# Patient Record
Sex: Male | Born: 1935 | Race: Black or African American | Hispanic: No | Marital: Married | Smoking: Former smoker
Health system: Southern US, Community
[De-identification: ages and names within clinical notes are randomized; demographics above are authoritative.]

## PROBLEM LIST (undated history)

## (undated) DIAGNOSIS — R471 Dysarthria and anarthria: Secondary | ICD-10-CM

## (undated) DIAGNOSIS — J449 Chronic obstructive pulmonary disease, unspecified: Secondary | ICD-10-CM

## (undated) DIAGNOSIS — Z720 Tobacco use: Secondary | ICD-10-CM

## (undated) DIAGNOSIS — I739 Peripheral vascular disease, unspecified: Secondary | ICD-10-CM

## (undated) DIAGNOSIS — E785 Hyperlipidemia, unspecified: Secondary | ICD-10-CM

## (undated) DIAGNOSIS — R0603 Acute respiratory distress: Secondary | ICD-10-CM

## (undated) DIAGNOSIS — F4310 Post-traumatic stress disorder, unspecified: Secondary | ICD-10-CM

## (undated) DIAGNOSIS — N400 Enlarged prostate without lower urinary tract symptoms: Secondary | ICD-10-CM

## (undated) DIAGNOSIS — N189 Chronic kidney disease, unspecified: Secondary | ICD-10-CM

## (undated) DIAGNOSIS — I10 Essential (primary) hypertension: Secondary | ICD-10-CM

## (undated) DIAGNOSIS — I2510 Atherosclerotic heart disease of native coronary artery without angina pectoris: Secondary | ICD-10-CM

## (undated) HISTORY — DX: Post-traumatic stress disorder, unspecified: F43.10

## (undated) HISTORY — DX: Tobacco use: Z72.0

## (undated) HISTORY — DX: Hyperlipidemia, unspecified: E78.5

## (undated) HISTORY — DX: Atherosclerotic heart disease of native coronary artery without angina pectoris: I25.10

## (undated) HISTORY — DX: Chronic obstructive pulmonary disease, unspecified: J44.9

## (undated) HISTORY — DX: Essential (primary) hypertension: I10

## (undated) HISTORY — PX: PROSTATE SURGERY: SHX751

## (undated) HISTORY — DX: Peripheral vascular disease, unspecified: I73.9

## (undated) HISTORY — DX: Chronic kidney disease, unspecified: N18.9

---

## 2012-12-31 ENCOUNTER — Ambulatory Visit (INDEPENDENT_AMBULATORY_CARE_PROVIDER_SITE_OTHER): Payer: PRIVATE HEALTH INSURANCE | Admitting: Cardiovascular Disease

## 2012-12-31 ENCOUNTER — Encounter: Payer: Self-pay | Admitting: Cardiovascular Disease

## 2012-12-31 VITALS — BP 162/75 | HR 71 | Ht 72.0 in | Wt 137.0 lb

## 2012-12-31 DIAGNOSIS — I739 Peripheral vascular disease, unspecified: Secondary | ICD-10-CM | POA: Insufficient documentation

## 2012-12-31 DIAGNOSIS — E785 Hyperlipidemia, unspecified: Secondary | ICD-10-CM

## 2012-12-31 NOTE — Assessment & Plan Note (Signed)
The patient was referred by Arnette Felts PA-C at [facility] medical for dilation of PAD. He complains of right ankle pain but denies claudication. He had a lower extremity arterial Dopplers recently performed at [facility] medical revealing ABI 0.5 range bilaterally with occluded SFAs. On exam his Bifemoral bruits absent pedal pulses. There is no evidence of skin breakdown or critical limb ischemia. Based on his the absence of symptoms of claudication and the atypical nature of his ankle pain I do not think this is vascular in nature nor do I take it requires further evaluation from a vascular point of view.

## 2012-12-31 NOTE — Progress Notes (Signed)
12/31/2012 Chase Johnson.   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 His risk factors include 70-pack-years of tobacco abuse having quit one month ago and treated hyperlipidemia. He's never had a heart attack or stroke and denies chest pain or shortness of breath. He does complain of right complaint pain but denies claudication. Arterial Doppler studies done in the office revealed an ABI of 0.5 bilaterally with occluded SFAs.   Current Outpatient Prescriptions  Medication Sig Dispense Refill  . ALBUTEROL IN Inhale into the lungs as needed.      . budesonide-formoterol (SYMBICORT) 160-4.5 MCG/ACT inhaler Inhale 2 puffs into the lungs 2 (two) times daily.      . clonazePAM (KLONOPIN) 1 MG tablet Take 1 mg by mouth at bedtime.      . hydrochlorothiazide (MICROZIDE) 12.5 MG capsule Take 12.5 mg by mouth daily.      . isosorbide mononitrate (IMDUR) 30 MG 24 hr tablet Take 30 mg by mouth daily.      . nitroGLYCERIN (NITROSTAT) 0.4 MG SL tablet Place 0.4 mg under the tongue every 5 (five) minutes as needed for chest pain.      . simvastatin (ZOCOR) 20 MG tablet Take 20 mg by mouth daily.      . tamsulosin (FLOMAX) 0.4 MG CAPS capsule Take 0.4 mg by mouth daily after supper.      . Vitamin D, Ergocalciferol, (DRISDOL) 50000 UNITS CAPS capsule Take 50,000 Units by mouth every 7 (seven) days.      Marland Kitchen zolpidem (AMBIEN CR) 12.5 MG CR tablet Take 12.5 mg by mouth at bedtime as needed for sleep.       No current facility-administered medications for this visit.    Not on File  History   Social History  . Marital Status: Married    Spouse  Name: N/A    Number of Children: N/A  . Years of Education: N/A   Occupational History  . Not on file.   Social History Main Topics  . Smoking status: Former Smoker    Types: Cigarettes    Quit date: 12/01/2012  . Smokeless tobacco: Not on file  . Alcohol Use: No  . Drug Use: Not on file  . Sexual Activity: Not on file   Other Topics Concern  . Not on file   Social History Narrative  . No narrative on file     Review of Systems: General: negative for chills, fever, night sweats or weight changes.  Cardiovascular: negative for chest pain, dyspnea on exertion, edema, orthopnea, palpitations, paroxysmal nocturnal dyspnea or shortness of breath Dermatological: negative for rash Respiratory: negative for cough or wheezing Urologic: negative for hematuria Abdominal: negative for nausea, vomiting, diarrhea, bright red blood per rectum, melena, or hematemesis Neurologic: negative for visual changes, syncope, or dizziness All other systems reviewed and are otherwise negative except as noted above.    Blood pressure 162/75, pulse 71, height 6' (1.829 m), weight 137 lb (62.143 kg).  General appearance: alert and no distress Neck: no adenopathy, no carotid bruit, no JVD, supple, symmetrical, trachea midline and thyroid not enlarged, symmetric, no tenderness/mass/nodules Lungs: clear to auscultation bilaterally Heart: regular rate  and rhythm, S1, S2 normal, no murmur, click, rub or gallop Abdomen: soft, non-tender; bowel sounds normal; no masses,  no organomegaly Extremities: extremities normal, atraumatic, no cyanosis or edema Pulses: 2+ and symmetric 2+ femorals with bruits bilaterally, absent pedal pulses  EKG not performed today  ASSESSMENT AND PLAN:   Peripheral arterial disease The patient was referred by Arnette Felts PA-C at [facility] medical for dilation of PAD. He complains of right ankle pain but denies claudication. He had a lower extremity arterial Dopplers recently  performed at [facility] medical revealing ABI 0.5 range bilaterally with occluded SFAs. On exam his Bifemoral bruits absent pedal pulses. There is no evidence of skin breakdown or critical limb ischemia. Based on his the absence of symptoms of claudication and the atypical nature of his ankle pain I do not think this is vascular in nature nor do I take it requires further evaluation from a vascular point of view.      Runell Gess MD FACP,FACC,FAHA, [facility] 12/31/2012 3:25 PM

## 2012-12-31 NOTE — Patient Instructions (Signed)
Dr Berry recommends that you follow-up with him on an as needed basis. 

## 2013-01-13 ENCOUNTER — Encounter: Payer: Self-pay | Admitting: Cardiovascular Disease

## 2014-01-19 DIAGNOSIS — J449 Chronic obstructive pulmonary disease, unspecified: Secondary | ICD-10-CM | POA: Diagnosis not present

## 2014-01-19 DIAGNOSIS — R0602 Shortness of breath: Secondary | ICD-10-CM | POA: Diagnosis not present

## 2014-01-19 DIAGNOSIS — G4733 Obstructive sleep apnea (adult) (pediatric): Secondary | ICD-10-CM | POA: Diagnosis not present

## 2014-01-19 DIAGNOSIS — R918 Other nonspecific abnormal finding of lung field: Secondary | ICD-10-CM | POA: Diagnosis not present

## 2014-01-20 DIAGNOSIS — R918 Other nonspecific abnormal finding of lung field: Secondary | ICD-10-CM | POA: Diagnosis not present

## 2014-01-27 DIAGNOSIS — J449 Chronic obstructive pulmonary disease, unspecified: Secondary | ICD-10-CM | POA: Diagnosis not present

## 2014-01-27 DIAGNOSIS — R918 Other nonspecific abnormal finding of lung field: Secondary | ICD-10-CM | POA: Diagnosis not present

## 2014-01-27 DIAGNOSIS — I2510 Atherosclerotic heart disease of native coronary artery without angina pectoris: Secondary | ICD-10-CM | POA: Diagnosis not present

## 2014-02-04 DIAGNOSIS — E782 Mixed hyperlipidemia: Secondary | ICD-10-CM | POA: Diagnosis not present

## 2014-02-04 DIAGNOSIS — I10 Essential (primary) hypertension: Secondary | ICD-10-CM | POA: Diagnosis not present

## 2014-02-04 DIAGNOSIS — Z79899 Other long term (current) drug therapy: Secondary | ICD-10-CM | POA: Diagnosis not present

## 2014-02-04 DIAGNOSIS — R0602 Shortness of breath: Secondary | ICD-10-CM | POA: Diagnosis not present

## 2014-02-04 DIAGNOSIS — G4700 Insomnia, unspecified: Secondary | ICD-10-CM | POA: Diagnosis not present

## 2014-02-08 DIAGNOSIS — I252 Old myocardial infarction: Secondary | ICD-10-CM | POA: Diagnosis not present

## 2014-02-08 DIAGNOSIS — J440 Chronic obstructive pulmonary disease with acute lower respiratory infection: Secondary | ICD-10-CM | POA: Diagnosis not present

## 2014-02-08 DIAGNOSIS — F17200 Nicotine dependence, unspecified, uncomplicated: Secondary | ICD-10-CM | POA: Diagnosis not present

## 2014-02-08 DIAGNOSIS — G4700 Insomnia, unspecified: Secondary | ICD-10-CM | POA: Diagnosis present

## 2014-02-08 DIAGNOSIS — R05 Cough: Secondary | ICD-10-CM | POA: Diagnosis not present

## 2014-02-08 DIAGNOSIS — E785 Hyperlipidemia, unspecified: Secondary | ICD-10-CM | POA: Diagnosis present

## 2014-02-08 DIAGNOSIS — R0600 Dyspnea, unspecified: Secondary | ICD-10-CM | POA: Diagnosis not present

## 2014-02-08 DIAGNOSIS — J9612 Chronic respiratory failure with hypercapnia: Secondary | ICD-10-CM | POA: Diagnosis not present

## 2014-02-08 DIAGNOSIS — I2781 Cor pulmonale (chronic): Secondary | ICD-10-CM | POA: Diagnosis not present

## 2014-02-08 DIAGNOSIS — Z8673 Personal history of transient ischemic attack (TIA), and cerebral infarction without residual deficits: Secondary | ICD-10-CM | POA: Diagnosis not present

## 2014-02-08 DIAGNOSIS — R2242 Localized swelling, mass and lump, left lower limb: Secondary | ICD-10-CM | POA: Diagnosis not present

## 2014-02-08 DIAGNOSIS — J449 Chronic obstructive pulmonary disease, unspecified: Secondary | ICD-10-CM | POA: Diagnosis not present

## 2014-02-08 DIAGNOSIS — R609 Edema, unspecified: Secondary | ICD-10-CM | POA: Diagnosis not present

## 2014-02-08 DIAGNOSIS — J441 Chronic obstructive pulmonary disease with (acute) exacerbation: Secondary | ICD-10-CM | POA: Diagnosis not present

## 2014-02-08 DIAGNOSIS — F419 Anxiety disorder, unspecified: Secondary | ICD-10-CM | POA: Diagnosis present

## 2014-02-08 DIAGNOSIS — K219 Gastro-esophageal reflux disease without esophagitis: Secondary | ICD-10-CM | POA: Diagnosis present

## 2014-02-08 DIAGNOSIS — N179 Acute kidney failure, unspecified: Secondary | ICD-10-CM | POA: Diagnosis not present

## 2014-02-08 DIAGNOSIS — J209 Acute bronchitis, unspecified: Secondary | ICD-10-CM | POA: Diagnosis present

## 2014-02-08 DIAGNOSIS — I10 Essential (primary) hypertension: Secondary | ICD-10-CM | POA: Diagnosis present

## 2014-02-08 DIAGNOSIS — F17210 Nicotine dependence, cigarettes, uncomplicated: Secondary | ICD-10-CM | POA: Diagnosis present

## 2014-02-08 DIAGNOSIS — I739 Peripheral vascular disease, unspecified: Secondary | ICD-10-CM | POA: Diagnosis present

## 2014-02-08 DIAGNOSIS — J9621 Acute and chronic respiratory failure with hypoxia: Secondary | ICD-10-CM | POA: Diagnosis not present

## 2014-02-08 DIAGNOSIS — M79605 Pain in left leg: Secondary | ICD-10-CM | POA: Diagnosis not present

## 2014-02-08 DIAGNOSIS — R0602 Shortness of breath: Secondary | ICD-10-CM | POA: Diagnosis not present

## 2014-02-24 DIAGNOSIS — R600 Localized edema: Secondary | ICD-10-CM | POA: Diagnosis not present

## 2014-02-24 DIAGNOSIS — E782 Mixed hyperlipidemia: Secondary | ICD-10-CM | POA: Diagnosis not present

## 2014-02-24 DIAGNOSIS — I10 Essential (primary) hypertension: Secondary | ICD-10-CM | POA: Diagnosis not present

## 2014-02-24 DIAGNOSIS — R0602 Shortness of breath: Secondary | ICD-10-CM | POA: Diagnosis not present

## 2014-02-28 DIAGNOSIS — I10 Essential (primary) hypertension: Secondary | ICD-10-CM | POA: Diagnosis not present

## 2014-02-28 DIAGNOSIS — I252 Old myocardial infarction: Secondary | ICD-10-CM | POA: Diagnosis not present

## 2014-02-28 DIAGNOSIS — J449 Chronic obstructive pulmonary disease, unspecified: Secondary | ICD-10-CM | POA: Diagnosis not present

## 2014-02-28 DIAGNOSIS — I499 Cardiac arrhythmia, unspecified: Secondary | ICD-10-CM | POA: Diagnosis not present

## 2014-02-28 DIAGNOSIS — R2242 Localized swelling, mass and lump, left lower limb: Secondary | ICD-10-CM | POA: Diagnosis not present

## 2014-02-28 DIAGNOSIS — E785 Hyperlipidemia, unspecified: Secondary | ICD-10-CM | POA: Diagnosis not present

## 2014-02-28 DIAGNOSIS — I2781 Cor pulmonale (chronic): Secondary | ICD-10-CM | POA: Diagnosis not present

## 2014-02-28 DIAGNOSIS — Z87820 Personal history of traumatic brain injury: Secondary | ICD-10-CM | POA: Diagnosis not present

## 2014-02-28 DIAGNOSIS — R609 Edema, unspecified: Secondary | ICD-10-CM | POA: Diagnosis not present

## 2014-03-09 DIAGNOSIS — J439 Emphysema, unspecified: Secondary | ICD-10-CM | POA: Diagnosis not present

## 2014-03-09 DIAGNOSIS — I509 Heart failure, unspecified: Secondary | ICD-10-CM | POA: Diagnosis not present

## 2014-03-09 DIAGNOSIS — R600 Localized edema: Secondary | ICD-10-CM | POA: Diagnosis not present

## 2014-03-13 DIAGNOSIS — R54 Age-related physical debility: Secondary | ICD-10-CM | POA: Diagnosis not present

## 2014-03-13 DIAGNOSIS — M19072 Primary osteoarthritis, left ankle and foot: Secondary | ICD-10-CM | POA: Diagnosis not present

## 2014-03-13 DIAGNOSIS — R0602 Shortness of breath: Secondary | ICD-10-CM | POA: Diagnosis not present

## 2014-03-13 DIAGNOSIS — M109 Gout, unspecified: Secondary | ICD-10-CM | POA: Diagnosis present

## 2014-03-13 DIAGNOSIS — E870 Hyperosmolality and hypernatremia: Secondary | ICD-10-CM | POA: Diagnosis present

## 2014-03-13 DIAGNOSIS — I5020 Unspecified systolic (congestive) heart failure: Secondary | ICD-10-CM | POA: Diagnosis not present

## 2014-03-13 DIAGNOSIS — I129 Hypertensive chronic kidney disease with stage 1 through stage 4 chronic kidney disease, or unspecified chronic kidney disease: Secondary | ICD-10-CM | POA: Diagnosis present

## 2014-03-13 DIAGNOSIS — N179 Acute kidney failure, unspecified: Secondary | ICD-10-CM | POA: Diagnosis not present

## 2014-03-13 DIAGNOSIS — N189 Chronic kidney disease, unspecified: Secondary | ICD-10-CM | POA: Diagnosis not present

## 2014-03-13 DIAGNOSIS — I5021 Acute systolic (congestive) heart failure: Secondary | ICD-10-CM | POA: Diagnosis not present

## 2014-03-13 DIAGNOSIS — I739 Peripheral vascular disease, unspecified: Secondary | ICD-10-CM | POA: Diagnosis present

## 2014-03-13 DIAGNOSIS — J9811 Atelectasis: Secondary | ICD-10-CM | POA: Diagnosis not present

## 2014-03-13 DIAGNOSIS — J9622 Acute and chronic respiratory failure with hypercapnia: Secondary | ICD-10-CM | POA: Diagnosis not present

## 2014-03-13 DIAGNOSIS — I10 Essential (primary) hypertension: Secondary | ICD-10-CM | POA: Diagnosis not present

## 2014-03-13 DIAGNOSIS — J441 Chronic obstructive pulmonary disease with (acute) exacerbation: Secondary | ICD-10-CM | POA: Diagnosis present

## 2014-03-13 DIAGNOSIS — M25579 Pain in unspecified ankle and joints of unspecified foot: Secondary | ICD-10-CM | POA: Diagnosis not present

## 2014-03-13 DIAGNOSIS — I509 Heart failure, unspecified: Secondary | ICD-10-CM | POA: Diagnosis not present

## 2014-03-13 DIAGNOSIS — Z8673 Personal history of transient ischemic attack (TIA), and cerebral infarction without residual deficits: Secondary | ICD-10-CM | POA: Diagnosis not present

## 2014-03-13 DIAGNOSIS — Z8711 Personal history of peptic ulcer disease: Secondary | ICD-10-CM | POA: Diagnosis not present

## 2014-03-13 DIAGNOSIS — G9340 Encephalopathy, unspecified: Secondary | ICD-10-CM | POA: Diagnosis not present

## 2014-03-13 DIAGNOSIS — J9620 Acute and chronic respiratory failure, unspecified whether with hypoxia or hypercapnia: Secondary | ICD-10-CM | POA: Diagnosis not present

## 2014-03-13 DIAGNOSIS — R5381 Other malaise: Secondary | ICD-10-CM | POA: Diagnosis present

## 2014-03-13 DIAGNOSIS — J189 Pneumonia, unspecified organism: Secondary | ICD-10-CM | POA: Diagnosis not present

## 2014-03-13 DIAGNOSIS — J90 Pleural effusion, not elsewhere classified: Secondary | ICD-10-CM | POA: Diagnosis not present

## 2014-03-13 DIAGNOSIS — N400 Enlarged prostate without lower urinary tract symptoms: Secondary | ICD-10-CM | POA: Diagnosis not present

## 2014-03-13 DIAGNOSIS — G9341 Metabolic encephalopathy: Secondary | ICD-10-CM | POA: Diagnosis not present

## 2014-03-13 DIAGNOSIS — R4182 Altered mental status, unspecified: Secondary | ICD-10-CM | POA: Diagnosis not present

## 2014-03-13 DIAGNOSIS — J9621 Acute and chronic respiratory failure with hypoxia: Secondary | ICD-10-CM | POA: Diagnosis not present

## 2014-03-13 DIAGNOSIS — I272 Other secondary pulmonary hypertension: Secondary | ICD-10-CM | POA: Diagnosis present

## 2014-03-13 DIAGNOSIS — E785 Hyperlipidemia, unspecified: Secondary | ICD-10-CM | POA: Diagnosis not present

## 2014-03-13 DIAGNOSIS — N183 Chronic kidney disease, stage 3 (moderate): Secondary | ICD-10-CM | POA: Diagnosis present

## 2014-03-13 DIAGNOSIS — M25572 Pain in left ankle and joints of left foot: Secondary | ICD-10-CM | POA: Diagnosis present

## 2014-03-13 DIAGNOSIS — J9600 Acute respiratory failure, unspecified whether with hypoxia or hypercapnia: Secondary | ICD-10-CM | POA: Diagnosis not present

## 2014-03-13 DIAGNOSIS — Z9981 Dependence on supplemental oxygen: Secondary | ICD-10-CM | POA: Diagnosis not present

## 2014-03-13 DIAGNOSIS — J449 Chronic obstructive pulmonary disease, unspecified: Secondary | ICD-10-CM | POA: Diagnosis not present

## 2014-03-13 DIAGNOSIS — I2510 Atherosclerotic heart disease of native coronary artery without angina pectoris: Secondary | ICD-10-CM | POA: Diagnosis not present

## 2014-03-19 DIAGNOSIS — E870 Hyperosmolality and hypernatremia: Secondary | ICD-10-CM | POA: Diagnosis not present

## 2014-03-19 DIAGNOSIS — F419 Anxiety disorder, unspecified: Secondary | ICD-10-CM | POA: Diagnosis present

## 2014-03-19 DIAGNOSIS — M25579 Pain in unspecified ankle and joints of unspecified foot: Secondary | ICD-10-CM | POA: Diagnosis not present

## 2014-03-19 DIAGNOSIS — I10 Essential (primary) hypertension: Secondary | ICD-10-CM | POA: Diagnosis not present

## 2014-03-19 DIAGNOSIS — Z9981 Dependence on supplemental oxygen: Secondary | ICD-10-CM | POA: Diagnosis not present

## 2014-03-19 DIAGNOSIS — J209 Acute bronchitis, unspecified: Secondary | ICD-10-CM | POA: Diagnosis present

## 2014-03-19 DIAGNOSIS — G9341 Metabolic encephalopathy: Secondary | ICD-10-CM | POA: Diagnosis present

## 2014-03-19 DIAGNOSIS — I5033 Acute on chronic diastolic (congestive) heart failure: Secondary | ICD-10-CM | POA: Diagnosis present

## 2014-03-19 DIAGNOSIS — E872 Acidosis: Secondary | ICD-10-CM | POA: Diagnosis present

## 2014-03-19 DIAGNOSIS — J9600 Acute respiratory failure, unspecified whether with hypoxia or hypercapnia: Secondary | ICD-10-CM | POA: Diagnosis not present

## 2014-03-19 DIAGNOSIS — E46 Unspecified protein-calorie malnutrition: Secondary | ICD-10-CM | POA: Diagnosis present

## 2014-03-19 DIAGNOSIS — Z681 Body mass index (BMI) 19 or less, adult: Secondary | ICD-10-CM | POA: Diagnosis not present

## 2014-03-19 DIAGNOSIS — I2510 Atherosclerotic heart disease of native coronary artery without angina pectoris: Secondary | ICD-10-CM | POA: Diagnosis not present

## 2014-03-19 DIAGNOSIS — J441 Chronic obstructive pulmonary disease with (acute) exacerbation: Secondary | ICD-10-CM | POA: Diagnosis present

## 2014-03-19 DIAGNOSIS — R0789 Other chest pain: Secondary | ICD-10-CM | POA: Diagnosis present

## 2014-03-19 DIAGNOSIS — N189 Chronic kidney disease, unspecified: Secondary | ICD-10-CM | POA: Diagnosis not present

## 2014-03-19 DIAGNOSIS — J9620 Acute and chronic respiratory failure, unspecified whether with hypoxia or hypercapnia: Secondary | ICD-10-CM | POA: Diagnosis not present

## 2014-03-19 DIAGNOSIS — J440 Chronic obstructive pulmonary disease with acute lower respiratory infection: Secondary | ICD-10-CM | POA: Diagnosis present

## 2014-03-19 DIAGNOSIS — J9602 Acute respiratory failure with hypercapnia: Secondary | ICD-10-CM | POA: Diagnosis not present

## 2014-03-19 DIAGNOSIS — T501X5A Adverse effect of loop [high-ceiling] diuretics, initial encounter: Secondary | ICD-10-CM | POA: Diagnosis not present

## 2014-03-19 DIAGNOSIS — J80 Acute respiratory distress syndrome: Secondary | ICD-10-CM | POA: Diagnosis not present

## 2014-03-19 DIAGNOSIS — I5020 Unspecified systolic (congestive) heart failure: Secondary | ICD-10-CM | POA: Diagnosis not present

## 2014-03-19 DIAGNOSIS — I739 Peripheral vascular disease, unspecified: Secondary | ICD-10-CM | POA: Diagnosis present

## 2014-03-19 DIAGNOSIS — M549 Dorsalgia, unspecified: Secondary | ICD-10-CM | POA: Diagnosis not present

## 2014-03-19 DIAGNOSIS — R109 Unspecified abdominal pain: Secondary | ICD-10-CM | POA: Diagnosis not present

## 2014-03-19 DIAGNOSIS — J9601 Acute respiratory failure with hypoxia: Secondary | ICD-10-CM | POA: Diagnosis not present

## 2014-03-19 DIAGNOSIS — J189 Pneumonia, unspecified organism: Secondary | ICD-10-CM | POA: Diagnosis not present

## 2014-03-19 DIAGNOSIS — F17210 Nicotine dependence, cigarettes, uncomplicated: Secondary | ICD-10-CM | POA: Diagnosis present

## 2014-03-19 DIAGNOSIS — J9622 Acute and chronic respiratory failure with hypercapnia: Secondary | ICD-10-CM | POA: Diagnosis present

## 2014-03-19 DIAGNOSIS — R4182 Altered mental status, unspecified: Secondary | ICD-10-CM | POA: Diagnosis not present

## 2014-03-19 DIAGNOSIS — Z8673 Personal history of transient ischemic attack (TIA), and cerebral infarction without residual deficits: Secondary | ICD-10-CM | POA: Diagnosis not present

## 2014-03-19 DIAGNOSIS — E875 Hyperkalemia: Secondary | ICD-10-CM | POA: Diagnosis present

## 2014-03-19 DIAGNOSIS — Z8701 Personal history of pneumonia (recurrent): Secondary | ICD-10-CM | POA: Diagnosis not present

## 2014-03-19 DIAGNOSIS — I252 Old myocardial infarction: Secondary | ICD-10-CM | POA: Diagnosis not present

## 2014-03-19 DIAGNOSIS — N179 Acute kidney failure, unspecified: Secondary | ICD-10-CM | POA: Diagnosis not present

## 2014-03-19 DIAGNOSIS — I129 Hypertensive chronic kidney disease with stage 1 through stage 4 chronic kidney disease, or unspecified chronic kidney disease: Secondary | ICD-10-CM | POA: Diagnosis present

## 2014-03-19 DIAGNOSIS — I509 Heart failure, unspecified: Secondary | ICD-10-CM | POA: Diagnosis not present

## 2014-03-19 DIAGNOSIS — N400 Enlarged prostate without lower urinary tract symptoms: Secondary | ICD-10-CM | POA: Diagnosis present

## 2014-03-19 DIAGNOSIS — N183 Chronic kidney disease, stage 3 (moderate): Secondary | ICD-10-CM | POA: Diagnosis present

## 2014-03-19 DIAGNOSIS — R069 Unspecified abnormalities of breathing: Secondary | ICD-10-CM | POA: Diagnosis not present

## 2014-03-19 DIAGNOSIS — R5381 Other malaise: Secondary | ICD-10-CM | POA: Diagnosis present

## 2014-03-19 DIAGNOSIS — R0600 Dyspnea, unspecified: Secondary | ICD-10-CM | POA: Diagnosis not present

## 2014-03-19 DIAGNOSIS — Z8711 Personal history of peptic ulcer disease: Secondary | ICD-10-CM | POA: Diagnosis not present

## 2014-03-19 DIAGNOSIS — E785 Hyperlipidemia, unspecified: Secondary | ICD-10-CM | POA: Diagnosis present

## 2014-03-19 DIAGNOSIS — G9340 Encephalopathy, unspecified: Secondary | ICD-10-CM | POA: Diagnosis not present

## 2014-03-19 DIAGNOSIS — R54 Age-related physical debility: Secondary | ICD-10-CM | POA: Diagnosis not present

## 2014-03-20 DIAGNOSIS — Z681 Body mass index (BMI) 19 or less, adult: Secondary | ICD-10-CM | POA: Diagnosis not present

## 2014-03-20 DIAGNOSIS — E870 Hyperosmolality and hypernatremia: Secondary | ICD-10-CM | POA: Diagnosis not present

## 2014-03-20 DIAGNOSIS — J9601 Acute respiratory failure with hypoxia: Secondary | ICD-10-CM | POA: Diagnosis not present

## 2014-03-20 DIAGNOSIS — F419 Anxiety disorder, unspecified: Secondary | ICD-10-CM | POA: Diagnosis present

## 2014-03-20 DIAGNOSIS — I252 Old myocardial infarction: Secondary | ICD-10-CM | POA: Diagnosis not present

## 2014-03-20 DIAGNOSIS — J189 Pneumonia, unspecified organism: Secondary | ICD-10-CM | POA: Diagnosis not present

## 2014-03-20 DIAGNOSIS — D649 Anemia, unspecified: Secondary | ICD-10-CM | POA: Diagnosis not present

## 2014-03-20 DIAGNOSIS — D696 Thrombocytopenia, unspecified: Secondary | ICD-10-CM | POA: Diagnosis not present

## 2014-03-20 DIAGNOSIS — J209 Acute bronchitis, unspecified: Secondary | ICD-10-CM | POA: Diagnosis present

## 2014-03-20 DIAGNOSIS — I5020 Unspecified systolic (congestive) heart failure: Secondary | ICD-10-CM | POA: Diagnosis not present

## 2014-03-20 DIAGNOSIS — N400 Enlarged prostate without lower urinary tract symptoms: Secondary | ICD-10-CM | POA: Diagnosis present

## 2014-03-20 DIAGNOSIS — R0600 Dyspnea, unspecified: Secondary | ICD-10-CM | POA: Diagnosis not present

## 2014-03-20 DIAGNOSIS — J9621 Acute and chronic respiratory failure with hypoxia: Secondary | ICD-10-CM | POA: Diagnosis not present

## 2014-03-20 DIAGNOSIS — T501X5A Adverse effect of loop [high-ceiling] diuretics, initial encounter: Secondary | ICD-10-CM | POA: Diagnosis not present

## 2014-03-20 DIAGNOSIS — J9620 Acute and chronic respiratory failure, unspecified whether with hypoxia or hypercapnia: Secondary | ICD-10-CM | POA: Diagnosis not present

## 2014-03-20 DIAGNOSIS — E46 Unspecified protein-calorie malnutrition: Secondary | ICD-10-CM | POA: Diagnosis present

## 2014-03-20 DIAGNOSIS — R079 Chest pain, unspecified: Secondary | ICD-10-CM | POA: Diagnosis not present

## 2014-03-20 DIAGNOSIS — J449 Chronic obstructive pulmonary disease, unspecified: Secondary | ICD-10-CM | POA: Diagnosis not present

## 2014-03-20 DIAGNOSIS — Z8701 Personal history of pneumonia (recurrent): Secondary | ICD-10-CM | POA: Diagnosis not present

## 2014-03-20 DIAGNOSIS — I129 Hypertensive chronic kidney disease with stage 1 through stage 4 chronic kidney disease, or unspecified chronic kidney disease: Secondary | ICD-10-CM | POA: Diagnosis present

## 2014-03-20 DIAGNOSIS — R069 Unspecified abnormalities of breathing: Secondary | ICD-10-CM | POA: Diagnosis not present

## 2014-03-20 DIAGNOSIS — J80 Acute respiratory distress syndrome: Secondary | ICD-10-CM | POA: Diagnosis not present

## 2014-03-20 DIAGNOSIS — I82A21 Chronic embolism and thrombosis of right axillary vein: Secondary | ICD-10-CM | POA: Diagnosis not present

## 2014-03-20 DIAGNOSIS — J9602 Acute respiratory failure with hypercapnia: Secondary | ICD-10-CM | POA: Diagnosis not present

## 2014-03-20 DIAGNOSIS — R4182 Altered mental status, unspecified: Secondary | ICD-10-CM | POA: Diagnosis not present

## 2014-03-20 DIAGNOSIS — E872 Acidosis: Secondary | ICD-10-CM | POA: Diagnosis present

## 2014-03-20 DIAGNOSIS — I509 Heart failure, unspecified: Secondary | ICD-10-CM | POA: Diagnosis not present

## 2014-03-20 DIAGNOSIS — I10 Essential (primary) hypertension: Secondary | ICD-10-CM | POA: Diagnosis not present

## 2014-03-20 DIAGNOSIS — M6281 Muscle weakness (generalized): Secondary | ICD-10-CM | POA: Diagnosis not present

## 2014-03-20 DIAGNOSIS — I5033 Acute on chronic diastolic (congestive) heart failure: Secondary | ICD-10-CM | POA: Diagnosis not present

## 2014-03-20 DIAGNOSIS — J441 Chronic obstructive pulmonary disease with (acute) exacerbation: Secondary | ICD-10-CM | POA: Diagnosis not present

## 2014-03-20 DIAGNOSIS — E875 Hyperkalemia: Secondary | ICD-10-CM | POA: Diagnosis present

## 2014-03-20 DIAGNOSIS — N179 Acute kidney failure, unspecified: Secondary | ICD-10-CM | POA: Diagnosis not present

## 2014-03-20 DIAGNOSIS — G9341 Metabolic encephalopathy: Secondary | ICD-10-CM | POA: Diagnosis present

## 2014-03-20 DIAGNOSIS — K219 Gastro-esophageal reflux disease without esophagitis: Secondary | ICD-10-CM | POA: Diagnosis not present

## 2014-03-20 DIAGNOSIS — E874 Mixed disorder of acid-base balance: Secondary | ICD-10-CM | POA: Diagnosis not present

## 2014-03-20 DIAGNOSIS — R609 Edema, unspecified: Secondary | ICD-10-CM | POA: Diagnosis not present

## 2014-03-20 DIAGNOSIS — M549 Dorsalgia, unspecified: Secondary | ICD-10-CM | POA: Diagnosis not present

## 2014-03-20 DIAGNOSIS — R109 Unspecified abdominal pain: Secondary | ICD-10-CM | POA: Diagnosis not present

## 2014-03-20 DIAGNOSIS — J9600 Acute respiratory failure, unspecified whether with hypoxia or hypercapnia: Secondary | ICD-10-CM | POA: Diagnosis not present

## 2014-03-20 DIAGNOSIS — R0602 Shortness of breath: Secondary | ICD-10-CM | POA: Diagnosis not present

## 2014-03-20 DIAGNOSIS — R5381 Other malaise: Secondary | ICD-10-CM | POA: Diagnosis present

## 2014-03-20 DIAGNOSIS — N183 Chronic kidney disease, stage 3 (moderate): Secondary | ICD-10-CM | POA: Diagnosis present

## 2014-03-20 DIAGNOSIS — F17210 Nicotine dependence, cigarettes, uncomplicated: Secondary | ICD-10-CM | POA: Diagnosis present

## 2014-03-20 DIAGNOSIS — Z79899 Other long term (current) drug therapy: Secondary | ICD-10-CM | POA: Diagnosis not present

## 2014-03-20 DIAGNOSIS — R0789 Other chest pain: Secondary | ICD-10-CM | POA: Diagnosis present

## 2014-03-20 DIAGNOSIS — Z9981 Dependence on supplemental oxygen: Secondary | ICD-10-CM | POA: Diagnosis not present

## 2014-03-20 DIAGNOSIS — I491 Atrial premature depolarization: Secondary | ICD-10-CM | POA: Diagnosis not present

## 2014-03-20 DIAGNOSIS — E785 Hyperlipidemia, unspecified: Secondary | ICD-10-CM | POA: Diagnosis present

## 2014-03-20 DIAGNOSIS — I739 Peripheral vascular disease, unspecified: Secondary | ICD-10-CM | POA: Diagnosis present

## 2014-03-20 DIAGNOSIS — J440 Chronic obstructive pulmonary disease with acute lower respiratory infection: Secondary | ICD-10-CM | POA: Diagnosis not present

## 2014-03-20 DIAGNOSIS — J9622 Acute and chronic respiratory failure with hypercapnia: Secondary | ICD-10-CM | POA: Diagnosis not present

## 2014-03-31 DIAGNOSIS — J441 Chronic obstructive pulmonary disease with (acute) exacerbation: Secondary | ICD-10-CM | POA: Diagnosis not present

## 2014-03-31 DIAGNOSIS — Z9981 Dependence on supplemental oxygen: Secondary | ICD-10-CM | POA: Diagnosis not present

## 2014-03-31 DIAGNOSIS — D649 Anemia, unspecified: Secondary | ICD-10-CM | POA: Diagnosis not present

## 2014-03-31 DIAGNOSIS — D696 Thrombocytopenia, unspecified: Secondary | ICD-10-CM | POA: Diagnosis not present

## 2014-03-31 DIAGNOSIS — J9601 Acute respiratory failure with hypoxia: Secondary | ICD-10-CM | POA: Diagnosis not present

## 2014-03-31 DIAGNOSIS — R0602 Shortness of breath: Secondary | ICD-10-CM | POA: Diagnosis not present

## 2014-03-31 DIAGNOSIS — J9621 Acute and chronic respiratory failure with hypoxia: Secondary | ICD-10-CM | POA: Diagnosis not present

## 2014-03-31 DIAGNOSIS — I82A21 Chronic embolism and thrombosis of right axillary vein: Secondary | ICD-10-CM | POA: Diagnosis not present

## 2014-04-01 DIAGNOSIS — J209 Acute bronchitis, unspecified: Secondary | ICD-10-CM | POA: Diagnosis not present

## 2014-04-01 DIAGNOSIS — J9602 Acute respiratory failure with hypercapnia: Secondary | ICD-10-CM | POA: Diagnosis not present

## 2014-04-01 DIAGNOSIS — I82A29 Chronic embolism and thrombosis of unspecified axillary vein: Secondary | ICD-10-CM | POA: Diagnosis not present

## 2014-04-01 DIAGNOSIS — J449 Chronic obstructive pulmonary disease, unspecified: Secondary | ICD-10-CM | POA: Diagnosis not present

## 2014-04-01 DIAGNOSIS — J441 Chronic obstructive pulmonary disease with (acute) exacerbation: Secondary | ICD-10-CM | POA: Diagnosis not present

## 2014-04-01 DIAGNOSIS — J9621 Acute and chronic respiratory failure with hypoxia: Secondary | ICD-10-CM | POA: Diagnosis not present

## 2014-04-02 DIAGNOSIS — J449 Chronic obstructive pulmonary disease, unspecified: Secondary | ICD-10-CM | POA: Diagnosis not present

## 2014-04-02 DIAGNOSIS — J9602 Acute respiratory failure with hypercapnia: Secondary | ICD-10-CM | POA: Diagnosis not present

## 2014-04-02 DIAGNOSIS — I82A29 Chronic embolism and thrombosis of unspecified axillary vein: Secondary | ICD-10-CM | POA: Diagnosis not present

## 2014-04-02 DIAGNOSIS — J441 Chronic obstructive pulmonary disease with (acute) exacerbation: Secondary | ICD-10-CM | POA: Diagnosis not present

## 2014-04-02 DIAGNOSIS — J209 Acute bronchitis, unspecified: Secondary | ICD-10-CM | POA: Diagnosis not present

## 2014-04-03 DIAGNOSIS — F419 Anxiety disorder, unspecified: Secondary | ICD-10-CM | POA: Diagnosis present

## 2014-04-03 DIAGNOSIS — G9341 Metabolic encephalopathy: Secondary | ICD-10-CM | POA: Diagnosis not present

## 2014-04-03 DIAGNOSIS — J449 Chronic obstructive pulmonary disease, unspecified: Secondary | ICD-10-CM | POA: Diagnosis not present

## 2014-04-03 DIAGNOSIS — Z720 Tobacco use: Secondary | ICD-10-CM | POA: Diagnosis not present

## 2014-04-03 DIAGNOSIS — J9612 Chronic respiratory failure with hypercapnia: Secondary | ICD-10-CM | POA: Diagnosis not present

## 2014-04-03 DIAGNOSIS — I739 Peripheral vascular disease, unspecified: Secondary | ICD-10-CM | POA: Diagnosis not present

## 2014-04-03 DIAGNOSIS — Z9981 Dependence on supplemental oxygen: Secondary | ICD-10-CM | POA: Diagnosis not present

## 2014-04-03 DIAGNOSIS — E785 Hyperlipidemia, unspecified: Secondary | ICD-10-CM | POA: Diagnosis present

## 2014-04-03 DIAGNOSIS — J9610 Chronic respiratory failure, unspecified whether with hypoxia or hypercapnia: Secondary | ICD-10-CM | POA: Diagnosis not present

## 2014-04-03 DIAGNOSIS — E870 Hyperosmolality and hypernatremia: Secondary | ICD-10-CM | POA: Diagnosis present

## 2014-04-03 DIAGNOSIS — Z86718 Personal history of other venous thrombosis and embolism: Secondary | ICD-10-CM | POA: Diagnosis not present

## 2014-04-03 DIAGNOSIS — F17210 Nicotine dependence, cigarettes, uncomplicated: Secondary | ICD-10-CM | POA: Diagnosis present

## 2014-04-03 DIAGNOSIS — I82721 Chronic embolism and thrombosis of deep veins of right upper extremity: Secondary | ICD-10-CM | POA: Diagnosis not present

## 2014-04-03 DIAGNOSIS — M6281 Muscle weakness (generalized): Secondary | ICD-10-CM | POA: Diagnosis present

## 2014-04-03 DIAGNOSIS — E874 Mixed disorder of acid-base balance: Secondary | ICD-10-CM | POA: Diagnosis present

## 2014-04-03 DIAGNOSIS — R5381 Other malaise: Secondary | ICD-10-CM | POA: Diagnosis present

## 2014-04-03 DIAGNOSIS — I82C21 Chronic embolism and thrombosis of right internal jugular vein: Secondary | ICD-10-CM | POA: Diagnosis present

## 2014-04-03 DIAGNOSIS — N19 Unspecified kidney failure: Secondary | ICD-10-CM | POA: Diagnosis not present

## 2014-04-03 DIAGNOSIS — R0602 Shortness of breath: Secondary | ICD-10-CM | POA: Diagnosis not present

## 2014-04-03 DIAGNOSIS — R2689 Other abnormalities of gait and mobility: Secondary | ICD-10-CM | POA: Diagnosis present

## 2014-04-03 DIAGNOSIS — D649 Anemia, unspecified: Secondary | ICD-10-CM | POA: Diagnosis present

## 2014-04-03 DIAGNOSIS — J9622 Acute and chronic respiratory failure with hypercapnia: Secondary | ICD-10-CM | POA: Diagnosis present

## 2014-04-03 DIAGNOSIS — I82A21 Chronic embolism and thrombosis of right axillary vein: Secondary | ICD-10-CM | POA: Diagnosis present

## 2014-04-03 DIAGNOSIS — G92 Toxic encephalopathy: Secondary | ICD-10-CM | POA: Diagnosis present

## 2014-04-03 DIAGNOSIS — I10 Essential (primary) hypertension: Secondary | ICD-10-CM | POA: Diagnosis present

## 2014-04-03 DIAGNOSIS — E46 Unspecified protein-calorie malnutrition: Secondary | ICD-10-CM | POA: Diagnosis not present

## 2014-04-03 DIAGNOSIS — D696 Thrombocytopenia, unspecified: Secondary | ICD-10-CM | POA: Diagnosis present

## 2014-04-03 DIAGNOSIS — J80 Acute respiratory distress syndrome: Secondary | ICD-10-CM | POA: Diagnosis not present

## 2014-04-03 DIAGNOSIS — M542 Cervicalgia: Secondary | ICD-10-CM | POA: Diagnosis not present

## 2014-04-03 DIAGNOSIS — J209 Acute bronchitis, unspecified: Secondary | ICD-10-CM | POA: Diagnosis not present

## 2014-04-03 DIAGNOSIS — R609 Edema, unspecified: Secondary | ICD-10-CM | POA: Diagnosis present

## 2014-04-03 DIAGNOSIS — K219 Gastro-esophageal reflux disease without esophagitis: Secondary | ICD-10-CM | POA: Diagnosis present

## 2014-04-03 DIAGNOSIS — J9621 Acute and chronic respiratory failure with hypoxia: Secondary | ICD-10-CM | POA: Diagnosis present

## 2014-04-03 DIAGNOSIS — I509 Heart failure, unspecified: Secondary | ICD-10-CM | POA: Diagnosis not present

## 2014-04-03 DIAGNOSIS — Z8673 Personal history of transient ischemic attack (TIA), and cerebral infarction without residual deficits: Secondary | ICD-10-CM | POA: Diagnosis not present

## 2014-04-03 DIAGNOSIS — W19XXXA Unspecified fall, initial encounter: Secondary | ICD-10-CM | POA: Diagnosis not present

## 2014-04-03 DIAGNOSIS — I2510 Atherosclerotic heart disease of native coronary artery without angina pectoris: Secondary | ICD-10-CM | POA: Diagnosis present

## 2014-04-03 DIAGNOSIS — Z7902 Long term (current) use of antithrombotics/antiplatelets: Secondary | ICD-10-CM | POA: Diagnosis not present

## 2014-04-03 DIAGNOSIS — J9602 Acute respiratory failure with hypercapnia: Secondary | ICD-10-CM | POA: Diagnosis not present

## 2014-04-03 DIAGNOSIS — N400 Enlarged prostate without lower urinary tract symptoms: Secondary | ICD-10-CM | POA: Diagnosis present

## 2014-04-03 DIAGNOSIS — T148 Other injury of unspecified body region: Secondary | ICD-10-CM | POA: Diagnosis not present

## 2014-04-03 DIAGNOSIS — E872 Acidosis: Secondary | ICD-10-CM | POA: Diagnosis present

## 2014-04-03 DIAGNOSIS — I209 Angina pectoris, unspecified: Secondary | ICD-10-CM | POA: Diagnosis not present

## 2014-04-03 DIAGNOSIS — I82A29 Chronic embolism and thrombosis of unspecified axillary vein: Secondary | ICD-10-CM | POA: Diagnosis not present

## 2014-04-03 DIAGNOSIS — J441 Chronic obstructive pulmonary disease with (acute) exacerbation: Secondary | ICD-10-CM | POA: Diagnosis not present

## 2014-04-04 DIAGNOSIS — E785 Hyperlipidemia, unspecified: Secondary | ICD-10-CM | POA: Diagnosis present

## 2014-04-04 DIAGNOSIS — F17210 Nicotine dependence, cigarettes, uncomplicated: Secondary | ICD-10-CM | POA: Diagnosis present

## 2014-04-04 DIAGNOSIS — Z86718 Personal history of other venous thrombosis and embolism: Secondary | ICD-10-CM | POA: Diagnosis not present

## 2014-04-04 DIAGNOSIS — E872 Acidosis: Secondary | ICD-10-CM | POA: Diagnosis present

## 2014-04-04 DIAGNOSIS — I509 Heart failure, unspecified: Secondary | ICD-10-CM | POA: Diagnosis not present

## 2014-04-04 DIAGNOSIS — E870 Hyperosmolality and hypernatremia: Secondary | ICD-10-CM | POA: Diagnosis present

## 2014-04-04 DIAGNOSIS — R2689 Other abnormalities of gait and mobility: Secondary | ICD-10-CM | POA: Diagnosis present

## 2014-04-04 DIAGNOSIS — G92 Toxic encephalopathy: Secondary | ICD-10-CM | POA: Diagnosis present

## 2014-04-04 DIAGNOSIS — F419 Anxiety disorder, unspecified: Secondary | ICD-10-CM | POA: Diagnosis present

## 2014-04-04 DIAGNOSIS — I82C21 Chronic embolism and thrombosis of right internal jugular vein: Secondary | ICD-10-CM | POA: Diagnosis not present

## 2014-04-04 DIAGNOSIS — R5381 Other malaise: Secondary | ICD-10-CM | POA: Diagnosis not present

## 2014-04-04 DIAGNOSIS — M6281 Muscle weakness (generalized): Secondary | ICD-10-CM | POA: Diagnosis present

## 2014-04-04 DIAGNOSIS — J9610 Chronic respiratory failure, unspecified whether with hypoxia or hypercapnia: Secondary | ICD-10-CM | POA: Diagnosis not present

## 2014-04-04 DIAGNOSIS — W19XXXA Unspecified fall, initial encounter: Secondary | ICD-10-CM | POA: Diagnosis not present

## 2014-04-04 DIAGNOSIS — D649 Anemia, unspecified: Secondary | ICD-10-CM | POA: Diagnosis present

## 2014-04-04 DIAGNOSIS — I82721 Chronic embolism and thrombosis of deep veins of right upper extremity: Secondary | ICD-10-CM | POA: Diagnosis not present

## 2014-04-04 DIAGNOSIS — J441 Chronic obstructive pulmonary disease with (acute) exacerbation: Secondary | ICD-10-CM | POA: Diagnosis not present

## 2014-04-04 DIAGNOSIS — R609 Edema, unspecified: Secondary | ICD-10-CM | POA: Diagnosis present

## 2014-04-04 DIAGNOSIS — G9341 Metabolic encephalopathy: Secondary | ICD-10-CM | POA: Diagnosis not present

## 2014-04-04 DIAGNOSIS — I82A21 Chronic embolism and thrombosis of right axillary vein: Secondary | ICD-10-CM | POA: Diagnosis present

## 2014-04-04 DIAGNOSIS — J449 Chronic obstructive pulmonary disease, unspecified: Secondary | ICD-10-CM | POA: Diagnosis not present

## 2014-04-04 DIAGNOSIS — J9622 Acute and chronic respiratory failure with hypercapnia: Secondary | ICD-10-CM | POA: Diagnosis present

## 2014-04-04 DIAGNOSIS — Z7902 Long term (current) use of antithrombotics/antiplatelets: Secondary | ICD-10-CM | POA: Diagnosis not present

## 2014-04-04 DIAGNOSIS — M542 Cervicalgia: Secondary | ICD-10-CM | POA: Diagnosis not present

## 2014-04-04 DIAGNOSIS — Z9981 Dependence on supplemental oxygen: Secondary | ICD-10-CM | POA: Diagnosis not present

## 2014-04-04 DIAGNOSIS — I2510 Atherosclerotic heart disease of native coronary artery without angina pectoris: Secondary | ICD-10-CM | POA: Diagnosis present

## 2014-04-04 DIAGNOSIS — J9612 Chronic respiratory failure with hypercapnia: Secondary | ICD-10-CM | POA: Diagnosis not present

## 2014-04-04 DIAGNOSIS — D696 Thrombocytopenia, unspecified: Secondary | ICD-10-CM | POA: Diagnosis present

## 2014-04-04 DIAGNOSIS — T148 Other injury of unspecified body region: Secondary | ICD-10-CM | POA: Diagnosis not present

## 2014-04-04 DIAGNOSIS — Z720 Tobacco use: Secondary | ICD-10-CM | POA: Diagnosis not present

## 2014-04-04 DIAGNOSIS — J9621 Acute and chronic respiratory failure with hypoxia: Secondary | ICD-10-CM | POA: Diagnosis present

## 2014-04-04 DIAGNOSIS — N400 Enlarged prostate without lower urinary tract symptoms: Secondary | ICD-10-CM | POA: Diagnosis present

## 2014-04-04 DIAGNOSIS — I10 Essential (primary) hypertension: Secondary | ICD-10-CM | POA: Diagnosis present

## 2014-04-04 DIAGNOSIS — K219 Gastro-esophageal reflux disease without esophagitis: Secondary | ICD-10-CM | POA: Diagnosis present

## 2014-04-04 DIAGNOSIS — E874 Mixed disorder of acid-base balance: Secondary | ICD-10-CM | POA: Diagnosis present

## 2014-04-04 DIAGNOSIS — R0602 Shortness of breath: Secondary | ICD-10-CM | POA: Diagnosis not present

## 2014-04-09 DIAGNOSIS — I491 Atrial premature depolarization: Secondary | ICD-10-CM | POA: Diagnosis not present

## 2014-04-09 DIAGNOSIS — I82629 Acute embolism and thrombosis of deep veins of unspecified upper extremity: Secondary | ICD-10-CM | POA: Diagnosis not present

## 2014-04-09 DIAGNOSIS — R1310 Dysphagia, unspecified: Secondary | ICD-10-CM | POA: Diagnosis present

## 2014-04-09 DIAGNOSIS — Z9981 Dependence on supplemental oxygen: Secondary | ICD-10-CM | POA: Diagnosis not present

## 2014-04-09 DIAGNOSIS — I10 Essential (primary) hypertension: Secondary | ICD-10-CM | POA: Diagnosis not present

## 2014-04-09 DIAGNOSIS — J9621 Acute and chronic respiratory failure with hypoxia: Secondary | ICD-10-CM | POA: Diagnosis not present

## 2014-04-09 DIAGNOSIS — F05 Delirium due to known physiological condition: Secondary | ICD-10-CM | POA: Diagnosis not present

## 2014-04-09 DIAGNOSIS — K219 Gastro-esophageal reflux disease without esophagitis: Secondary | ICD-10-CM | POA: Diagnosis not present

## 2014-04-09 DIAGNOSIS — I272 Other secondary pulmonary hypertension: Secondary | ICD-10-CM | POA: Diagnosis not present

## 2014-04-09 DIAGNOSIS — J9692 Respiratory failure, unspecified with hypercapnia: Secondary | ICD-10-CM | POA: Diagnosis not present

## 2014-04-09 DIAGNOSIS — E878 Other disorders of electrolyte and fluid balance, not elsewhere classified: Secondary | ICD-10-CM | POA: Diagnosis not present

## 2014-04-09 DIAGNOSIS — R0602 Shortness of breath: Secondary | ICD-10-CM | POA: Diagnosis not present

## 2014-04-09 DIAGNOSIS — Z86718 Personal history of other venous thrombosis and embolism: Secondary | ICD-10-CM | POA: Diagnosis not present

## 2014-04-09 DIAGNOSIS — E8770 Fluid overload, unspecified: Secondary | ICD-10-CM | POA: Diagnosis not present

## 2014-04-09 DIAGNOSIS — G4733 Obstructive sleep apnea (adult) (pediatric): Secondary | ICD-10-CM | POA: Diagnosis present

## 2014-04-09 DIAGNOSIS — J9622 Acute and chronic respiratory failure with hypercapnia: Secondary | ICD-10-CM | POA: Diagnosis not present

## 2014-04-09 DIAGNOSIS — Z7901 Long term (current) use of anticoagulants: Secondary | ICD-10-CM | POA: Diagnosis not present

## 2014-04-09 DIAGNOSIS — Z8673 Personal history of transient ischemic attack (TIA), and cerebral infarction without residual deficits: Secondary | ICD-10-CM | POA: Diagnosis not present

## 2014-04-09 DIAGNOSIS — R4182 Altered mental status, unspecified: Secondary | ICD-10-CM | POA: Diagnosis not present

## 2014-04-09 DIAGNOSIS — E872 Acidosis: Secondary | ICD-10-CM | POA: Diagnosis not present

## 2014-04-09 DIAGNOSIS — R0689 Other abnormalities of breathing: Secondary | ICD-10-CM | POA: Diagnosis not present

## 2014-04-09 DIAGNOSIS — I5030 Unspecified diastolic (congestive) heart failure: Secondary | ICD-10-CM | POA: Diagnosis not present

## 2014-04-09 DIAGNOSIS — I5033 Acute on chronic diastolic (congestive) heart failure: Secondary | ICD-10-CM | POA: Diagnosis not present

## 2014-04-09 DIAGNOSIS — R410 Disorientation, unspecified: Secondary | ICD-10-CM | POA: Diagnosis not present

## 2014-04-09 DIAGNOSIS — R7989 Other specified abnormal findings of blood chemistry: Secondary | ICD-10-CM | POA: Diagnosis not present

## 2014-04-09 DIAGNOSIS — R072 Precordial pain: Secondary | ICD-10-CM | POA: Diagnosis not present

## 2014-04-09 DIAGNOSIS — J441 Chronic obstructive pulmonary disease with (acute) exacerbation: Secondary | ICD-10-CM | POA: Diagnosis not present

## 2014-04-09 DIAGNOSIS — J9691 Respiratory failure, unspecified with hypoxia: Secondary | ICD-10-CM | POA: Diagnosis not present

## 2014-04-09 DIAGNOSIS — R079 Chest pain, unspecified: Secondary | ICD-10-CM | POA: Diagnosis not present

## 2014-04-09 DIAGNOSIS — D649 Anemia, unspecified: Secondary | ICD-10-CM | POA: Diagnosis not present

## 2014-04-09 DIAGNOSIS — I639 Cerebral infarction, unspecified: Secondary | ICD-10-CM | POA: Diagnosis not present

## 2014-04-09 DIAGNOSIS — I82A21 Chronic embolism and thrombosis of right axillary vein: Secondary | ICD-10-CM | POA: Diagnosis not present

## 2014-04-09 DIAGNOSIS — Z8249 Family history of ischemic heart disease and other diseases of the circulatory system: Secondary | ICD-10-CM | POA: Diagnosis not present

## 2014-04-09 DIAGNOSIS — F419 Anxiety disorder, unspecified: Secondary | ICD-10-CM | POA: Diagnosis not present

## 2014-04-09 DIAGNOSIS — F4310 Post-traumatic stress disorder, unspecified: Secondary | ICD-10-CM | POA: Diagnosis present

## 2014-04-09 DIAGNOSIS — J449 Chronic obstructive pulmonary disease, unspecified: Secondary | ICD-10-CM | POA: Diagnosis not present

## 2014-04-09 DIAGNOSIS — M6281 Muscle weakness (generalized): Secondary | ICD-10-CM | POA: Diagnosis not present

## 2014-04-09 DIAGNOSIS — Z9114 Patient's other noncompliance with medication regimen: Secondary | ICD-10-CM | POA: Diagnosis present

## 2014-04-09 DIAGNOSIS — R0902 Hypoxemia: Secondary | ICD-10-CM | POA: Diagnosis not present

## 2014-04-09 DIAGNOSIS — E785 Hyperlipidemia, unspecified: Secondary | ICD-10-CM | POA: Diagnosis not present

## 2014-04-09 DIAGNOSIS — R069 Unspecified abnormalities of breathing: Secondary | ICD-10-CM | POA: Diagnosis not present

## 2014-04-09 DIAGNOSIS — R0789 Other chest pain: Secondary | ICD-10-CM | POA: Diagnosis not present

## 2014-04-09 DIAGNOSIS — F17210 Nicotine dependence, cigarettes, uncomplicated: Secondary | ICD-10-CM | POA: Diagnosis present

## 2014-04-09 DIAGNOSIS — I2510 Atherosclerotic heart disease of native coronary artery without angina pectoris: Secondary | ICD-10-CM | POA: Diagnosis not present

## 2014-04-09 DIAGNOSIS — M7989 Other specified soft tissue disorders: Secondary | ICD-10-CM | POA: Diagnosis not present

## 2014-04-09 DIAGNOSIS — I5023 Acute on chronic systolic (congestive) heart failure: Secondary | ICD-10-CM | POA: Diagnosis not present

## 2014-04-09 DIAGNOSIS — N400 Enlarged prostate without lower urinary tract symptoms: Secondary | ICD-10-CM | POA: Diagnosis not present

## 2014-04-09 DIAGNOSIS — I248 Other forms of acute ischemic heart disease: Secondary | ICD-10-CM | POA: Diagnosis not present

## 2014-04-22 DIAGNOSIS — F0390 Unspecified dementia without behavioral disturbance: Secondary | ICD-10-CM | POA: Diagnosis not present

## 2014-04-22 DIAGNOSIS — R634 Abnormal weight loss: Secondary | ICD-10-CM | POA: Diagnosis not present

## 2014-04-22 DIAGNOSIS — I10 Essential (primary) hypertension: Secondary | ICD-10-CM | POA: Diagnosis not present

## 2014-04-22 DIAGNOSIS — J441 Chronic obstructive pulmonary disease with (acute) exacerbation: Secondary | ICD-10-CM | POA: Diagnosis not present

## 2014-04-22 DIAGNOSIS — I5033 Acute on chronic diastolic (congestive) heart failure: Secondary | ICD-10-CM | POA: Diagnosis not present

## 2014-04-22 DIAGNOSIS — R0602 Shortness of breath: Secondary | ICD-10-CM | POA: Diagnosis not present

## 2014-04-22 DIAGNOSIS — Z8673 Personal history of transient ischemic attack (TIA), and cerebral infarction without residual deficits: Secondary | ICD-10-CM | POA: Diagnosis not present

## 2014-04-22 DIAGNOSIS — F39 Unspecified mood [affective] disorder: Secondary | ICD-10-CM | POA: Diagnosis not present

## 2014-04-22 DIAGNOSIS — J9622 Acute and chronic respiratory failure with hypercapnia: Secondary | ICD-10-CM | POA: Diagnosis not present

## 2014-04-22 DIAGNOSIS — I82A21 Chronic embolism and thrombosis of right axillary vein: Secondary | ICD-10-CM | POA: Diagnosis not present

## 2014-04-22 DIAGNOSIS — J984 Other disorders of lung: Secondary | ICD-10-CM | POA: Diagnosis not present

## 2014-04-22 DIAGNOSIS — F22 Delusional disorders: Secondary | ICD-10-CM | POA: Diagnosis not present

## 2014-04-22 DIAGNOSIS — J9691 Respiratory failure, unspecified with hypoxia: Secondary | ICD-10-CM | POA: Diagnosis not present

## 2014-04-22 DIAGNOSIS — J9621 Acute and chronic respiratory failure with hypoxia: Secondary | ICD-10-CM | POA: Diagnosis not present

## 2014-04-22 DIAGNOSIS — Z9981 Dependence on supplemental oxygen: Secondary | ICD-10-CM | POA: Diagnosis not present

## 2014-04-22 DIAGNOSIS — M6281 Muscle weakness (generalized): Secondary | ICD-10-CM | POA: Diagnosis not present

## 2014-04-22 DIAGNOSIS — N400 Enlarged prostate without lower urinary tract symptoms: Secondary | ICD-10-CM | POA: Diagnosis not present

## 2014-04-22 DIAGNOSIS — F05 Delirium due to known physiological condition: Secondary | ICD-10-CM | POA: Diagnosis not present

## 2014-04-22 DIAGNOSIS — I5030 Unspecified diastolic (congestive) heart failure: Secondary | ICD-10-CM | POA: Diagnosis not present

## 2014-04-22 DIAGNOSIS — F419 Anxiety disorder, unspecified: Secondary | ICD-10-CM | POA: Diagnosis not present

## 2014-04-22 DIAGNOSIS — J9692 Respiratory failure, unspecified with hypercapnia: Secondary | ICD-10-CM | POA: Diagnosis not present

## 2014-04-22 DIAGNOSIS — J432 Centrilobular emphysema: Secondary | ICD-10-CM | POA: Diagnosis not present

## 2014-04-22 DIAGNOSIS — F29 Unspecified psychosis not due to a substance or known physiological condition: Secondary | ICD-10-CM | POA: Diagnosis not present

## 2014-04-23 DIAGNOSIS — F05 Delirium due to known physiological condition: Secondary | ICD-10-CM | POA: Diagnosis not present

## 2014-04-23 DIAGNOSIS — R634 Abnormal weight loss: Secondary | ICD-10-CM | POA: Diagnosis not present

## 2014-04-23 DIAGNOSIS — J984 Other disorders of lung: Secondary | ICD-10-CM | POA: Diagnosis not present

## 2014-05-06 DIAGNOSIS — F22 Delusional disorders: Secondary | ICD-10-CM | POA: Diagnosis not present

## 2014-05-06 DIAGNOSIS — J432 Centrilobular emphysema: Secondary | ICD-10-CM | POA: Diagnosis not present

## 2014-05-20 DIAGNOSIS — J432 Centrilobular emphysema: Secondary | ICD-10-CM | POA: Diagnosis not present

## 2014-05-20 DIAGNOSIS — F22 Delusional disorders: Secondary | ICD-10-CM | POA: Diagnosis not present

## 2014-05-20 DIAGNOSIS — I10 Essential (primary) hypertension: Secondary | ICD-10-CM | POA: Diagnosis not present

## 2014-05-26 DIAGNOSIS — J449 Chronic obstructive pulmonary disease, unspecified: Secondary | ICD-10-CM | POA: Diagnosis not present

## 2014-05-26 DIAGNOSIS — F0390 Unspecified dementia without behavioral disturbance: Secondary | ICD-10-CM | POA: Diagnosis not present

## 2014-05-26 DIAGNOSIS — I10 Essential (primary) hypertension: Secondary | ICD-10-CM | POA: Diagnosis not present

## 2014-05-26 DIAGNOSIS — Z09 Encounter for follow-up examination after completed treatment for conditions other than malignant neoplasm: Secondary | ICD-10-CM | POA: Diagnosis not present

## 2014-05-30 ENCOUNTER — Emergency Department (HOSPITAL_COMMUNITY)
Admission: EM | Admit: 2014-05-30 | Discharge: 2014-05-30 | Disposition: A | Discharge status: Home / Self Care | Payer: Medicare Other | Source: Home / Self Care | Attending: Emergency Medicine | Admitting: Emergency Medicine

## 2014-05-30 ENCOUNTER — Emergency Department (HOSPITAL_COMMUNITY): Payer: Medicare Other

## 2014-05-30 ENCOUNTER — Encounter (HOSPITAL_COMMUNITY): Payer: Self-pay | Admitting: Emergency Medicine

## 2014-05-30 DIAGNOSIS — I129 Hypertensive chronic kidney disease with stage 1 through stage 4 chronic kidney disease, or unspecified chronic kidney disease: Secondary | ICD-10-CM | POA: Insufficient documentation

## 2014-05-30 DIAGNOSIS — Z8659 Personal history of other mental and behavioral disorders: Secondary | ICD-10-CM

## 2014-05-30 DIAGNOSIS — I739 Peripheral vascular disease, unspecified: Secondary | ICD-10-CM | POA: Diagnosis present

## 2014-05-30 DIAGNOSIS — R069 Unspecified abnormalities of breathing: Secondary | ICD-10-CM | POA: Diagnosis not present

## 2014-05-30 DIAGNOSIS — Z87891 Personal history of nicotine dependence: Secondary | ICD-10-CM | POA: Diagnosis not present

## 2014-05-30 DIAGNOSIS — K922 Gastrointestinal hemorrhage, unspecified: Secondary | ICD-10-CM | POA: Diagnosis not present

## 2014-05-30 DIAGNOSIS — R0602 Shortness of breath: Secondary | ICD-10-CM

## 2014-05-30 DIAGNOSIS — J159 Unspecified bacterial pneumonia: Secondary | ICD-10-CM

## 2014-05-30 DIAGNOSIS — F329 Major depressive disorder, single episode, unspecified: Secondary | ICD-10-CM | POA: Diagnosis present

## 2014-05-30 DIAGNOSIS — I2510 Atherosclerotic heart disease of native coronary artery without angina pectoris: Secondary | ICD-10-CM

## 2014-05-30 DIAGNOSIS — R195 Other fecal abnormalities: Secondary | ICD-10-CM | POA: Diagnosis present

## 2014-05-30 DIAGNOSIS — J441 Chronic obstructive pulmonary disease with (acute) exacerbation: Secondary | ICD-10-CM

## 2014-05-30 DIAGNOSIS — E785 Hyperlipidemia, unspecified: Secondary | ICD-10-CM | POA: Insufficient documentation

## 2014-05-30 DIAGNOSIS — Z7951 Long term (current) use of inhaled steroids: Secondary | ICD-10-CM | POA: Insufficient documentation

## 2014-05-30 DIAGNOSIS — N189 Chronic kidney disease, unspecified: Secondary | ICD-10-CM

## 2014-05-30 DIAGNOSIS — N179 Acute kidney failure, unspecified: Secondary | ICD-10-CM | POA: Diagnosis not present

## 2014-05-30 DIAGNOSIS — E860 Dehydration: Secondary | ICD-10-CM | POA: Diagnosis present

## 2014-05-30 DIAGNOSIS — D649 Anemia, unspecified: Secondary | ICD-10-CM | POA: Diagnosis present

## 2014-05-30 DIAGNOSIS — F4310 Post-traumatic stress disorder, unspecified: Secondary | ICD-10-CM | POA: Diagnosis present

## 2014-05-30 DIAGNOSIS — R1010 Upper abdominal pain, unspecified: Secondary | ICD-10-CM | POA: Diagnosis not present

## 2014-05-30 DIAGNOSIS — R079 Chest pain, unspecified: Secondary | ICD-10-CM | POA: Diagnosis not present

## 2014-05-30 DIAGNOSIS — N400 Enlarged prostate without lower urinary tract symptoms: Secondary | ICD-10-CM | POA: Diagnosis present

## 2014-05-30 DIAGNOSIS — J449 Chronic obstructive pulmonary disease, unspecified: Secondary | ICD-10-CM | POA: Diagnosis not present

## 2014-05-30 DIAGNOSIS — J189 Pneumonia, unspecified organism: Secondary | ICD-10-CM

## 2014-05-30 DIAGNOSIS — J181 Lobar pneumonia, unspecified organism: Secondary | ICD-10-CM | POA: Diagnosis not present

## 2014-05-30 DIAGNOSIS — Z79899 Other long term (current) drug therapy: Secondary | ICD-10-CM | POA: Insufficient documentation

## 2014-05-30 LAB — CBC
HCT: 27.9 % — ABNORMAL LOW (ref 39.0–52.0)
HEMOGLOBIN: 8.7 g/dL — AB (ref 13.0–17.0)
MCH: 28.2 pg (ref 26.0–34.0)
MCHC: 31.2 g/dL (ref 30.0–36.0)
MCV: 90.3 fL (ref 78.0–100.0)
PLATELETS: 184 10*3/uL (ref 150–400)
RBC: 3.09 MIL/uL — ABNORMAL LOW (ref 4.22–5.81)
RDW: 13.8 % (ref 11.5–15.5)
WBC: 7.5 10*3/uL (ref 4.0–10.5)

## 2014-05-30 LAB — BASIC METABOLIC PANEL
Anion gap: 8 (ref 5–15)
BUN: 16 mg/dL (ref 6–20)
CHLORIDE: 100 mmol/L — AB (ref 101–111)
CO2: 36 mmol/L — ABNORMAL HIGH (ref 22–32)
Calcium: 8.9 mg/dL (ref 8.9–10.3)
Creatinine, Ser: 1.08 mg/dL (ref 0.61–1.24)
Glucose, Bld: 101 mg/dL — ABNORMAL HIGH (ref 65–99)
POTASSIUM: 4.5 mmol/L (ref 3.5–5.1)
Sodium: 144 mmol/L (ref 135–145)

## 2014-05-30 LAB — I-STAT TROPONIN, ED: TROPONIN I, POC: 0.01 ng/mL (ref 0.00–0.08)

## 2014-05-30 LAB — BRAIN NATRIURETIC PEPTIDE: B NATRIURETIC PEPTIDE 5: 258.2 pg/mL — AB (ref 0.0–100.0)

## 2014-05-30 MED ORDER — LEVOFLOXACIN 750 MG PO TABS: 750.0000 mg | ORAL_TABLET | Freq: Every day | ORAL | Status: DC

## 2014-05-30 MED ORDER — IPRATROPIUM BROMIDE 0.02 % IN SOLN
0.5000 mg | Freq: Once | RESPIRATORY_TRACT | Status: AC
  Administered 2014-05-30: 0.5 mg via RESPIRATORY_TRACT
  Filled 2014-05-30: qty 2.5

## 2014-05-30 MED ORDER — PREDNISONE 20 MG PO TABS
60.0000 mg | ORAL_TABLET | Freq: Once | ORAL | Status: AC
  Administered 2014-05-30: 60 mg via ORAL
  Filled 2014-05-30: qty 3

## 2014-05-30 MED ORDER — LEVOFLOXACIN 750 MG PO TABS
750.0000 mg | ORAL_TABLET | Freq: Once | ORAL | Status: AC
  Administered 2014-05-30: 750 mg via ORAL
  Filled 2014-05-30: qty 1

## 2014-05-30 MED ORDER — ALBUTEROL (5 MG/ML) CONTINUOUS INHALATION SOLN
10.0000 mg/h | INHALATION_SOLUTION | Freq: Once | RESPIRATORY_TRACT | Status: AC
  Administered 2014-05-30: 10 mg/h via RESPIRATORY_TRACT
  Filled 2014-05-30: qty 20

## 2014-05-30 MED ORDER — ALBUTEROL SULFATE (2.5 MG/3ML) 0.083% IN NEBU
2.5000 mg | INHALATION_SOLUTION | RESPIRATORY_TRACT | Status: DC | PRN
Start: 2014-05-30 — End: 2014-06-04

## 2014-05-30 MED ORDER — PREDNISONE 20 MG PO TABS: 60.0000 mg | ORAL_TABLET | Freq: Every day | ORAL | Status: DC

## 2014-05-30 NOTE — Discharge Instructions (Signed)

## 2014-05-30 NOTE — ED Notes (Signed)
Brought in by EMS from home with c/o shortness of breath.  Pt reported that he has been short of breath for the past few days--- symptoms got to its worst tonight.  Hx of COPD.  Pt was given Solu-Medrol 125 mg IV and Albuterol 10 mg and Atrovent 0.5 mg neb treatment.  Pt arrived to ED with ongoing neb tx--- states shortness of breath getting better.

## 2014-05-30 NOTE — ED Provider Notes (Signed)
CSN: [id number]     Arrival date & time 05/30/14  1950 History   First MD Initiated Contact with Patient 05/30/14 1951     Chief Complaint  Patient presents with  . Shortness of Breath     (Consider location/radiation/quality/duration/timing/severity/associated sxs/prior Treatment) Patient is a 81 y.o. male presenting with shortness of breath. The history is provided by the patient.  Shortness of Breath Severity:  Moderate Onset quality:  Gradual Duration:  3 days Timing:  Constant Progression:  Worsening Chronicity:  Chronic Context: not URI   Relieved by:  Inhaler Worsened by:  Nothing tried Associated symptoms: cough   Associated symptoms: no chest pain and no fever     Past Medical History  Diagnosis Date  . CKD (chronic kidney disease)   . CAD (coronary artery disease)   . HTN (hypertension)   . PTSD (post-traumatic stress disorder)   . Tobacco abuse   . Hyperlipidemia   . COPD (chronic obstructive pulmonary disease)   . Peripheral arterial disease    History reviewed. No pertinent past surgical history. Family History  Problem Relation Age of Onset  . Heart attack Father   . Hyperlipidemia Father   . Hypertension Father   . Stroke Father    History  Substance Use Topics  . Smoking status: Former Smoker    Types: Cigarettes    Quit date: 12/01/2012  . Smokeless tobacco: Not on file  . Alcohol Use: No    Review of Systems  Constitutional: Negative for fever and chills.  Respiratory: Positive for cough and shortness of breath.   Cardiovascular: Negative for chest pain and leg swelling.  All other systems reviewed and are negative.     Allergies  Review of patient's allergies indicates not on file.  Home Medications   Prior to Admission medications   Medication Sig Start Date End Date Taking? Authorizing Provider  ALBUTEROL IN Inhale into the lungs as needed.    Historical Provider, MD  budesonide-formoterol (SYMBICORT) 160-4.5 MCG/ACT inhaler  Inhale 2 puffs into the lungs 2 (two) times daily.    Historical Provider, MD  clonazePAM (KLONOPIN) 1 MG tablet Take 1 mg by mouth at bedtime.    Historical Provider, MD  hydrochlorothiazide (MICROZIDE) 12.5 MG capsule Take 12.5 mg by mouth daily.    Historical Provider, MD  isosorbide mononitrate (IMDUR) 30 MG 24 hr tablet Take 30 mg by mouth daily.    Historical Provider, MD  nitroGLYCERIN (NITROSTAT) 0.4 MG SL tablet Place 0.4 mg under the tongue every 5 (five) minutes as needed for chest pain.    Historical Provider, MD  simvastatin (ZOCOR) 20 MG tablet Take 20 mg by mouth daily.    Historical Provider, MD  tamsulosin (FLOMAX) 0.4 MG CAPS capsule Take 0.4 mg by mouth daily after supper.    Historical Provider, MD  Vitamin D, Ergocalciferol, (DRISDOL) 50000 UNITS CAPS capsule Take 50,000 Units by mouth every 7 (seven) days.    Historical Provider, MD  zolpidem (AMBIEN CR) 12.5 MG CR tablet Take 12.5 mg by mouth at bedtime as needed for sleep.    Historical Provider, MD   BP 147/60 mmHg  Pulse 67  Temp(Src) 98.8 F (37.1 C) (Oral)  Resp 20  SpO2 100% Physical Exam  Constitutional: He is oriented to person, place, and time. He appears well-developed and well-nourished. No distress.  HENT:  Head: Normocephalic and atraumatic.  Mouth/Throat: No oropharyngeal exudate.  Eyes: EOM are normal. Pupils are equal, round, and reactive to light.  Neck: Normal range of motion. Neck supple.  Cardiovascular: Normal rate and regular rhythm.  Exam reveals no friction rub.   No murmur heard. Pulmonary/Chest: Effort normal. No respiratory distress. He has decreased breath sounds (diffusely). He has wheezes (mild, diffuse). He has no rhonchi. He has no rales.  Abdominal: He exhibits no distension. There is no tenderness. There is no rebound.  Musculoskeletal: Normal range of motion. He exhibits no edema.  Neurological: He is alert and oriented to person, place, and time.  Skin: He is not diaphoretic.   Nursing note and vitals reviewed.   ED Course  Procedures (including critical care time) Labs Review Labs Reviewed  CBC  BASIC METABOLIC PANEL  BRAIN NATRIURETIC PEPTIDE  I-STAT [place], ED    Imaging Review Dg Chest 2 View  05/30/2014   CLINICAL DATA:  Acute onset of shortness of breath. Initial encounter.  EXAM: CHEST  2 VIEW  COMPARISON:  Chest radiograph performed 04/04/2014  FINDINGS: The lungs are well-aerated. Small bilateral pleural effusions are seen. Minimal right midlung opacity may reflect atelectasis or possibly mild pneumonia. No pneumothorax is seen.  The heart is normal in size; the mediastinal contour is within normal limits. No acute osseous abnormalities are seen.  IMPRESSION: Small bilateral pleural effusions noted; minimal right mid lung opacity may reflect atelectasis or possibly mild pneumonia.   Electronically Signed   By: Garald Balding M.D.   On: 05/30/2014 20:43     EKG Interpretation   Date/Time:  Saturday May 30 2014 19:56:09 EDT Ventricular Rate:  61 PR Interval:  124 QRS Duration: 83 QT Interval:  420 QTC Calculation: 423 R Axis:   91 Text Interpretation:  Sinus rhythm Atrial premature complex Right axis  deviation Borderline T wave abnormalities Borderline ST elevation,  anterior leads Baseline wander in lead(s) V2 No prior for comparison  Confirmed by Mingo Amber  MD, Derinda Bartus ([id number]) on 05/30/2014 8:23:38 PM      MDM   Final diagnoses:  Shortness of breath  COPD exacerbation  Healthcare-associated pneumonia    81 year old male here with shortness of breath. History of COPD. Postmenopausal hospitalizations in the past couple months for this. Denies any fever. He does report worsening shortness of breath with exertion. He does deny chest pain. Here he is on a nebulizer and states he is doing better. After talking also noted was referred to several minutes, his symptoms became more force to read. He is extremely decreased lung sounds diffusely with  wheezing. Will check chest x-ray to look for possible pneumonia. We'll give breathing treatments. He received Solu-Medrol en route.  On re-exam, he's much improved. Relaxing comfortably, not having difficulty talking. Patient states he feels great and is reciting Shakespeare in the room. Patient's CXR shows possible pneumonia, no Regino count or fever. Will put on levaquin. I feel he is stable for discharge. He's on 2 L chronically, and has his POA, his neighbor, at the bedside and he states he's going to take care of him tonight and tomorrow. Given return precautions, instructed to f/u with PCP.   Evelina Bucy, MD 05/30/14 [phone]

## 2014-05-30 NOTE — ED Notes (Signed)
Bed: WA04 Expected date:  Expected time:  Means of arrival:  Comments: ems 

## 2014-06-01 ENCOUNTER — Inpatient Hospital Stay (HOSPITAL_COMMUNITY)
Admission: EM | Admit: 2014-06-01 | Discharge: 2014-06-04 | DRG: 191 | Disposition: A | Discharge status: Home / Self Care | Payer: Medicare Other | Attending: Internal Medicine | Admitting: Internal Medicine

## 2014-06-01 ENCOUNTER — Encounter (HOSPITAL_COMMUNITY): Payer: Self-pay | Admitting: Emergency Medicine

## 2014-06-01 ENCOUNTER — Emergency Department (HOSPITAL_COMMUNITY): Payer: Medicare Other

## 2014-06-01 DIAGNOSIS — J449 Chronic obstructive pulmonary disease, unspecified: Secondary | ICD-10-CM | POA: Diagnosis not present

## 2014-06-01 DIAGNOSIS — I2510 Atherosclerotic heart disease of native coronary artery without angina pectoris: Secondary | ICD-10-CM | POA: Diagnosis present

## 2014-06-01 DIAGNOSIS — F329 Major depressive disorder, single episode, unspecified: Secondary | ICD-10-CM | POA: Diagnosis present

## 2014-06-01 DIAGNOSIS — I129 Hypertensive chronic kidney disease with stage 1 through stage 4 chronic kidney disease, or unspecified chronic kidney disease: Secondary | ICD-10-CM | POA: Diagnosis present

## 2014-06-01 DIAGNOSIS — R195 Other fecal abnormalities: Secondary | ICD-10-CM | POA: Diagnosis present

## 2014-06-01 DIAGNOSIS — R109 Unspecified abdominal pain: Secondary | ICD-10-CM | POA: Diagnosis present

## 2014-06-01 DIAGNOSIS — F4310 Post-traumatic stress disorder, unspecified: Secondary | ICD-10-CM | POA: Insufficient documentation

## 2014-06-01 DIAGNOSIS — R0602 Shortness of breath: Secondary | ICD-10-CM | POA: Diagnosis not present

## 2014-06-01 DIAGNOSIS — J441 Chronic obstructive pulmonary disease with (acute) exacerbation: Secondary | ICD-10-CM | POA: Diagnosis not present

## 2014-06-01 DIAGNOSIS — R079 Chest pain, unspecified: Secondary | ICD-10-CM

## 2014-06-01 DIAGNOSIS — N400 Enlarged prostate without lower urinary tract symptoms: Secondary | ICD-10-CM | POA: Diagnosis not present

## 2014-06-01 DIAGNOSIS — I739 Peripheral vascular disease, unspecified: Secondary | ICD-10-CM | POA: Diagnosis present

## 2014-06-01 DIAGNOSIS — R071 Chest pain on breathing: Secondary | ICD-10-CM | POA: Diagnosis not present

## 2014-06-01 DIAGNOSIS — E785 Hyperlipidemia, unspecified: Secondary | ICD-10-CM | POA: Diagnosis present

## 2014-06-01 DIAGNOSIS — K922 Gastrointestinal hemorrhage, unspecified: Secondary | ICD-10-CM

## 2014-06-01 DIAGNOSIS — I10 Essential (primary) hypertension: Secondary | ICD-10-CM | POA: Diagnosis present

## 2014-06-01 DIAGNOSIS — N179 Acute kidney failure, unspecified: Secondary | ICD-10-CM

## 2014-06-01 DIAGNOSIS — R1010 Upper abdominal pain, unspecified: Secondary | ICD-10-CM | POA: Diagnosis not present

## 2014-06-01 DIAGNOSIS — N189 Chronic kidney disease, unspecified: Secondary | ICD-10-CM

## 2014-06-01 DIAGNOSIS — D649 Anemia, unspecified: Secondary | ICD-10-CM | POA: Diagnosis present

## 2014-06-01 DIAGNOSIS — N3289 Other specified disorders of bladder: Secondary | ICD-10-CM | POA: Diagnosis not present

## 2014-06-01 DIAGNOSIS — Z87891 Personal history of nicotine dependence: Secondary | ICD-10-CM | POA: Diagnosis not present

## 2014-06-01 DIAGNOSIS — N289 Disorder of kidney and ureter, unspecified: Secondary | ICD-10-CM | POA: Diagnosis not present

## 2014-06-01 DIAGNOSIS — J181 Lobar pneumonia, unspecified organism: Secondary | ICD-10-CM | POA: Diagnosis not present

## 2014-06-01 DIAGNOSIS — E860 Dehydration: Secondary | ICD-10-CM | POA: Diagnosis present

## 2014-06-01 HISTORY — DX: Benign prostatic hyperplasia without lower urinary tract symptoms: N40.0

## 2014-06-01 LAB — URINALYSIS, ROUTINE W REFLEX MICROSCOPIC
BILIRUBIN URINE: NEGATIVE
Glucose, UA: NEGATIVE mg/dL
Hgb urine dipstick: NEGATIVE
KETONES UR: NEGATIVE mg/dL
Leukocytes, UA: NEGATIVE
Nitrite: NEGATIVE
PH: 5 (ref 5.0–8.0)
PROTEIN: NEGATIVE mg/dL
Specific Gravity, Urine: 1.014 (ref 1.005–1.030)
UROBILINOGEN UA: 0.2 mg/dL (ref 0.0–1.0)

## 2014-06-01 LAB — COMPREHENSIVE METABOLIC PANEL
ALK PHOS: 77 U/L (ref 38–126)
ALT: 54 U/L (ref 17–63)
ANION GAP: 5 (ref 5–15)
AST: 61 U/L — AB (ref 15–41)
Albumin: 3.6 g/dL (ref 3.5–5.0)
BUN: 29 mg/dL — ABNORMAL HIGH (ref 6–20)
CHLORIDE: 103 mmol/L (ref 101–111)
CO2: 36 mmol/L — ABNORMAL HIGH (ref 22–32)
CREATININE: 1.5 mg/dL — AB (ref 0.61–1.24)
Calcium: 9.2 mg/dL (ref 8.9–10.3)
GFR calc Af Amer: 48 mL/min — ABNORMAL LOW (ref >60)
GFR calc non Af Amer: 42 mL/min — ABNORMAL LOW (ref >60)
Glucose, Bld: 132 mg/dL — ABNORMAL HIGH (ref 65–99)
POTASSIUM: 5 mmol/L (ref 3.5–5.1)
Sodium: 144 mmol/L (ref 135–145)
Total Bilirubin: 0.5 mg/dL (ref 0.3–1.2)
Total Protein: 6.6 g/dL (ref 6.5–8.1)

## 2014-06-01 LAB — CBC WITH DIFFERENTIAL/PLATELET
Basophils Absolute: 0 10*3/uL (ref 0.0–0.1)
Basophils Relative: 0 % (ref 0–1)
Eosinophils Absolute: 0 10*3/uL (ref 0.0–0.7)
Eosinophils Relative: 0 % (ref 0–5)
HEMATOCRIT: 27.7 % — AB (ref 39.0–52.0)
Hemoglobin: 8.3 g/dL — ABNORMAL LOW (ref 13.0–17.0)
LYMPHS ABS: 0.6 10*3/uL — AB (ref 0.7–4.0)
Lymphocytes Relative: 4 % — ABNORMAL LOW (ref 12–46)
MCH: 27.1 pg (ref 26.0–34.0)
MCHC: 30 g/dL (ref 30.0–36.0)
MCV: 90.5 fL (ref 78.0–100.0)
MONO ABS: 0.9 10*3/uL (ref 0.1–1.0)
Monocytes Relative: 6 % (ref 3–12)
Neutro Abs: 13.6 10*3/uL — ABNORMAL HIGH (ref 1.7–7.7)
Neutrophils Relative %: 90 % — ABNORMAL HIGH (ref 43–77)
PLATELETS: 184 10*3/uL (ref 150–400)
RBC: 3.06 MIL/uL — AB (ref 4.22–5.81)
RDW: 14.5 % (ref 11.5–15.5)
WBC: 15.1 10*3/uL — ABNORMAL HIGH (ref 4.0–10.5)

## 2014-06-01 LAB — LIPASE, BLOOD: Lipase: 20 U/L — ABNORMAL LOW (ref 22–51)

## 2014-06-01 LAB — BRAIN NATRIURETIC PEPTIDE: B NATRIURETIC PEPTIDE 5: 835 pg/mL — AB (ref 0.0–100.0)

## 2014-06-01 LAB — TROPONIN I

## 2014-06-01 LAB — POC OCCULT BLOOD, ED: Fecal Occult Bld: POSITIVE — AB

## 2014-06-01 LAB — I-STAT CG4 LACTIC ACID, ED: Lactic Acid, Venous: 1.91 mmol/L (ref 0.5–2.0)

## 2014-06-01 NOTE — ED Notes (Signed)
Pt desat to 88% on room air, states he is on 2L [place] at home. Pt placed on 2L [place], O2-100%.

## 2014-06-01 NOTE — ED Notes (Signed)
Pt placed on 2 L [place] by this RN

## 2014-06-01 NOTE — ED Notes (Signed)
CALL DOC [phone] IF ANY QUESTIONS

## 2014-06-01 NOTE — ED Provider Notes (Signed)
CSN: [id number]     Arrival date & time 06/01/14  1833 History   First MD Initiated Contact with Patient 06/01/14 1834     Chief Complaint  Patient presents with  . Shortness of Breath     (Consider location/radiation/quality/duration/timing/severity/associated sxs/prior Treatment) HPI Comments: Patient presents to the ER for evaluation of shortness of breath. Patient reports that he has been experiencing intermittent sharp pains in the center of his chest and pain in the upper abdomen. Patient is brought to the emergency department by EMS. Patient has been administered aspirin, nitroglycerin, albuterol, Solu-Medrol. Patient reports improvement. His pain has completely resolved, breathing is improved.   Past Medical History  Diagnosis Date  . CKD (chronic kidney disease)   . CAD (coronary artery disease)   . HTN (hypertension)   . PTSD (post-traumatic stress disorder)   . Tobacco abuse   . Hyperlipidemia   . COPD (chronic obstructive pulmonary disease)   . Peripheral arterial disease    History reviewed. No pertinent past surgical history. Family History  Problem Relation Age of Onset  . Heart attack Father   . Hyperlipidemia Father   . Hypertension Father   . Stroke Father    History  Substance Use Topics  . Smoking status: Former Smoker    Types: Cigarettes    Quit date: 12/01/2012  . Smokeless tobacco: Not on file  . Alcohol Use: No    Review of Systems  Respiratory: Positive for shortness of breath.   Cardiovascular: Positive for chest pain.  Gastrointestinal: Positive for abdominal pain.  All other systems reviewed and are negative.     Allergies  Review of patient's allergies indicates no known allergies.  Home Medications   Prior to Admission medications   Medication Sig Start Date End Date Taking? Authorizing Provider  albuterol (PROVENTIL) (2.5 MG/3ML) 0.083% nebulizer solution Take 3 mLs (2.5 mg total) by nebulization every 4 (four) hours as needed  for wheezing or shortness of breath. 05/30/14  Yes Evelina Bucy, MD  amLODipine (NORVASC) 10 MG tablet Take 10 mg by mouth daily. 05/20/14  Yes Historical Provider, MD  atorvastatin (LIPITOR) 40 MG tablet Take 40 mg by mouth daily. 05/20/14  Yes Historical Provider, MD  budesonide-formoterol (SYMBICORT) 160-4.5 MCG/ACT inhaler Inhale 2 puffs into the lungs 2 (two) times daily.   Yes Historical Provider, MD  clopidogrel (PLAVIX) 75 MG tablet Take 75 mg by mouth daily. 05/20/14  Yes Historical Provider, MD  donepezil (ARICEPT) 5 MG tablet Take 5 mg by mouth at bedtime. 05/20/14  Yes Historical Provider, MD  furosemide (LASIX) 20 MG tablet Take 20 mg by mouth every Tuesday, Thursday, Saturday, and Sunday at 6 PM. 05/20/14  Yes Historical Provider, MD  haloperidol (HALDOL) 5 MG tablet Take 5-10 mg by mouth every 6 (six) hours as needed for agitation.  05/27/14  Yes Historical Provider, MD  levofloxacin (LEVAQUIN) 750 MG tablet Take 1 tablet (750 mg total) by mouth daily. 05/30/14  Yes Evelina Bucy, MD  metoprolol tartrate (LOPRESSOR) 25 MG tablet Take 37.5 mg by mouth 2 (two) times daily. 05/20/14  Yes Historical Provider, MD  nitroGLYCERIN (NITROSTAT) 0.4 MG SL tablet Place 0.4 mg under the tongue every 5 (five) minutes as needed for chest pain.   Yes Historical Provider, MD  predniSONE (DELTASONE) 20 MG tablet Take 3 tablets (60 mg total) by mouth daily. 05/31/14  Yes Evelina Bucy, MD  PROAIR HFA 108 (90 BASE) MCG/ACT inhaler Inhale 2 puffs into the lungs every 6 (six)  hours as needed. 05/20/14  Yes Historical Provider, MD  QUEtiapine (SEROQUEL) 50 MG tablet Take 50 mg by mouth every morning. 05/20/14  Yes Historical Provider, MD  QUEtiapine Fumarate (SEROQUEL XR) 150 MG 24 hr tablet Take 150 mg by mouth at bedtime.   Yes Historical Provider, MD  SEROQUEL XR 200 MG 24 hr tablet Take 200 mg by mouth daily.  05/26/14  Yes Historical Provider, MD  tamsulosin (FLOMAX) 0.4 MG CAPS capsule Take 0.4 mg by mouth daily  after supper.   Yes Historical Provider, MD   BP 119/95 mmHg  Pulse 67  Temp(Src) 98.3 F (36.8 C) (Oral)  Resp 16  Ht 6' (1.829 m)  Wt 137 lb (62.143 kg)  BMI 18.58 kg/m2  SpO2 99% Physical Exam  Constitutional: He is oriented to person, place, and time. He appears well-developed and well-nourished. No distress.  HENT:  Head: Normocephalic and atraumatic.  Right Ear: Hearing normal.  Left Ear: Hearing normal.  Nose: Nose normal.  Mouth/Throat: Oropharynx is clear and moist and mucous membranes are normal.  Eyes: Conjunctivae and EOM are normal. Pupils are equal, round, and reactive to light.  Neck: Normal range of motion. Neck supple.  Cardiovascular: Regular rhythm, S1 normal and S2 normal.  Exam reveals no gallop and no friction rub.   No murmur heard. Pulmonary/Chest: Effort normal. Tachypnea noted. No respiratory distress. He has decreased breath sounds. He exhibits no tenderness.  Abdominal: Soft. Normal appearance and bowel sounds are normal. There is no hepatosplenomegaly. There is no tenderness. There is no rebound, no guarding, no tenderness at McBurney's point and negative Murphy's sign. No hernia.  Musculoskeletal: Normal range of motion.  Neurological: He is alert and oriented to person, place, and time. He has normal strength. No cranial nerve deficit or sensory deficit. Coordination normal. GCS eye subscore is 4. GCS verbal subscore is 5. GCS motor subscore is 6.  Skin: Skin is warm, dry and intact. No rash noted. No cyanosis.  Psychiatric: He has a normal mood and affect. His speech is normal and behavior is normal. Thought content normal.  Nursing note and vitals reviewed.   ED Course  Procedures (including critical care time) Labs Review Labs Reviewed  CBC WITH DIFFERENTIAL/PLATELET - Abnormal; Notable for the following:    WBC 15.1 (*)    RBC 3.06 (*)    Hemoglobin 8.3 (*)    HCT 27.7 (*)    Neutrophils Relative % 90 (*)    Neutro Abs 13.6 (*)     Lymphocytes Relative 4 (*)    Lymphs Abs 0.6 (*)    All other components within normal limits  COMPREHENSIVE METABOLIC PANEL - Abnormal; Notable for the following:    CO2 36 (*)    Glucose, Bld 132 (*)    BUN 29 (*)    Creatinine, Ser 1.50 (*)    AST 61 (*)    GFR calc non Af Amer 42 (*)    GFR calc Af Amer 48 (*)    All other components within normal limits  LIPASE, BLOOD - Abnormal; Notable for the following:    Lipase 20 (*)    All other components within normal limits  BRAIN NATRIURETIC PEPTIDE - Abnormal; Notable for the following:    B Natriuretic Peptide 835.0 (*)    All other components within normal limits  URINALYSIS, ROUTINE W REFLEX MICROSCOPIC (NOT AT [facility])  TROPONIN I  I-STAT CG4 LACTIC ACID, ED  POC OCCULT BLOOD, ED    Imaging  Review Dg Abd Acute W/chest  06/01/2014   CLINICAL DATA:  Chest/abdominal pain, shortness of breath  EXAM: DG ABDOMEN ACUTE W/ 1V CHEST  COMPARISON:  None.  FINDINGS: Lungs are clear.  No pleural effusion or pneumothorax.  The heart is normal in size.  Nonobstructive bowel gas pattern.  No evidence of free air under the diaphragm on the upright view.  Visualized osseous structures are within normal limits.  IMPRESSION: No evidence of acute cardiopulmonary disease.  No evidence of small bowel obstruction or free air.   Electronically Signed   By: Julian Hy M.D.   On: 06/01/2014 19:42     EKG Interpretation   Date/Time:  Monday Jun 01 2014 18:40:39 EDT Ventricular Rate:  76 PR Interval:  222 QRS Duration: 79 QT Interval:  385 QTC Calculation: 433 R Axis:   87 Text Interpretation:  Sinus rhythm Prolonged PR interval Borderline right  axis deviation Borderline T wave abnormalities Minimal ST elevation,  anterior leads No significant change since last tracing Confirmed by  POLLINA  MD, CHRISTOPHER [phone]) on 06/01/2014 6:44:22 PM      MDM   Final diagnoses:  Abdominal pain, acute  Chest pain, unspecified chest pain type   Chronic obstructive pulmonary disease, unspecified COPD, unspecified chronic bronchitis type  Gastrointestinal hemorrhage, unspecified gastritis, unspecified gastrointestinal hemorrhage type    Presents to the ER for evaluation of difficulty breathing. Patient was seen in the ER 2 nights ago for similar symptoms and discharged. Patient has 24-hour in-home care. EMS has been called to the house twice today because of shortness of breath. He was transported the second time. During transportation he was found to be significantly wheezing with decreased oxygen saturations. He was given 10 mg of albuterol and Solu-Medrol during transport and has improved. Patient reports that he did have chest pain and upper abdominal pain earlier, but that resolved before arrival to the ER. No recurrence. Patient's x-ray is clear, no evidence of pneumonia. He does not appear to have congestive heart failure. EKG is unchanged from previous. Troponin is negative. Patient has good oxygen saturation on his supplemental oxygen, but still appears to be somewhat short of breath. With ambulation, he did not experience desaturation, but he did develop tachycardia and weakness.  Lab work also reveals anemia, leukocytosis, as well as elevation of BUN/creatinine. He has had a significant bump and BUN and creatinine since his hospital visit 2 days ago. This is likely secondary to dehydration. Patient was found to be anemic. He was anemic at his previous visit as well, but his hemoglobin has dropped further. Rectal examination performed today was heme positive. He did complain of abdominal discomfort in the epigastric region earlier, but this has resolved and he has benign abdominal exam. Patient will require hospitalization for further management.   Orpah Greek, MD 06/01/14 2325

## 2014-06-01 NOTE — ED Notes (Signed)
Per EMS pt c/o CP/SOB that started this morning around 9am. EMS arrival pt noted to have wheezing and O2 84%. Pt received 1 albuterol and 1 duo neb en route. Pt sts he has no pain/sob at this time. Pt currently on Rm Air 02 100%. Pt has hx of dementia. Pt is from home and lives by himself.

## 2014-06-01 NOTE — H&P (Signed)
[facility] History and Physical  [employer]. ZMO:[id number] DOB: 07-15-1930 DOA: 06/01/2014  Referring physician: ED physician PCP: [name], PA-C  Specialists:   Chief Complaint: Shortness of breath, chest pain, abdominal pain, positive FOBT  HPI: Chase Johnson. is a 81 y.o. male with PMH of hypertension, hyperlipidemia, depression, PTSD, COPD, PAD, CAD, BPH, who presents with shortness of breath, chest pain, abdominal pain, and positive FOBT.   Patient reports that he has been having shortness of breath for about a week. He was evaluated in ED on 05/30/14. He had CXR on 05/30/14 which showed small bilateral pleural effusion and minimal right mid lung opacity. He was discharged on oral Levaquine, prednisone, albuterol nebulizer and Symbicort inhaler. He has been taking his medications, but without significant improvement. He still has shortness breath and mild dry cough, but no fever, chills.  Patient reports that he has had intermittent chest pain for several days, but currently no any chest pain. He also reports that he had abdominal pain early today, but no any abdominal pain currently. No symptoms of UTI. He was found to have positive FOBT, but denies bloody stool,  tarry stool or hematuria.   Currently patient denies fever, chills, running nose, ear pain, headaches, diarrhea, constipation, dysuria, urgency, frequency, hematuria, skin rashes or leg swelling. No unilateral weakness, numbness or tingling sensations. No vision change or hearing loss.  In ED, patient was found to have WBC 15.1 (patient is on prednisone), temperature normal, no tachycardia,  AKI, lactate 1.91, negative urinalysis, negative troponin, BNP 835, lipase 20. X-ray of the chest/abdomen has no acute abnormalities. She is admitted to inpatient for further evaluation and treatment.   Where does patient live?   At home alone (has caregiver living in next door) Can patient participate in ADLs?   Barely  Review of Systems:   General: no fevers, chills, no changes in body weight, has poor appetite, has fatigue HEENT: no blurry vision, hearing changes or sore throat Pulm: has dyspnea, coughing, No wheezing CV: had chest pain, no palpitations Abd: no nausea, vomiting, had abdominal pain, no diarrhea, constipation GU: no dysuria, burning on urination, increased urinary frequency, hematuria  Ext: no leg edema Neuro: no unilateral weakness, numbness, or tingling, no vision change or hearing loss Skin: no rash MSK: No muscle spasm, no deformity, no limitation of range of movement in spin Heme: No easy bruising.  Travel history: No recent long distant travel.  Allergy: No Known Allergies  Past Medical History  Diagnosis Date  . CKD (chronic kidney disease)   . CAD (coronary artery disease)   . HTN (hypertension)   . PTSD (post-traumatic stress disorder)   . Tobacco abuse   . Hyperlipidemia   . COPD (chronic obstructive pulmonary disease)   . Peripheral arterial disease   . BPH (benign prostatic hyperplasia)     Past Surgical History  Procedure Laterality Date  . Prostate surgery      for BPH, but not know the detail by pt    Social History:  reports that he quit smoking about 18 months ago. His smoking use included Cigarettes. He does not have any smokeless tobacco history on file. He reports that he does not drink alcohol. His drug history is not on file.  Family History:  Family History  Problem Relation Age of Onset  . Heart attack Father   . Hyperlipidemia Father   . Hypertension Father   . Stroke Father      Prior to Admission  medications   Medication Sig Start Date End Date Taking? Authorizing Provider  albuterol (PROVENTIL) (2.5 MG/3ML) 0.083% nebulizer solution Take 3 mLs (2.5 mg total) by nebulization every 4 (four) hours as needed for wheezing or shortness of breath. 05/30/14  Yes Evelina Bucy, MD  amLODipine (NORVASC) 10 MG tablet Take 10 mg by mouth  daily. 05/20/14  Yes Historical Provider, MD  atorvastatin (LIPITOR) 40 MG tablet Take 40 mg by mouth daily. 05/20/14  Yes Historical Provider, MD  budesonide-formoterol (SYMBICORT) 160-4.5 MCG/ACT inhaler Inhale 2 puffs into the lungs 2 (two) times daily.   Yes Historical Provider, MD  clopidogrel (PLAVIX) 75 MG tablet Take 75 mg by mouth daily. 05/20/14  Yes Historical Provider, MD  donepezil (ARICEPT) 5 MG tablet Take 5 mg by mouth at bedtime. 05/20/14  Yes Historical Provider, MD  furosemide (LASIX) 20 MG tablet Take 20 mg by mouth every Tuesday, Thursday, Saturday, and Sunday at 6 PM. 05/20/14  Yes Historical Provider, MD  haloperidol (HALDOL) 5 MG tablet Take 5-10 mg by mouth every 6 (six) hours as needed for agitation.  05/27/14  Yes Historical Provider, MD  levofloxacin (LEVAQUIN) 750 MG tablet Take 1 tablet (750 mg total) by mouth daily. 05/30/14  Yes Evelina Bucy, MD  metoprolol tartrate (LOPRESSOR) 25 MG tablet Take 37.5 mg by mouth 2 (two) times daily. 05/20/14  Yes Historical Provider, MD  nitroGLYCERIN (NITROSTAT) 0.4 MG SL tablet Place 0.4 mg under the tongue every 5 (five) minutes as needed for chest pain.   Yes Historical Provider, MD  predniSONE (DELTASONE) 20 MG tablet Take 3 tablets (60 mg total) by mouth daily. 05/31/14  Yes Evelina Bucy, MD  PROAIR HFA 108 (90 BASE) MCG/ACT inhaler Inhale 2 puffs into the lungs every 6 (six) hours as needed. 05/20/14  Yes Historical Provider, MD  QUEtiapine (SEROQUEL) 50 MG tablet Take 50 mg by mouth every morning. 05/20/14  Yes Historical Provider, MD  QUEtiapine Fumarate (SEROQUEL XR) 150 MG 24 hr tablet Take 150 mg by mouth at bedtime.   Yes Historical Provider, MD  SEROQUEL XR 200 MG 24 hr tablet Take 200 mg by mouth daily.  05/26/14  Yes Historical Provider, MD  tamsulosin (FLOMAX) 0.4 MG CAPS capsule Take 0.4 mg by mouth daily after supper.   Yes Historical Provider, MD    Physical Exam: Filed Vitals:   06/01/14 2330 06/02/14 0038 06/02/14 0130  06/02/14 0338  BP: 142/86 154/63  147/68  Pulse: 61 62  54  Temp:  98.1 F (36.7 C)  98.8 F (37.1 C)  TempSrc:  Oral  Oral  Resp: 15 16  16   Height:  6' (1.829 m)    Weight:  58.3 kg (128 lb 8.5 oz)  58.3 kg (128 lb 8.5 oz)  SpO2: 100% 100% 97% 100%   General: Not in acute distress HEENT:       Eyes: PERRL, EOMI, no scleral icterus.       ENT: No discharge from the ears and nose, no pharynx injection, no tonsillar enlargement.        Neck: No JVD, no bruit, no mass felt. Heme: No neck lymph node enlargement. Cardiac: S1/S2, RRR, No murmurs, No gallops or rubs. Pulm: has rhonchi and mild wheezing bilaterally. No rales or rubs. Abd: Soft, nondistended, nontender, no rebound pain, no organomegaly, BS present. Ext: No pitting leg edema bilaterally. 2+DP/PT pulse bilaterally. Musculoskeletal: No joint deformities, No joint redness or warmth, no limitation of ROM in spin. Skin: No rashes.  Neuro: Alert, oriented X3, cranial nerves II-XII grossly intact, muscle strength 5/5 in all extremities, sensation to light touch intact.  Psych: Patient is not psychotic, no suicidal or hemocidal ideation.  Labs on Admission:  Basic Metabolic Panel:  Recent Labs Lab 05/30/14 2026 06/01/14 1943  NA 144 144  K 4.5 5.0  CL 100* 103  CO2 36* 36*  GLUCOSE 101* 132*  BUN 16 29*  CREATININE 1.08 1.50*  CALCIUM 8.9 9.2   Liver Function Tests:  Recent Labs Lab 06/01/14 1943  AST 61*  ALT 54  ALKPHOS 77  BILITOT 0.5  PROT 6.6  ALBUMIN 3.6    Recent Labs Lab 06/01/14 1943  LIPASE 20*   No results for input(s): AMMONIA in the last 168 hours. CBC:  Recent Labs Lab 05/30/14 2026 06/01/14 1943  WBC 7.5 15.1*  NEUTROABS  --  13.6*  HGB 8.7* 8.3*  HCT 27.9* 27.7*  MCV 90.3 90.5  PLT 184 184   Cardiac Enzymes:  Recent Labs Lab 06/01/14 1943 06/02/14 0129  TROPONINI <0.03 <0.03    BNP (last 3 results)  Recent Labs  05/30/14 2026 06/01/14 1943  BNP 258.2* 835.0*     ProBNP (last 3 results) No results for input(s): PROBNP in the last 8760 hours.  CBG: No results for input(s): GLUCAP in the last 168 hours.  Radiological Exams on Admission: Dg Abd Acute W/chest  06/01/2014   CLINICAL DATA:  Chest/abdominal pain, shortness of breath  EXAM: DG ABDOMEN ACUTE W/ 1V CHEST  COMPARISON:  None.  FINDINGS: Lungs are clear.  No pleural effusion or pneumothorax.  The heart is normal in size.  Nonobstructive bowel gas pattern.  No evidence of free air under the diaphragm on the upright view.  Visualized osseous structures are within normal limits.  IMPRESSION: No evidence of acute cardiopulmonary disease.  No evidence of small bowel obstruction or free air.   Electronically Signed   By: Julian Hy M.D.   On: 06/01/2014 19:42    EKG: Independently reviewed.  Abnormal findings:  First degree AV block, nonspecific T-wave changes   Assessment/Plan Principal Problem:   SOB (shortness of breath) Active Problems:   Peripheral arterial disease   Hyperlipidemia   CAD (coronary artery disease)   HTN (hypertension)   COPD exacerbation   Fecal occult blood test positive   Chest pain   Abdominal pain   AKI (acute kidney injury)   BPH (benign prostatic hyperplasia)  SOB: Likely due to COPD exacerbation vs. mild pneumonia. Patient is not septic on admission. His leukocytosis is likely due to steroid-induced demargination.  -will admit patient to telemetry bed  -Nebulizers: scheduled Duoneb and prn xopenex -Solu-Medrol 40 mg twice a day -continue levaquin -Mucinex for cough  -Urine legionella and S. pneumococcal antigen -Follow up blood culture x2, sputum culture, respiratory virus panel  Chest pain: Patient had chest pain, which has resolved currently. Etiology is not clear. Will rule out ACS -check trop x 3 -continue nitroglycerin when necessary, morphine when necessary, metoprolol and Lipitor  Abdominal pain and positive FOBT: Patient had abdominal  pain earlier today, which has resolved. No diarrhea. Etiology is not clear. Patient has positive FOBT on admission, which may have contributed. hgb 8.7 on 05/30/14-->8.3, slightly decreased. This is likely due to Plavix use, but cannot rule out other possibilities, such as peptic ulcers disease. -Observe symptoms closely. -cbc q6h -inr/ptt/type and screen -hold plavix -protonix 40 mg bid IV  AKI: likely due to prerenal secondary to dehydration  and continuation of diruetics -IVF: NS 75 cc/h -Check FeUrea -US-renal -Follow up renal function by BMP -Avoid ACEI and NSAIDs -Hold Diuretics now  HTN:  -hold lasix -Continue amlodipine, metoprolol -Hydralazine IV when necessary  HLD: No LDL are recorded -Continue home medications: Lipitor -Check FLP  PAD:  -hold plavix due to positive FOBT  BPH: -flomax  Depression: Stable. No suicidal or homicidal ideations. -Continue Seroquel   DVT ppx: SCD  Code Status: Full code Family Communication: None at bed side.    Disposition Plan: Admit to inpatient   Date of Service 06/02/2014    Ivor Costa [facility] Pager [phone]  If 7PM-7AM, please contact night-coverage [email or web address] Password [email or web address] 06/02/2014, 3:52 AM

## 2014-06-02 ENCOUNTER — Inpatient Hospital Stay (HOSPITAL_COMMUNITY): Payer: Medicare Other

## 2014-06-02 ENCOUNTER — Encounter (HOSPITAL_COMMUNITY): Payer: Self-pay | Admitting: Internal Medicine

## 2014-06-02 DIAGNOSIS — R0602 Shortness of breath: Secondary | ICD-10-CM

## 2014-06-02 DIAGNOSIS — R195 Other fecal abnormalities: Secondary | ICD-10-CM

## 2014-06-02 DIAGNOSIS — R109 Unspecified abdominal pain: Secondary | ICD-10-CM

## 2014-06-02 DIAGNOSIS — N179 Acute kidney failure, unspecified: Secondary | ICD-10-CM | POA: Diagnosis present

## 2014-06-02 DIAGNOSIS — E785 Hyperlipidemia, unspecified: Secondary | ICD-10-CM

## 2014-06-02 DIAGNOSIS — R079 Chest pain, unspecified: Secondary | ICD-10-CM | POA: Insufficient documentation

## 2014-06-02 DIAGNOSIS — N400 Enlarged prostate without lower urinary tract symptoms: Secondary | ICD-10-CM | POA: Diagnosis present

## 2014-06-02 DIAGNOSIS — I739 Peripheral vascular disease, unspecified: Secondary | ICD-10-CM

## 2014-06-02 LAB — LIPID PANEL
Cholesterol: 136 mg/dL (ref 0–200)
HDL: 74 mg/dL (ref >40)
LDL Cholesterol: 56 mg/dL (ref 0–99)
Total CHOL/HDL Ratio: 1.8 RATIO
Triglycerides: 32 mg/dL (ref <150)
VLDL: 6 mg/dL (ref 0–40)

## 2014-06-02 LAB — CBC
HCT: 25.2 % — ABNORMAL LOW (ref 39.0–52.0)
HCT: 25.7 % — ABNORMAL LOW (ref 39.0–52.0)
Hemoglobin: 7.6 g/dL — ABNORMAL LOW (ref 13.0–17.0)
Hemoglobin: 7.9 g/dL — ABNORMAL LOW (ref 13.0–17.0)
MCH: 27.1 pg (ref 26.0–34.0)
MCH: 27.6 pg (ref 26.0–34.0)
MCHC: 30.2 g/dL (ref 30.0–36.0)
MCHC: 30.7 g/dL (ref 30.0–36.0)
MCV: 90 fL (ref 78.0–100.0)
MCV: 89.9 fL (ref 78.0–100.0)
Platelets: 143 10*3/uL — ABNORMAL LOW (ref 150–400)
Platelets: 151 10*3/uL (ref 150–400)
RBC: 2.8 MIL/uL — ABNORMAL LOW (ref 4.22–5.81)
RBC: 2.86 MIL/uL — ABNORMAL LOW (ref 4.22–5.81)
RDW: 14.6 % (ref 11.5–15.5)
RDW: 14.6 % (ref 11.5–15.5)
WBC: 11.9 10*3/uL — ABNORMAL HIGH (ref 4.0–10.5)
WBC: 12.6 10*3/uL — ABNORMAL HIGH (ref 4.0–10.5)

## 2014-06-02 LAB — TYPE AND SCREEN
ABO/RH(D): O POS
Antibody Screen: NEGATIVE

## 2014-06-02 LAB — GLUCOSE, CAPILLARY: Glucose-Capillary: 116 mg/dL — ABNORMAL HIGH (ref 65–99)

## 2014-06-02 LAB — PROTIME-INR
INR: 1.15 (ref 0.00–1.49)
Prothrombin Time: 14.9 seconds (ref 11.6–15.2)

## 2014-06-02 LAB — APTT: aPTT: 20 seconds — ABNORMAL LOW (ref 24–37)

## 2014-06-02 LAB — STREP PNEUMONIAE URINARY ANTIGEN: Strep Pneumo Urinary Antigen: NEGATIVE

## 2014-06-02 LAB — TROPONIN I
Troponin I: <0.03 ng/mL (ref <0.031)
Troponin I: <0.03 ng/mL (ref <0.031)

## 2014-06-02 LAB — CREATININE, URINE, RANDOM: Creatinine, Urine: 65.67 mg/dL

## 2014-06-02 LAB — ABO/RH: ABO/RH(D): O POS

## 2014-06-02 MED ORDER — TAMSULOSIN HCL 0.4 MG PO CAPS
0.4000 mg | ORAL_CAPSULE | Freq: Every day | ORAL | Status: DC
  Administered 2014-06-02 – 2014-06-03 (×2): 0.4 mg via ORAL
  Filled 2014-06-02 (×4): qty 1

## 2014-06-02 MED ORDER — HALOPERIDOL 5 MG PO TABS
5.0000 mg | ORAL_TABLET | Freq: Four times a day (QID) | ORAL | Status: DC | PRN
  Administered 2014-06-02: 5 mg via ORAL
  Filled 2014-06-02 (×3): qty 1

## 2014-06-02 MED ORDER — ONDANSETRON HCL 4 MG/2ML IJ SOLN: 4.0000 mg | Freq: Four times a day (QID) | INTRAMUSCULAR | Status: DC | PRN

## 2014-06-02 MED ORDER — LEVOFLOXACIN 750 MG PO TABS
750.0000 mg | ORAL_TABLET | ORAL | Status: DC
  Administered 2014-06-02 – 2014-06-04 (×2): 750 mg via ORAL
  Filled 2014-06-02 (×2): qty 1

## 2014-06-02 MED ORDER — QUETIAPINE FUMARATE ER 50 MG PO TB24
150.0000 mg | ORAL_TABLET | Freq: Every day | ORAL | Status: DC
  Administered 2014-06-02 – 2014-06-03 (×3): 150 mg via ORAL
  Filled 2014-06-02 (×4): qty 3

## 2014-06-02 MED ORDER — ALUM & MAG HYDROXIDE-SIMETH 200-200-20 MG/5ML PO SUSP
30.0000 mL | Freq: Four times a day (QID) | ORAL | Status: DC | PRN
  Administered 2014-06-02 – 2014-06-03 (×2): 30 mL via ORAL
  Filled 2014-06-02 (×2): qty 30

## 2014-06-02 MED ORDER — ACETAMINOPHEN 650 MG RE SUPP: 650.0000 mg | Freq: Four times a day (QID) | RECTAL | Status: DC | PRN

## 2014-06-02 MED ORDER — PANTOPRAZOLE SODIUM 40 MG IV SOLR
40.0000 mg | Freq: Two times a day (BID) | INTRAVENOUS | Status: DC
  Administered 2014-06-02 – 2014-06-04 (×5): 40 mg via INTRAVENOUS
  Filled 2014-06-02 (×6): qty 40

## 2014-06-02 MED ORDER — HEPARIN SODIUM (PORCINE) 5000 UNIT/ML IJ SOLN: 5000.0000 [IU] | Freq: Three times a day (TID) | INTRAMUSCULAR | Status: DC

## 2014-06-02 MED ORDER — METOPROLOL TARTRATE 25 MG PO TABS
37.5000 mg | ORAL_TABLET | Freq: Two times a day (BID) | ORAL | Status: DC
  Administered 2014-06-02 – 2014-06-04 (×6): 37.5 mg via ORAL
  Filled 2014-06-02 (×7): qty 1

## 2014-06-02 MED ORDER — SODIUM CHLORIDE 0.9 % IJ SOLN
3.0000 mL | Freq: Two times a day (BID) | INTRAMUSCULAR | Status: DC
  Administered 2014-06-03 – 2014-06-04 (×3): 3 mL via INTRAVENOUS

## 2014-06-02 MED ORDER — AMLODIPINE BESYLATE 10 MG PO TABS
10.0000 mg | ORAL_TABLET | Freq: Every day | ORAL | Status: DC
  Administered 2014-06-02 – 2014-06-04 (×3): 10 mg via ORAL
  Filled 2014-06-02 (×3): qty 1

## 2014-06-02 MED ORDER — IPRATROPIUM-ALBUTEROL 0.5-2.5 (3) MG/3ML IN SOLN: 3.0000 mL | RESPIRATORY_TRACT | Status: DC

## 2014-06-02 MED ORDER — HYDRALAZINE HCL 20 MG/ML IJ SOLN: 5.0000 mg | INTRAMUSCULAR | Status: DC | PRN

## 2014-06-02 MED ORDER — MORPHINE SULFATE 2 MG/ML IJ SOLN: 1.0000 mg | INTRAMUSCULAR | Status: DC | PRN

## 2014-06-02 MED ORDER — IPRATROPIUM-ALBUTEROL 0.5-2.5 (3) MG/3ML IN SOLN: 3.0000 mL | RESPIRATORY_TRACT | Status: DC | PRN

## 2014-06-02 MED ORDER — NITROGLYCERIN 0.4 MG SL SUBL: 0.4000 mg | SUBLINGUAL_TABLET | SUBLINGUAL | Status: DC | PRN

## 2014-06-02 MED ORDER — SODIUM CHLORIDE 0.9 % IV SOLN
INTRAVENOUS | Status: DC
Start: 2014-06-02 — End: 2014-06-04
  Administered 2014-06-02 (×2): via INTRAVENOUS

## 2014-06-02 MED ORDER — IPRATROPIUM-ALBUTEROL 0.5-2.5 (3) MG/3ML IN SOLN
3.0000 mL | RESPIRATORY_TRACT | Status: DC
  Filled 2014-06-02: qty 3

## 2014-06-02 MED ORDER — ACETAMINOPHEN 325 MG PO TABS
650.0000 mg | ORAL_TABLET | Freq: Four times a day (QID) | ORAL | Status: DC | PRN
  Administered 2014-06-02 – 2014-06-03 (×3): 650 mg via ORAL
  Filled 2014-06-02 (×3): qty 2

## 2014-06-02 MED ORDER — DONEPEZIL HCL 5 MG PO TABS
5.0000 mg | ORAL_TABLET | Freq: Every day | ORAL | Status: DC
  Administered 2014-06-02 – 2014-06-03 (×3): 5 mg via ORAL
  Filled 2014-06-02 (×4): qty 1

## 2014-06-02 MED ORDER — METHYLPREDNISOLONE SODIUM SUCC 40 MG IJ SOLR
40.0000 mg | Freq: Two times a day (BID) | INTRAMUSCULAR | Status: DC
  Administered 2014-06-02 – 2014-06-03 (×3): 40 mg via INTRAVENOUS
  Filled 2014-06-02 (×5): qty 1

## 2014-06-02 MED ORDER — ATORVASTATIN CALCIUM 40 MG PO TABS
40.0000 mg | ORAL_TABLET | Freq: Every day | ORAL | Status: DC
  Administered 2014-06-02 – 2014-06-04 (×3): 40 mg via ORAL
  Filled 2014-06-02 (×3): qty 1

## 2014-06-02 MED ORDER — DM-GUAIFENESIN ER 30-600 MG PO TB12
1.0000 | ORAL_TABLET | Freq: Two times a day (BID) | ORAL | Status: DC
  Administered 2014-06-02 – 2014-06-04 (×5): 1 via ORAL
  Filled 2014-06-02 (×6): qty 1

## 2014-06-02 MED ORDER — ONDANSETRON HCL 4 MG PO TABS: 4.0000 mg | ORAL_TABLET | Freq: Four times a day (QID) | ORAL | Status: DC | PRN

## 2014-06-02 MED ORDER — LEVALBUTEROL HCL 1.25 MG/0.5ML IN NEBU
1.2500 mg | INHALATION_SOLUTION | Freq: Four times a day (QID) | RESPIRATORY_TRACT | Status: DC
  Administered 2014-06-02: 1.25 mg via RESPIRATORY_TRACT
  Filled 2014-06-02 (×6): qty 0.5

## 2014-06-02 NOTE — Care Management (Signed)
Medicare Important Message given? Yes (If Response is "NO", the following Medicare IM given date fields will be blank) Date Medicare IM given: 06/02/14 Medicare IM given by: Elenor Quinones

## 2014-06-02 NOTE — Progress Notes (Signed)
UR Completed. Margaret Cockerill, RN, BSN.  [phone] 

## 2014-06-02 NOTE — Progress Notes (Addendum)
Occupational Therapy Evaluation Patient Details Name: Chase Johnson. MRN: [id number] DOB: 03-17-1930 Today's Date: 06/02/2014    History of Present Illness 81 y.o. male with PMH of hypertension, hyperlipidemia, depression, PTSD, COPD, PAD, CAD, BPH, who presents with shortness of breath, chest pain, abdominal pain, and positive FOBT.    Clinical Impression   PTA, pt apparently lived alone and had a caregiver/friend who stayed with him during the day to assist as needed. Pt's wife lives at [facility].  Pt states he was independent with ADL and mobility PTA. No caregiver present to verify information. Pt appeared to present with minimal confusion, however, pt had Haldol this am. At this time, feel pt will be able to D/C home with [facility] and 24/7 S initially. Will follow to address established goals.     Follow Up Recommendations  Home health OT;Supervision/Assistance - 24 hour    Equipment Recommendations  None recommended by OT    Recommendations for Other Services       Precautions / Restrictions Precautions Precautions: Fall Restrictions Weight Bearing Restrictions: No  Uses O2 at baseline. Pt states 3 L     Mobility Bed Mobility Overal bed mobility: Modified Independent                Transfers Overall transfer level: Needs assistance Equipment used: 1 person hand held assist Transfers: Sit to/from [facility] Sit to Stand: Min guard Stand pivot transfers: Min guard       General transfer comment: occasional posterior sway noted. worsened with activity. ? due to decreased O2 SATS    Balance Overall balance assessment: Needs assistance   Sitting balance-Leahy Scale: Good     Standing balance support: During functional activity Standing balance-Leahy Scale: Fair Standing balance comment: will further assess                            ADL Overall ADL's : Needs assistance/impaired     Grooming: Set up   Upper Body  Bathing: Set up   Lower Body Bathing: Minimal assistance   Upper Body Dressing : Set up   Lower Body Dressing: Minimal assistance;Sit to/from stand   Toilet Transfer: Minimal assistance   Toileting- Clothing Manipulation and Hygiene: Total assistance Toileting - Clothing Manipulation Details (indicate cue type and reason): incontinene episode. Pt up in room trying to get to bathroom     Functional mobility during ADLs: Minimal assistance (steady)  Desat to 89 RA during ADL. Pt had removed O2 trying to get to bathroom.                     Pertinent Vitals/Pain Pain Assessment: No/denies pain     Hand Dominance Right   Extremity/Trunk Assessment Upper Extremity Assessment Upper Extremity Assessment: Generalized weakness   Lower Extremity Assessment Lower Extremity Assessment: Defer to PT evaluation   Cervical / Trunk Assessment Cervical / Trunk Assessment: Kyphotic   Communication Communication Communication: No difficulties   Cognition Arousal/Alertness: Awake/alert Behavior During Therapy: Flat affect;Restless Overall Cognitive Status: No family/caregiver present to determine baseline cognitive functioning (Most likely at baseline. 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                      General Comments   Pt restless initially. After talking with the pt, he calmed and had a good conversation with this therapist regarding his  friends that could help after D/C. Pt appeared anxious about being incontinent and not being able to make it to the bathroom.                 Home Living Family/patient expects to be discharged to:: Private residence Living Arrangements: Alone;Other (Comment) Available Help at Discharge: Other (Comment);Friend(s) (unsure if 24/7; wife lives at [facility]) Type of Home: House Home Access: Stairs to enter [employer] of Steps: 3 Entrance Stairs-Rails: Can reach both Home Layout:  One level     Bathroom Shower/Tub: Occupational psychologist: Handicapped height Bathroom Accessibility: Yes How Accessible: Accessible via walker Home Equipment: [place] - 2 wheels;Bedside commode;Shower seat - built in          Prior Functioning/Environment Level of Independence: Needs assistance  Gait / Transfers Assistance Needed: independent ADL's / Homemaking Assistance Needed: States he performs his ADL; friend cooks/cleans and takes him to appts        OT Diagnosis: Generalized weakness;Altered mental status (?related to Haldol)   OT Problem List: Decreased strength;Decreased activity tolerance;Impaired balance (sitting and/or standing);Decreased safety awareness;Cardiopulmonary status limiting activity   OT Treatment/Interventions: Self-care/ADL training;Therapeutic exercise;Energy conservation;DME and/or AE instruction;Therapeutic activities;Patient/family education;Balance training    OT Goals(Current goals can be found in the care plan section) Acute Rehab OT Goals Patient Stated Goal: to get something to eat OT Goal Formulation: With patient Time For Goal Achievement: 06/16/14 Potential to Achieve Goals: Good  OT Frequency: Min 2X/week   Barriers to D/C: Other (comment) (unsure of caregiver support)          Co-evaluation              End of Session Nurse Communication: Mobility status  Activity Tolerance: Patient tolerated treatment well Patient left: in bed;with call bell/phone within reach;with bed alarm set   Time: 2947-6546 OT Time Calculation (min): 38 min Charges:  OT General Charges $OT Visit: 1 Procedure OT Evaluation $Initial OT Evaluation Tier I: 1 Procedure OT Treatments $Self Care/Home Management : 23-37 mins G-Codes:    [name],HILLARY 06/28/14, 11:53 AM   Maurie Boettcher, OTR/L  [phone] 2014-06-28

## 2014-06-02 NOTE — Progress Notes (Signed)
PT Cancellation Note  Patient Details Name: Chase Johnson. MRN: [id number] DOB: August 13, 1930   Cancelled Treatment:    Reason Eval/Treat Not Completed: Patient not medically ready. Discussed pt case with RN who states that pt has had a tremor all morning (likely from PTSD) and has had Haldol to calm him down. As pt still has an active bedrest order as well, RN requesting that therapy holds until MD rounds this morning. Will check back as able to complete PT eval.    Rolinda Roan 06/02/2014, 8:45 AM  Rolinda Roan, PT, DPT Acute Rehabilitation Services Pager: [phone]

## 2014-06-02 NOTE — Progress Notes (Signed)
[facility] PROGRESS NOTE   Chase Johnson. WUJ:[id number] DOB: 1930/08/13 DOA: 06/01/2014 PCP: [name], PA-C  HPI/Subjective: Denies any new complaints, denies any chest pain or shortness of breath. Has some abdominal discomfort.  Assessment/Plan: Principal Problem:   SOB (shortness of breath) Active Problems:   Peripheral arterial disease   Hyperlipidemia   CAD (coronary artery disease)   HTN (hypertension)   COPD exacerbation   Fecal occult blood test positive   Chest pain   Abdominal pain   AKI (acute kidney injury)   BPH (benign prostatic hyperplasia)    Acute COPD exacerbation -Presented with shortness of breath, sputum production and minimal wheezing -Started on IV steroids, IV antibiotics. CXR showed no infiltrates. -Supportive management with broncho-dilators, mucolytic and oxygen. -Urine legionella and S. pneumococcal antigen pending -Follow up blood culture x2, sputum culture, respiratory virus panel  Chest pain -Chest pain is likely related to his COPD exacerbation, this is resolved. -Was placed on nitroglycerin as necessary, not used it. -No changes on EKG, 3 sets of cardiac enzymes ruled out ACS.  Abdominal pain and positive FOBT -Had abdominal pain on the day of mission, associated with FOBT. -Patient is on Plavix, unknown baseline for hemoglobin. -Hemoglobin dropped from 8.7 down to 7.9, but not overtly bleeding, Plavix held -Started on Protonix 40 mg twice a day, if hemoglobin dropped further, we will ask GI to evaluate.  AKI:  -Likely prerenal acute kidney injury secondary to dehydration from diuretics use. -Renal ultrasound showed cortical thinning but no evidence of obstruction. -Hold diuretics, avoid ACEI and NSAIDs. -Follow BMP in a.m.  HTN:  -hold lasix  -Continue amlodipine, metoprolol -Hydralazine IV when necessary  HLD: No LDL are recorded -Continue home medications: Lipitor -Check FLP  PAD:  -Hold plavix due to  positive FOBT  BPH: -flomax  Depression: Stable. No suicidal or homicidal ideations. -Continue Seroquel   Code Status: Full Code Family Communication: Plan discussed with the patient. Disposition Plan: Remains inpatient Diet: Diet NPO time specified  Consultants:  None  Procedures:  None  Antibiotics:  None   Objective: Filed Vitals:   06/02/14 1329  BP: 159/81  Pulse: 59  Temp: 97.7 F (36.5 C)  Resp: 18    Intake/Output Summary (Last 24 hours) at 06/02/14 1410 Last data filed at 06/02/14 1300  Gross per 24 hour  Intake      0 ml  Output    775 ml  Net   -775 ml   Filed Weights   06/01/14 1851 06/02/14 0038 06/02/14 0338  Weight: 62.143 kg (137 lb) 58.3 kg (128 lb 8.5 oz) 58.3 kg (128 lb 8.5 oz)    Exam: General: Alert and awake, oriented x3, not in any acute distress. HEENT: anicteric sclera, pupils reactive to light and accommodation, EOMI CVS: S1-S2 clear, no murmur rubs or gallops Chest: clear to auscultation bilaterally, no wheezing, rales or rhonchi Abdomen: soft nontender, nondistended, normal bowel sounds, no organomegaly Extremities: no cyanosis, clubbing or edema noted bilaterally Neuro: Cranial nerves II-XII intact, no focal neurological deficits  Data Reviewed: Basic Metabolic Panel:  Recent Labs Lab 05/30/14 2026 06/01/14 1943  NA 144 144  K 4.5 5.0  CL 100* 103  CO2 36* 36*  GLUCOSE 101* 132*  BUN 16 29*  CREATININE 1.08 1.50*  CALCIUM 8.9 9.2   Liver Function Tests:  Recent Labs Lab 06/01/14 1943  AST 61*  ALT 54  ALKPHOS 77  BILITOT 0.5  PROT 6.6  ALBUMIN 3.6  Recent Labs Lab 06/01/14 1943  LIPASE 20*   No results for input(s): AMMONIA in the last 168 hours. CBC:  Recent Labs Lab 05/30/14 2026 06/01/14 1943 06/02/14 0405 06/02/14 0636  WBC 7.5 15.1* 11.9* 12.6*  NEUTROABS  --  13.6*  --   --   HGB 8.7* 8.3* 7.6* 7.9*  HCT 27.9* 27.7* 25.2* 25.7*  MCV 90.3 90.5 90.0 89.9  PLT 184 184 143* 151    Cardiac Enzymes:  Recent Labs Lab 06/01/14 1943 06/02/14 0129 06/02/14 0636  TROPONINI <0.03 <0.03 <0.03   BNP (last 3 results)  Recent Labs  05/30/14 2026 06/01/14 1943  BNP 258.2* 835.0*    ProBNP (last 3 results) No results for input(s): PROBNP in the last 8760 hours.  CBG:  Recent Labs Lab 06/02/14 0840  GLUCAP 116*    Micro No results found for this or any previous visit (from the past 240 hour(s)).   Studies: US Renal  06/02/2014   CLINICAL DATA:  Acute renal insufficiency  EXAM: RENAL / URINARY TRACT ULTRASOUND COMPLETE  COMPARISON:  renal ultrasound of March 14, 2014  FINDINGS: Right Kidney:  Length: 8.7 cm. The renal cortical echotexture is increased and greater than that of the adjacent liver. There is no hydronephrosis. There is no focal mass.  Left Kidney:  Length: 10.0 cm. The renal cortical echotexture remains increased. There is no hydronephrosis nor focal mass.  Bladder:  The urinary bladder is moderately distended. Bilateral ureteral jets are visible. The prostate gland is enlarged measuring 4 point 8 x 3.6 x 4.9 cm.  IMPRESSION: 1. Increased renal cortical echotexture persists consistent with medical renal disease. There is no parenchymal mass. 2. There is no hydronephrosis. The urinary bladder remains mildly distended but bilateral ureteral jets are visible bilaterally. 3. There is mild enlargement of the prostate gland.   Electronically Signed   By: David  Martinique M.D.   On: 06/02/2014 07:23   Dg Abd Acute W/chest  06/01/2014   CLINICAL DATA:  Chest/abdominal pain, shortness of breath  EXAM: DG ABDOMEN ACUTE W/ 1V CHEST  COMPARISON:  None.  FINDINGS: Lungs are clear.  No pleural effusion or pneumothorax.  The heart is normal in size.  Nonobstructive bowel gas pattern.  No evidence of free air under the diaphragm on the upright view.  Visualized osseous structures are within normal limits.  IMPRESSION: No evidence of acute cardiopulmonary disease.  No  evidence of small bowel obstruction or free air.   Electronically Signed   By: Julian Hy M.D.   On: 06/01/2014 19:42    Scheduled Meds: . amLODipine  10 mg Oral Daily  . atorvastatin  40 mg Oral Daily  . dextromethorphan-guaiFENesin  1 tablet Oral BID  . donepezil  5 mg Oral QHS  . levofloxacin  750 mg Oral Q48H  . methylPREDNISolone (SOLU-MEDROL) injection  40 mg Intravenous Q12H  . metoprolol tartrate  37.5 mg Oral BID  . pantoprazole (PROTONIX) IV  40 mg Intravenous Q12H  . QUEtiapine Fumarate  150 mg Oral QHS  . sodium chloride  3 mL Intravenous Q12H  . tamsulosin  0.4 mg Oral QPC supper   Continuous Infusions: . sodium chloride 75 mL/hr at 06/02/14 0151       Time spent: 35 minutes    [facility] A  [facility] Pager [phone] If 7PM-7AM, please contact night-coverage at [email or web address], password [facility] 06/02/2014, 2:10 PM  LOS: 1 day

## 2014-06-02 NOTE — ED Notes (Signed)
Call Rulon Eisenmenger POA at [phone] for questions or update

## 2014-06-03 DIAGNOSIS — N179 Acute kidney failure, unspecified: Secondary | ICD-10-CM

## 2014-06-03 DIAGNOSIS — N400 Enlarged prostate without lower urinary tract symptoms: Secondary | ICD-10-CM

## 2014-06-03 DIAGNOSIS — J441 Chronic obstructive pulmonary disease with (acute) exacerbation: Principal | ICD-10-CM

## 2014-06-03 DIAGNOSIS — J181 Lobar pneumonia, unspecified organism: Secondary | ICD-10-CM

## 2014-06-03 LAB — CBC
HCT: 27.5 % — ABNORMAL LOW (ref 39.0–52.0)
HEMOGLOBIN: 8.5 g/dL — AB (ref 13.0–17.0)
MCH: 27.3 pg (ref 26.0–34.0)
MCHC: 30.9 g/dL (ref 30.0–36.0)
MCV: 88.4 fL (ref 78.0–100.0)
Platelets: 170 10*3/uL (ref 150–400)
RBC: 3.11 MIL/uL — AB (ref 4.22–5.81)
RDW: 14.6 % (ref 11.5–15.5)
WBC: 12 10*3/uL — AB (ref 4.0–10.5)

## 2014-06-03 LAB — LEGIONELLA ANTIGEN, URINE

## 2014-06-03 LAB — BASIC METABOLIC PANEL
Anion gap: 10 (ref 5–15)
BUN: 30 mg/dL — ABNORMAL HIGH (ref 6–20)
CALCIUM: 9.3 mg/dL (ref 8.9–10.3)
CO2: 32 mmol/L (ref 22–32)
CREATININE: 1.41 mg/dL — AB (ref 0.61–1.24)
Chloride: 101 mmol/L (ref 101–111)
GFR calc Af Amer: 52 mL/min — ABNORMAL LOW (ref >60)
GFR, EST NON AFRICAN AMERICAN: 45 mL/min — AB (ref >60)
Glucose, Bld: 102 mg/dL — ABNORMAL HIGH (ref 65–99)
POTASSIUM: 5 mmol/L (ref 3.5–5.1)
Sodium: 143 mmol/L (ref 135–145)

## 2014-06-03 LAB — GLUCOSE, CAPILLARY: Glucose-Capillary: 101 mg/dL — ABNORMAL HIGH (ref 65–99)

## 2014-06-03 LAB — UREA NITROGEN, URINE: UREA NITROGEN UR: 625 mg/dL

## 2014-06-03 MED ORDER — PREDNISONE 50 MG PO TABS
50.0000 mg | ORAL_TABLET | Freq: Every day | ORAL | Status: DC
  Administered 2014-06-04: 50 mg via ORAL
  Filled 2014-06-03 (×2): qty 1

## 2014-06-03 MED ORDER — HYDRALAZINE HCL 20 MG/ML IJ SOLN: 10.0000 mg | INTRAMUSCULAR | Status: DC | PRN

## 2014-06-03 NOTE — Progress Notes (Addendum)
Patient ID: Chase Tabar., male   DOB: [birth year], 81 y.o.   MRN: [id number] [facility] PROGRESS NOTE  Chase Skill. YNW:[id number] DOB: [birth year] DOA: 06/01/2014 PCP: [name], PA-C  Brief narrative:    74 -year-old male with past medical history of depression, hypertension, dyslipidemia, coronary artery disease, dementia, COPD, BPH who presented to [facility] with reports of worsening shortness of breath, chest pain for past couple of days prior to this admission. Patient was seen in emergency room on 05/30/2014 for similar problem at which time chest x-ray obtained showed small bilateral pleural effusion and minimal right midlung opacity. He was discharged home with oral Levaquin, prednisone and nebulizer treatments. Patient reported no significant symptomatic relief while at home. His shortness of breath progressed.  On admission patient was hemodynamically stable. His [place] blood cell count was notable for leukocytosis of 15.1, creatinine of 1.5, lactate 1.91. The troponin level was within normal limits. BNP was 835. Because pt also complaints of abdominal pain abdominal x ray was done but showed no acute intra-abdominal findings.   Patient was admitted for management of acute COPD exacerbation.   Assessment/Plan:    Acute COPD exacerbation / Possible lobar pneumonia / Leukocytosis  - Patient presented with reports of shortness of breath on the admission. - X-ray studies did not reveal acute cardiopulmonary process. - He was started on Solu-Medrol, nebulizer treatment as needed. Patient was also started on Levaquin for possible pneumonia. - Blood cultures, Legionella and strep pneumonia are all negative to date. - Respiratory status is stable. - We will switch Solu-Medrol to prednisone today in anticipation of discharge tomorrow.  Chest pain - Likely related to acute COPD exacerbation. - Troponin level was within normal limits. - No acute ischemic  findings on 12-lead EKG  Abdominal pain and positive FOBT - No acute intra-abdominal findings seen on x-ray studies. - Plavix held because of drop in hemoglobin from 8.7-7.9. Hemoglobin this morning is 8.5. - Restart Plavix and check CBC tomorrow morning. - Continue Protonix 40 mg every 12 hours.  Acute renal failure  - Likely from Lasix which was placed on hold. - Creatinine improved with IV fluids. - Since we have changed diet to regular we will stop IV fluids today. - Check BMP tomorrow morning. - Renal ultrasound showed cortical thinning but no evidence of obstruction.  Essential hypertension - Continue Norvasc and metoprolol  Dyslipidemia - Continue Lipitor 40 mg daily  BPH - Continue Flomax  Depression - Stable. Continue Seroquel   DVT Prophylaxis  - SCD's bilaterally    Code Status: Full.  Family Communication:  plan of care discussed with the patient Disposition Plan: Home likely in next 1-2 days if patient feels better.    IV access:  Peripheral IV  Procedures and diagnostic studies:    Dg Chest 2 View 05/30/2014   Small bilateral pleural effusions noted; minimal right mid lung opacity may reflect atelectasis or possibly mild pneumonia.    US Renal 06/02/2014   1. Increased renal cortical echotexture persists consistent with medical renal disease. There is no parenchymal mass. 2. There is no hydronephrosis. The urinary bladder remains mildly distended but bilateral ureteral jets are visible bilaterally. 3. There is mild enlargement of the prostate gland.    Dg Abd Acute W/chest 06/01/2014   No evidence of acute cardiopulmonary disease.  No evidence of small bowel obstruction or free air.    Medical Consultants:  None   Other Consultants:  Physical therapy  IAnti-Infectives:  Levaquin 06/02/2014 -->    DEVINE, Dedra Skeens, MD  [facility] Pager [phone]  Time spent in minutes: 25 minutes  If 7PM-7AM, please contact  night-coverage [email or web address] Password [facility] 06/03/2014, 11:32 AM   LOS: 2 days    HPI/Subjective: No acute overnight events. Patient reports pain to be along the right side of the ribs, 5/10 in intensity.   Objective: Filed Vitals:   06/02/14 1329 06/02/14 1948 06/02/14 2237 06/03/14 0349  BP: 159/81 157/67 174/78 184/72  Pulse: 59 66 69 71  Temp: 97.7 F (36.5 C) 98.4 F (36.9 C)  98.2 F (36.8 C)  TempSrc: Oral Oral  Oral  Resp: 18 18  20   Height:      Weight:    57.8 kg (127 lb 6.8 oz)  SpO2: 96% 100%  94%    Intake/Output Summary (Last 24 hours) at 06/03/14 1132 Last data filed at 06/03/14 0900  Gross per 24 hour  Intake    800 ml  Output   1400 ml  Net   -600 ml    Exam:   General:  Pt is alert, follows commands appropriately, not in acute distress  Cardiovascular: Regular rate and rhythm, S1/S2 (+), faint SEM appreciated   Respiratory: Clear to auscultation bilaterally, no wheezing, no crackles, no rhonchi  Abdomen: Soft, non tender, non distended, bowel sounds present  Extremities: No edema, pulses DP and PT palpable bilaterally  Neuro: Grossly nonfocal  Data Reviewed: Basic Metabolic Panel:  Recent Labs Lab 05/30/14 2026 06/01/14 1943 06/03/14 0403  NA 144 144 143  K 4.5 5.0 5.0  CL 100* 103 101  CO2 36* 36* 32  GLUCOSE 101* 132* 102*  BUN 16 29* 30*  CREATININE 1.08 1.50* 1.41*  CALCIUM 8.9 9.2 9.3   Liver Function Tests:  Recent Labs Lab 06/01/14 1943  AST 61*  ALT 54  ALKPHOS 77  BILITOT 0.5  PROT 6.6  ALBUMIN 3.6    Recent Labs Lab 06/01/14 1943  LIPASE 20*   No results for input(s): AMMONIA in the last 168 hours. CBC:  Recent Labs Lab 05/30/14 2026 06/01/14 1943 06/02/14 0405 06/02/14 0636 06/03/14 0403  WBC 7.5 15.1* 11.9* 12.6* 12.0*  NEUTROABS  --  13.6*  --   --   --   HGB 8.7* 8.3* 7.6* 7.9* 8.5*  HCT 27.9* 27.7* 25.2* 25.7* 27.5*  MCV 90.3 90.5 90.0 89.9 88.4  PLT 184 184 143* 151 170   Cardiac  Enzymes:  Recent Labs Lab 06/01/14 1943 06/02/14 0129 06/02/14 0636  TROPONINI <0.03 <0.03 <0.03   BNP: Invalid input(s): POCBNP CBG:  Recent Labs Lab 06/02/14 0840 06/03/14 0634  GLUCAP 116* 101*    Recent Results (from the past 240 hour(s))  Culture, blood (routine x 2) Call MD if unable to obtain prior to antibiotics being given     Status: None (Preliminary result)   Collection Time: 06/02/14  4:00 AM  Result Value Ref Range Status   Specimen Description BLOOD LEFT HAND  Final   Special Requests BOTTLES DRAWN AEROBIC AND ANAEROBIC 5CC  Final   Culture   Final           BLOOD CULTURE RECEIVED NO GROWTH TO DATE CULTURE WILL BE HELD FOR 5 DAYS BEFORE ISSUING A FINAL NEGATIVE REPORT Performed at [facility]    Report Status PENDING  Incomplete  Culture, blood (routine x 2) Call MD if unable to obtain prior to antibiotics being given     Status: None (  Preliminary result)   Collection Time: 06/02/14  4:05 AM  Result Value Ref Range Status   Specimen Description BLOOD LEFT ARM  Final   Special Requests BOTTLES DRAWN AEROBIC AND ANAEROBIC 5CC EACH   Final   Culture   Final           BLOOD CULTURE RECEIVED NO GROWTH TO DATE CULTURE WILL BE HELD FOR 5 DAYS BEFORE ISSUING A FINAL NEGATIVE REPORT Performed at [facility]    Report Status PENDING  Incomplete     Scheduled Meds: . amLODipine  10 mg Oral Daily  . atorvastatin  40 mg Oral Daily  . dextromethorphan-guaiF  1 tablet Oral BID  . donepezil  5 mg Oral QHS  . levofloxacin  750 mg Oral Q48H  . methylPREDNISolone   40 mg Intravenous Q12H  . metoprolol tartrate  37.5 mg Oral BID  . pantoprazole   40 mg Intravenous Q12H  . QUEtiapine Fumarate  150 mg Oral QHS  . tamsulosin  0.4 mg Oral QPC supper   Continuous Infusions: . sodium chloride 75 mL/hr at 06/02/14 1436

## 2014-06-03 NOTE — Evaluation (Signed)
Physical Therapy Evaluation Patient Details Name: Chase Johnson. MRN: [id number] DOB: 02-19-1930 Today's Date: 06/03/2014   History of Present Illness  81 y.o. male with PMH of hypertension, hyperlipidemia, depression, PTSD, COPD, PAD, CAD, BPH, who presents with shortness of breath, chest pain, abdominal pain, and positive FOBT.   Clinical Impression  Pt admitted with above diagnosis. Pt currently with functional limitations due to the deficits listed below (see PT Problem List). At the time of PT eval pt was able to perform transfers and ambulation with min assist for balance support. Pt will benefit from skilled PT to increase their independence and safety with mobility to allow discharge to the venue listed below. Pt reports that he will have assist at home from a neighbor - unsure how much assist will be available. If 24 hour is not available and pt does not make progress with therapy, may want to consider SNF at d/c for continued therapy services.      Follow Up Recommendations Home health PT;Supervision/Assistance - 24 hour    Equipment Recommendations  None recommended by PT    Recommendations for Other Services       Precautions / Restrictions Precautions Precautions: Fall Restrictions Weight Bearing Restrictions: No      Mobility  Bed Mobility Overal bed mobility: Needs Assistance Bed Mobility: Supine to Sit     Supine to sit: Min guard     General bed mobility comments: Pt required assist to manage bed linen, and min guard assist was provided to transition to full sitting position.  Transfers Overall transfer level: Needs assistance Equipment used: 1 person hand held assist   Sit to Stand: Min guard         General transfer comment: Close guard for safety as pt powers-up to full standing position. Balance appears decreased however could   Ambulation/Gait Ambulation/Gait assistance: Min assist Ambulation Distance (Feet): 200 Feet Assistive device:  Rolling walker (2 wheeled) Gait Pattern/deviations: Step-through pattern;Decreased stride length;Staggering left;Staggering right Gait velocity: Decreased` Gait velocity interpretation: Below normal speed for age/gender General Gait Details: Pt required occasional assist to maintain balance during ambulation. Pt does not appear to be aware of balance deficits.   Stairs            Wheelchair Mobility    Modified Rankin (Stroke Patients Only)       Balance Overall balance assessment: Needs assistance Sitting-balance support: Feet supported;No upper extremity supported Sitting balance-Leahy Scale: Good     Standing balance support: No upper extremity supported;During functional activity Standing balance-Leahy Scale: Poor Standing balance comment: Requires close guard for assist during static standing to use the urinal. Noted posterior lean                             Pertinent Vitals/Pain Pain Assessment: No/denies pain    Home Living Family/patient expects to be discharged to:: Private residence Living Arrangements: Alone;Other (Comment) Available Help at Discharge: Other (Comment);Friend(s) (unsure if 24/7; wife lives at [employer]) Type of Home: House Home Access: Stairs to enter Entrance Stairs-Rails: Can reach both Entrance Stairs-Number of Steps: 3 Home Layout: One level Home Equipment: [place] - 2 wheels;Bedside commode;Shower seat - built in      Prior Function Level of Independence: Needs assistance   Gait / Transfers Assistance Needed: independent  ADL's / Homemaking Assistance Needed: States he performs his ADL; friend cooks/cleans and takes him to appts        Hand  Dominance   Dominant Hand: Right    Extremity/Trunk Assessment   Upper Extremity Assessment: Defer to OT evaluation           Lower Extremity Assessment: Generalized weakness      Cervical / Trunk Assessment: Kyphotic  Communication   Communication: No  difficulties  Cognition Arousal/Alertness: Awake/alert Behavior During Therapy: Flat affect;Restless Overall Cognitive Status: No family/caregiver present to determine baseline cognitive functioning (Most likely at baseline. Pt had Haldol this am)                      General Comments      Exercises        Assessment/Plan    PT Assessment Patient needs continued PT services  PT Diagnosis Difficulty walking;Generalized weakness   PT Problem List Decreased strength;Decreased range of motion;Decreased activity tolerance;Decreased balance;Decreased mobility;Decreased knowledge of use of DME;Decreased safety awareness;Decreased knowledge of precautions;Cardiopulmonary status limiting activity  PT Treatment Interventions DME instruction;Gait training;Stair training;Functional mobility training;Therapeutic activities;Therapeutic exercise;Neuromuscular re-education;Patient/family education   PT Goals (Current goals can be found in the Care Plan section) Acute Rehab PT Goals Patient Stated Goal: to get something to eat PT Goal Formulation: With patient Time For Goal Achievement: 06/10/14 Potential to Achieve Goals: Good    Frequency Min 3X/week   Barriers to discharge Decreased caregiver support Unsure how much support pt will have at home - 24 hours assist?    Co-evaluation               End of Session Equipment Utilized During Treatment: Gait belt;Oxygen Activity Tolerance: Patient tolerated treatment well Patient left: in chair;with call bell/phone within reach;with chair alarm set Nurse Communication: Mobility status         Time: 4383-8184 PT Time Calculation (min) (ACUTE ONLY): 32 min   Charges:   PT Evaluation $Initial PT Evaluation Tier I: 1 Procedure PT Treatments $Gait Training: 8-22 mins   PT G Codes:        Rolinda Roan 2014/06/16, 1:25 PM  Rolinda Roan, PT, DPT Acute Rehabilitation Services Pager: [phone]

## 2014-06-03 NOTE — Clinical Documentation Improvement (Signed)
MD's, NP's, and PA's    Noted "positive FOBT"  during admission workup.  ED MD documented Gastris/Gastrointestinal hemorrhage.  Please document if either of these diagnoses were ruled in or out.  Thank you    Possible Conditions   Gastritis ruled in or ruled out  Gastrointestinal hemorrhage ruled in or ruled out  Other  Not able to determine     Thank you,  Ree Kida ,RN Clinical Documentation Specialist:  [place] [facility]

## 2014-06-03 NOTE — Clinical Documentation Improvement (Signed)
Md's, NP's, and PA's   Noted H/H as below , please document clinical significance of lab values if appropriate for this admission.  Thank you    Hemoglobin 13.0 - 17.0 g/dL 8.5 (L) 7.9 (L) 7.6 (L) 8.3 (L) 8.7 (L)    HCT 39.0 - 52.0 % 27.5 (L) 25.7 (L) 25.2 (L) 27.7 (L)           Possible Clinical Conditions?    Anemia of/in chronic condition Iron deficiency anemia Pernicious anemia Chronic blood loss anemia   Chronic anemia  Due to/in/with chronic kidney disease  Due to/in/with kidney failure  Macrocytic anemia  Microcytic anemia  Normocytic anemia   Precipitous drop in HGB                      Other Condition                 Cannot Clinically Determine    Risk Factors: AKI, dehydration   Diagnosis: pos FOBT  Treatment: Evaluated   Thank You, Ree Kida ,RN Clinical Documentation Specialist:  [place] [facility]

## 2014-06-04 DIAGNOSIS — I10 Essential (primary) hypertension: Secondary | ICD-10-CM

## 2014-06-04 LAB — GLUCOSE, CAPILLARY: Glucose-Capillary: 102 mg/dL — ABNORMAL HIGH (ref 65–99)

## 2014-06-04 MED ORDER — ALBUTEROL SULFATE (2.5 MG/3ML) 0.083% IN NEBU: 2.5000 mg | INHALATION_SOLUTION | RESPIRATORY_TRACT | Status: AC | PRN

## 2014-06-04 NOTE — Progress Notes (Signed)
06/04/2014 3:51 PM Discharge AVS meds taken today and those due this evening reviewed.  Follow-up appointments and when to call md reviewed.  D/C IV and TELE.  Questions and concerns addressed.   D/C home per orders.  Pt discharged home with POA Charlotte Sanes. [place]

## 2014-06-04 NOTE — Discharge Instructions (Signed)

## 2014-06-04 NOTE — Care Management Note (Signed)
Case Management Note  Patient Details  Name: Chase Johnson. MRN: [id number] Date of Birth: 1930/01/07  Subjective/Objective:    Pt admitted with SOB                Action/Plan:  Pt will go home with home health services.  CM asked for MD to write PT recommendations for home health.   Expected Discharge Date:                  Expected Discharge Plan:  [place]  In-House Referral:     Discharge planning Services  CM Consult  Post Acute Care Choice:    Choice offered to:  Patient  DME Arranged:    DME Agency:     HH Arranged:  RN, PT, OT, Nurse's Aide [place] Agency:  [place]  Status of Service:   Complete, will sign off  Medicare Important Message Given:  Yes Date Medicare IM Given:  06/04/14 Medicare IM give by:  Elenor Quinones Date Additional Medicare IM Given:    Additional Medicare Important Message give by:     If discussed at [place] of Stay Meetings, dates discussed:     Additional Comments: 06/04/14 Elenor Quinones, RN, BSN [phone].  CM provided choice for home health services, pt chose [facility].  [facility] contacted, referral was accepted.  Pt has discharge orders signed, address and phone number verified in Epic, [facility] is aware.  Pt stated neighbor (POA) will provide 24 hour supervision.  Pt informed CM that he already has following equipment at home:  Walker , cane , 3:1, and shower chair, pt denies needing any additional equipment.  No additional CM needs.  Maryclare Labrador, RN 06/04/2014, 11:03 AM

## 2014-06-04 NOTE — Discharge Summary (Signed)
Physician Discharge Summary  Verneda Skill. WPV:[id number] DOB: [birth year] DOA: 06/01/2014  PCP: [name], PA-C  Admit date: 06/01/2014 Discharge date: 06/04/2014  Recommendations for Outpatient Follow-up:  1. No new changes in medications.  2. Since no evidence of pneumonia on CXR and no fevers we stopped abx prior to discharge.  3. Hold lasix until seen by PCP who will recheck kidney function and instruct you to take lasix if renal function stable.   Discharge Diagnoses:  Principal Problem:   SOB (shortness of breath) Active Problems:   Peripheral arterial disease   Hyperlipidemia   CAD (coronary artery disease)   HTN (hypertension)   COPD exacerbation   Fecal occult blood test positive   Chest pain   Abdominal pain   AKI (acute kidney injury)   BPH (benign prostatic hyperplasia)    Discharge Condition: stable   Diet recommendation: as tolerated   History of present illness:  81 -year-old male with past medical history of depression, hypertension, dyslipidemia, coronary artery disease, dementia, COPD, BPH who presented to [facility] with reports of worsening shortness of breath, chest pain for past couple of days prior to this admission. Patient was seen in emergency room on 05/30/2014 for similar problem at which time chest x-ray obtained showed small bilateral pleural effusion and minimal right midlung opacity. He was discharged home with oral Levaquin, prednisone and nebulizer treatments. Patient reported no significant symptomatic relief while at home. His shortness of breath progressed.  On admission patient was hemodynamically stable. His Sasaki blood cell count was notable for leukocytosis of 15.1, creatinine of 1.5, lactate 1.91. The troponin level was within normal limits. BNP was 835. Because pt also complaints of abdominal pain abdominal x ray was done but showed no acute intra-abdominal findings.   Patient was admitted for management of acute COPD  exacerbation.  Hospital Course:   Assessment/Plan:    Acute COPD exacerbation / Possible lobar pneumonia / Leukocytosis  - Patient presented with reports of shortness of breath on the admission. - X-ray studies did not reveal acute cardiopulmonary process. - He was started on Solu-Medrol, nebulizer treatment as needed. Patient was also started on Levaquin for possible pneumonia. - Blood cultures, Legionella and strep pneumonia are all negative to date. - Will stop prednisone and Levaquin prior to discharge.  Chest pain - Likely related to acute COPD exacerbation. - Troponin level was within normal limits. - No acute ischemic findings on 12-lead EKG  Abdominal pain and positive FOBT - No acute intra-abdominal findings seen on x-ray studies. - Plavix held because of drop in hemoglobin from 8.7-7.9. Hemoglobin this morning is 8.5. - Will resume plavix on discharge  Acute renal failure  - Likely from Lasix which was placed on hold. - Creatinine improved - Renal ultrasound showed cortical thinning but no evidence of obstruction. - Lasix on hold until pt seen by PCP who can recheck kidney function and instruct the pt to continue lasix if renal function ok.   Essential hypertension - Continue Norvasc and metoprolol  Dyslipidemia - Continue Lipitor 40 mg daily  BPH - Continue Flomax  Depression - Continue Seroquel   DVT Prophylaxis  - SCD's bilaterally in hospital    Code Status: Full.  Family Communication: plan of care discussed with the patient Disposition Plan: Home likely in next 1-2 days if patient feels better.   IV access:  Peripheral IV  Procedures and diagnostic studies:   Dg Chest 2 View 05/30/2014 Small bilateral pleural effusions noted; minimal  right mid lung opacity may reflect atelectasis or possibly mild pneumonia.   US Renal 06/02/2014 1. Increased renal cortical echotexture persists consistent with medical renal disease. There is no  parenchymal mass. 2. There is no hydronephrosis. The urinary bladder remains mildly distended but bilateral ureteral jets are visible bilaterally. 3. There is mild enlargement of the prostate gland.   Dg Abd Acute W/chest 06/01/2014 No evidence of acute cardiopulmonary disease. No evidence of small bowel obstruction or free air.    Medical Consultants:  None   Other Consultants:  Physical therapy  IAnti-Infectives:   Levaquin 06/02/2014 --> 06/04/2014    Signed:  Leisa Lenz, MD  [facility] 06/04/2014, 10:24 AM  Pager #: [phone]  Time spent in minutes: less than 30 minutes   Discharge Exam: Filed Vitals:   06/04/14 0630  BP: 152/64  Pulse: 56  Temp: 98.2 F (36.8 C)  Resp: 18   Filed Vitals:   06/02/14 2237 06/03/14 0349 06/03/14 2212 06/04/14 0630  BP: 174/78 184/72 150/62 152/64  Pulse: 69 71 65 56  Temp:  98.2 F (36.8 C) 98.4 F (36.9 C) 98.2 F (36.8 C)  TempSrc:  Oral Oral Oral  Resp:  20 20 18   Height:      Weight:  57.8 kg (127 lb 6.8 oz)  58.2 kg (128 lb 4.9 oz)  SpO2:  94% 100% 100%    General: Pt is not in acute distress Cardiovascular: Regular rate and rhythm, S1/S2 +, no murmurs Respiratory: Clear to auscultation bilaterally, no wheezing, no crackles, no rhonchi Abdominal: Soft, non tender, non distended, bowel sounds +, no guarding Extremities: no cyanosis, pulses palpable bilaterally DP and PT Neuro: Grossly nonfocal  Discharge Instructions  Discharge Instructions    Call MD for:  difficulty breathing, headache or visual disturbances    Complete by:  As directed      Call MD for:  persistant nausea and vomiting    Complete by:  As directed      Call MD for:  severe uncontrolled pain    Complete by:  As directed      Diet - low sodium heart healthy    Complete by:  As directed      Increase activity slowly    Complete by:  As directed             Medication List    STOP taking these medications         furosemide 20 MG tablet  Commonly known as:  LASIX     levofloxacin 750 MG tablet  Commonly known as:  LEVAQUIN     predniSONE 20 MG tablet  Commonly known as:  DELTASONE      TAKE these medications        amLODipine 10 MG tablet  Commonly known as:  NORVASC  Take 10 mg by mouth daily.     atorvastatin 40 MG tablet  Commonly known as:  LIPITOR  Take 40 mg by mouth daily.     budesonide-formoterol 160-4.5 MCG/ACT inhaler  Commonly known as:  SYMBICORT  Inhale 2 puffs into the lungs 2 (two) times daily.     clopidogrel 75 MG tablet  Commonly known as:  PLAVIX  Take 75 mg by mouth daily.     donepezil 5 MG tablet  Commonly known as:  ARICEPT  Take 5 mg by mouth at bedtime.     haloperidol 5 MG tablet  Commonly known as:  HALDOL  Take 5-10 mg by  mouth every 6 (six) hours as needed for agitation.     metoprolol tartrate 25 MG tablet  Commonly known as:  LOPRESSOR  Take 37.5 mg by mouth 2 (two) times daily.     nitroGLYCERIN 0.4 MG SL tablet  Commonly known as:  NITROSTAT  Place 0.4 mg under the tongue every 5 (five) minutes as needed for chest pain.     PROAIR HFA 108 (90 BASE) MCG/ACT inhaler  Generic drug:  albuterol  Inhale 2 puffs into the lungs every 6 (six) hours as needed.     albuterol (2.5 MG/3ML) 0.083% nebulizer solution  Commonly known as:  PROVENTIL  Take 3 mLs (2.5 mg total) by nebulization every 4 (four) hours as needed for wheezing or shortness of breath.     QUEtiapine Fumarate 150 MG 24 hr tablet  Commonly known as:  SEROQUEL XR  Take 150 mg by mouth at bedtime.     QUEtiapine 50 MG tablet  Commonly known as:  SEROQUEL  Take 50 mg by mouth every morning.     SEROQUEL XR 200 MG 24 hr tablet  Generic drug:  QUEtiapine  Take 200 mg by mouth daily.     tamsulosin 0.4 MG Caps capsule  Commonly known as:  FLOMAX  Take 0.4 mg by mouth daily after supper.           Follow-up Information    Follow up with [name], PA-C. Schedule an  appointment as soon as possible for a visit in 1 week.   Specialty:  Cardiology   Why:  Follow up appt after recent hospitalization   Contact information:   [facility]  [address] [place] [address] [phone]        The results of significant diagnostics from this hospitalization (including imaging, microbiology, ancillary and laboratory) are listed below for reference.    Significant Diagnostic Studies: Dg Chest 2 View  05/30/2014   CLINICAL DATA:  Acute onset of shortness of breath. Initial encounter.  EXAM: CHEST  2 VIEW  COMPARISON:  Chest radiograph performed 04/04/2014  FINDINGS: The lungs are well-aerated. Small bilateral pleural effusions are seen. Minimal right midlung opacity may reflect atelectasis or possibly mild pneumonia. No pneumothorax is seen.  The heart is normal in size; the mediastinal contour is within normal limits. No acute osseous abnormalities are seen.  IMPRESSION: Small bilateral pleural effusions noted; minimal right mid lung opacity may reflect atelectasis or possibly mild pneumonia.   Electronically Signed   By: Garald Balding M.D.   On: 05/30/2014 20:43   US Renal  06/02/2014   CLINICAL DATA:  Acute renal insufficiency  EXAM: RENAL / URINARY TRACT ULTRASOUND COMPLETE  COMPARISON:  renal ultrasound of March 14, 2014  FINDINGS: Right Kidney:  Length: 8.7 cm. The renal cortical echotexture is increased and greater than that of the adjacent liver. There is no hydronephrosis. There is no focal mass.  Left Kidney:  Length: 10.0 cm. The renal cortical echotexture remains increased. There is no hydronephrosis nor focal mass.  Bladder:  The urinary bladder is moderately distended. Bilateral ureteral jets are visible. The prostate gland is enlarged measuring 4 point 8 x 3.6 x 4.9 cm.  IMPRESSION: 1. Increased renal cortical echotexture persists consistent with medical renal disease. There is no parenchymal mass. 2. There is no hydronephrosis. The  urinary bladder remains mildly distended but bilateral ureteral jets are visible bilaterally. 3. There is mild enlargement of the prostate gland.   Electronically Signed  By: David  Martinique M.D.   On: 06/02/2014 07:23   Dg Abd Acute W/chest  06/01/2014   CLINICAL DATA:  Chest/abdominal pain, shortness of breath  EXAM: DG ABDOMEN ACUTE W/ 1V CHEST  COMPARISON:  None.  FINDINGS: Lungs are clear.  No pleural effusion or pneumothorax.  The heart is normal in size.  Nonobstructive bowel gas pattern.  No evidence of free air under the diaphragm on the upright view.  Visualized osseous structures are within normal limits.  IMPRESSION: No evidence of acute cardiopulmonary disease.  No evidence of small bowel obstruction or free air.   Electronically Signed   By: Julian Hy M.D.   On: 06/01/2014 19:42    Microbiology: Recent Results (from the past 240 hour(s))  Culture, blood (routine x 2) Call MD if unable to obtain prior to antibiotics being given     Status: None (Preliminary result)   Collection Time: 06/02/14  4:00 AM  Result Value Ref Range Status   Specimen Description BLOOD LEFT HAND  Final   Special Requests BOTTLES DRAWN AEROBIC AND ANAEROBIC 5CC  Final   Culture   Final           BLOOD CULTURE RECEIVED NO GROWTH TO DATE CULTURE WILL BE HELD FOR 5 DAYS BEFORE ISSUING A FINAL NEGATIVE REPORT Performed at [facility]    Report Status PENDING  Incomplete  Culture, blood (routine x 2) Call MD if unable to obtain prior to antibiotics being given     Status: None (Preliminary result)   Collection Time: 06/02/14  4:05 AM  Result Value Ref Range Status   Specimen Description BLOOD LEFT ARM  Final   Special Requests BOTTLES DRAWN AEROBIC AND ANAEROBIC 5CC EACH   Final   Culture   Final           BLOOD CULTURE RECEIVED NO GROWTH TO DATE CULTURE WILL BE HELD FOR 5 DAYS BEFORE ISSUING A FINAL NEGATIVE REPORT Performed at [facility]    Report Status PENDING  Incomplete      Labs: Basic Metabolic Panel:  Recent Labs Lab 05/30/14 2026 06/01/14 1943 06/03/14 0403  NA 144 144 143  K 4.5 5.0 5.0  CL 100* 103 101  CO2 36* 36* 32  GLUCOSE 101* 132* 102*  BUN 16 29* 30*  CREATININE 1.08 1.50* 1.41*  CALCIUM 8.9 9.2 9.3   Liver Function Tests:  Recent Labs Lab 06/01/14 1943  AST 61*  ALT 54  ALKPHOS 77  BILITOT 0.5  PROT 6.6  ALBUMIN 3.6    Recent Labs Lab 06/01/14 1943  LIPASE 20*   No results for input(s): AMMONIA in the last 168 hours. CBC:  Recent Labs Lab 05/30/14 2026 06/01/14 1943 06/02/14 0405 06/02/14 0636 06/03/14 0403  WBC 7.5 15.1* 11.9* 12.6* 12.0*  NEUTROABS  --  13.6*  --   --   --   HGB 8.7* 8.3* 7.6* 7.9* 8.5*  HCT 27.9* 27.7* 25.2* 25.7* 27.5*  MCV 90.3 90.5 90.0 89.9 88.4  PLT 184 184 143* 151 170   Cardiac Enzymes:  Recent Labs Lab 06/01/14 1943 06/02/14 0129 06/02/14 0636  TROPONINI <0.03 <0.03 <0.03   BNP: BNP (last 3 results)  Recent Labs  05/30/14 2026 06/01/14 1943  BNP 258.2* 835.0*    ProBNP (last 3 results) No results for input(s): PROBNP in the last 8760 hours.  CBG:  Recent Labs Lab 06/02/14 0840 06/03/14 0634 06/04/14 0538  GLUCAP 116* 101* 102*

## 2014-06-05 DIAGNOSIS — N400 Enlarged prostate without lower urinary tract symptoms: Secondary | ICD-10-CM | POA: Diagnosis not present

## 2014-06-05 DIAGNOSIS — J189 Pneumonia, unspecified organism: Secondary | ICD-10-CM | POA: Diagnosis not present

## 2014-06-05 DIAGNOSIS — Z09 Encounter for follow-up examination after completed treatment for conditions other than malignant neoplasm: Secondary | ICD-10-CM | POA: Diagnosis not present

## 2014-06-08 LAB — CULTURE, BLOOD (ROUTINE X 2)
Culture: NO GROWTH
Culture: NO GROWTH

## 2014-06-10 DIAGNOSIS — R069 Unspecified abnormalities of breathing: Secondary | ICD-10-CM | POA: Diagnosis not present

## 2014-06-11 ENCOUNTER — Emergency Department (HOSPITAL_COMMUNITY): Payer: Medicare Other

## 2014-06-11 ENCOUNTER — Encounter (HOSPITAL_COMMUNITY): Payer: Self-pay

## 2014-06-11 ENCOUNTER — Inpatient Hospital Stay (HOSPITAL_COMMUNITY)
Admission: EM | Admit: 2014-06-11 | Discharge: 2014-06-13 | DRG: 191 | Disposition: A | Discharge status: Home Health | Payer: Medicare Other | Attending: Internal Medicine | Admitting: Internal Medicine

## 2014-06-11 DIAGNOSIS — I739 Peripheral vascular disease, unspecified: Secondary | ICD-10-CM | POA: Diagnosis present

## 2014-06-11 DIAGNOSIS — Z66 Do not resuscitate: Secondary | ICD-10-CM | POA: Diagnosis present

## 2014-06-11 DIAGNOSIS — Z87891 Personal history of nicotine dependence: Secondary | ICD-10-CM

## 2014-06-11 DIAGNOSIS — N400 Enlarged prostate without lower urinary tract symptoms: Secondary | ICD-10-CM | POA: Diagnosis present

## 2014-06-11 DIAGNOSIS — N189 Chronic kidney disease, unspecified: Secondary | ICD-10-CM | POA: Diagnosis present

## 2014-06-11 DIAGNOSIS — R0602 Shortness of breath: Secondary | ICD-10-CM | POA: Diagnosis present

## 2014-06-11 DIAGNOSIS — R930 Abnormal findings on diagnostic imaging of skull and head, not elsewhere classified: Secondary | ICD-10-CM | POA: Diagnosis not present

## 2014-06-11 DIAGNOSIS — Z7902 Long term (current) use of antithrombotics/antiplatelets: Secondary | ICD-10-CM

## 2014-06-11 DIAGNOSIS — Z823 Family history of stroke: Secondary | ICD-10-CM | POA: Diagnosis not present

## 2014-06-11 DIAGNOSIS — F0390 Unspecified dementia without behavioral disturbance: Secondary | ICD-10-CM | POA: Diagnosis present

## 2014-06-11 DIAGNOSIS — Z79899 Other long term (current) drug therapy: Secondary | ICD-10-CM

## 2014-06-11 DIAGNOSIS — F329 Major depressive disorder, single episode, unspecified: Secondary | ICD-10-CM | POA: Diagnosis present

## 2014-06-11 DIAGNOSIS — I2510 Atherosclerotic heart disease of native coronary artery without angina pectoris: Secondary | ICD-10-CM | POA: Diagnosis present

## 2014-06-11 DIAGNOSIS — R531 Weakness: Secondary | ICD-10-CM

## 2014-06-11 DIAGNOSIS — J441 Chronic obstructive pulmonary disease with (acute) exacerbation: Secondary | ICD-10-CM | POA: Diagnosis not present

## 2014-06-11 DIAGNOSIS — Z7951 Long term (current) use of inhaled steroids: Secondary | ICD-10-CM

## 2014-06-11 DIAGNOSIS — Z8249 Family history of ischemic heart disease and other diseases of the circulatory system: Secondary | ICD-10-CM

## 2014-06-11 DIAGNOSIS — E785 Hyperlipidemia, unspecified: Secondary | ICD-10-CM | POA: Diagnosis present

## 2014-06-11 DIAGNOSIS — N179 Acute kidney failure, unspecified: Secondary | ICD-10-CM | POA: Diagnosis not present

## 2014-06-11 DIAGNOSIS — I10 Essential (primary) hypertension: Secondary | ICD-10-CM | POA: Diagnosis present

## 2014-06-11 DIAGNOSIS — I129 Hypertensive chronic kidney disease with stage 1 through stage 4 chronic kidney disease, or unspecified chronic kidney disease: Secondary | ICD-10-CM | POA: Diagnosis present

## 2014-06-11 DIAGNOSIS — J439 Emphysema, unspecified: Secondary | ICD-10-CM | POA: Diagnosis not present

## 2014-06-11 DIAGNOSIS — I501 Left ventricular failure: Secondary | ICD-10-CM | POA: Diagnosis not present

## 2014-06-11 LAB — URINALYSIS, ROUTINE W REFLEX MICROSCOPIC
BILIRUBIN URINE: NEGATIVE
GLUCOSE, UA: NEGATIVE mg/dL
HGB URINE DIPSTICK: NEGATIVE
KETONES UR: NEGATIVE mg/dL
Leukocytes, UA: NEGATIVE
Nitrite: NEGATIVE
PH: 6 (ref 5.0–8.0)
Protein, ur: NEGATIVE mg/dL
Specific Gravity, Urine: 1.012 (ref 1.005–1.030)
Urobilinogen, UA: 0.2 mg/dL (ref 0.0–1.0)

## 2014-06-11 LAB — CBC WITH DIFFERENTIAL/PLATELET
BASOS ABS: 0 10*3/uL (ref 0.0–0.1)
BASOS PCT: 0 % (ref 0–1)
EOS ABS: 0.2 10*3/uL (ref 0.0–0.7)
Eosinophils Relative: 1 % (ref 0–5)
HCT: 29.3 % — ABNORMAL LOW (ref 39.0–52.0)
HEMOGLOBIN: 8.7 g/dL — AB (ref 13.0–17.0)
LYMPHS PCT: 9 % — AB (ref 12–46)
Lymphs Abs: 1 10*3/uL (ref 0.7–4.0)
MCH: 28.3 pg (ref 26.0–34.0)
MCHC: 29.7 g/dL — ABNORMAL LOW (ref 30.0–36.0)
MCV: 95.4 fL (ref 78.0–100.0)
MONO ABS: 1.4 10*3/uL — AB (ref 0.1–1.0)
MONOS PCT: 12 % (ref 3–12)
NEUTROS ABS: 8.8 10*3/uL — AB (ref 1.7–7.7)
NEUTROS PCT: 77 % (ref 43–77)
Platelets: 129 10*3/uL — ABNORMAL LOW (ref 150–400)
RBC: 3.07 MIL/uL — AB (ref 4.22–5.81)
RDW: 15 % (ref 11.5–15.5)
WBC: 11.4 10*3/uL — ABNORMAL HIGH (ref 4.0–10.5)

## 2014-06-11 LAB — COMPREHENSIVE METABOLIC PANEL
ALT: 70 U/L — ABNORMAL HIGH (ref 17–63)
ANION GAP: 5 (ref 5–15)
AST: 69 U/L — ABNORMAL HIGH (ref 15–41)
Albumin: 3.7 g/dL (ref 3.5–5.0)
Alkaline Phosphatase: 90 U/L (ref 38–126)
BUN: 25 mg/dL — ABNORMAL HIGH (ref 6–20)
CO2: 40 mmol/L — AB (ref 22–32)
Calcium: 9.1 mg/dL (ref 8.9–10.3)
Chloride: 101 mmol/L (ref 101–111)
Creatinine, Ser: 1.29 mg/dL — ABNORMAL HIGH (ref 0.61–1.24)
GFR calc non Af Amer: 50 mL/min — ABNORMAL LOW (ref >60)
GFR, EST AFRICAN AMERICAN: 58 mL/min — AB (ref >60)
GLUCOSE: 103 mg/dL — AB (ref 65–99)
POTASSIUM: 5 mmol/L (ref 3.5–5.1)
Sodium: 146 mmol/L — ABNORMAL HIGH (ref 135–145)
TOTAL PROTEIN: 6.5 g/dL (ref 6.5–8.1)
Total Bilirubin: 0.5 mg/dL (ref 0.3–1.2)

## 2014-06-11 LAB — I-STAT TROPONIN, ED: Troponin i, poc: 0.01 ng/mL (ref 0.00–0.08)

## 2014-06-11 LAB — BRAIN NATRIURETIC PEPTIDE: B Natriuretic Peptide: 147.7 pg/mL — ABNORMAL HIGH (ref 0.0–100.0)

## 2014-06-11 MED ORDER — HALOPERIDOL 5 MG PO TABS
5.0000 mg | ORAL_TABLET | Freq: Four times a day (QID) | ORAL | Status: DC | PRN
  Filled 2014-06-11: qty 2

## 2014-06-11 MED ORDER — HEPARIN SODIUM (PORCINE) 5000 UNIT/ML IJ SOLN
5000.0000 [IU] | Freq: Three times a day (TID) | INTRAMUSCULAR | Status: DC
  Administered 2014-06-11 – 2014-06-13 (×5): 5000 [IU] via SUBCUTANEOUS
  Filled 2014-06-11 (×10): qty 1

## 2014-06-11 MED ORDER — NITROGLYCERIN 0.4 MG SL SUBL: 0.4000 mg | SUBLINGUAL_TABLET | SUBLINGUAL | Status: DC | PRN

## 2014-06-11 MED ORDER — CLOPIDOGREL BISULFATE 75 MG PO TABS
75.0000 mg | ORAL_TABLET | Freq: Every day | ORAL | Status: DC
  Administered 2014-06-11 – 2014-06-13 (×3): 75 mg via ORAL
  Filled 2014-06-11 (×3): qty 1

## 2014-06-11 MED ORDER — ONDANSETRON HCL 4 MG PO TABS: 4.0000 mg | ORAL_TABLET | Freq: Four times a day (QID) | ORAL | Status: DC | PRN

## 2014-06-11 MED ORDER — AMOXICILLIN-POT CLAVULANATE 875-125 MG PO TABS
1.0000 | ORAL_TABLET | Freq: Two times a day (BID) | ORAL | Status: DC
  Administered 2014-06-11 – 2014-06-13 (×5): 1 via ORAL
  Filled 2014-06-11 (×6): qty 1

## 2014-06-11 MED ORDER — ALUM & MAG HYDROXIDE-SIMETH 200-200-20 MG/5ML PO SUSP
30.0000 mL | Freq: Four times a day (QID) | ORAL | Status: DC | PRN
  Administered 2014-06-11 – 2014-06-12 (×2): 30 mL via ORAL
  Filled 2014-06-11 (×2): qty 30

## 2014-06-11 MED ORDER — TAMSULOSIN HCL 0.4 MG PO CAPS
0.4000 mg | ORAL_CAPSULE | Freq: Every day | ORAL | Status: DC
  Administered 2014-06-11 – 2014-06-13 (×3): 0.4 mg via ORAL
  Filled 2014-06-11 (×3): qty 1

## 2014-06-11 MED ORDER — CHLORHEXIDINE GLUCONATE 0.12 % MT SOLN
15.0000 mL | Freq: Two times a day (BID) | OROMUCOSAL | Status: DC
  Administered 2014-06-11 – 2014-06-13 (×4): 15 mL via OROMUCOSAL
  Filled 2014-06-11 (×5): qty 15

## 2014-06-11 MED ORDER — ONDANSETRON HCL 4 MG/2ML IJ SOLN: 4.0000 mg | Freq: Four times a day (QID) | INTRAMUSCULAR | Status: DC | PRN

## 2014-06-11 MED ORDER — IPRATROPIUM-ALBUTEROL 0.5-2.5 (3) MG/3ML IN SOLN
3.0000 mL | Freq: Three times a day (TID) | RESPIRATORY_TRACT | Status: DC
  Administered 2014-06-12 – 2014-06-13 (×4): 3 mL via RESPIRATORY_TRACT
  Filled 2014-06-11 (×4): qty 3

## 2014-06-11 MED ORDER — ALBUTEROL (5 MG/ML) CONTINUOUS INHALATION SOLN
10.0000 mg/h | INHALATION_SOLUTION | Freq: Once | RESPIRATORY_TRACT | Status: AC
  Administered 2014-06-11: 10 mg/h via RESPIRATORY_TRACT
  Filled 2014-06-11: qty 20

## 2014-06-11 MED ORDER — AMLODIPINE BESYLATE 10 MG PO TABS
10.0000 mg | ORAL_TABLET | Freq: Every day | ORAL | Status: DC
  Administered 2014-06-11 – 2014-06-13 (×3): 10 mg via ORAL
  Filled 2014-06-11 (×3): qty 1

## 2014-06-11 MED ORDER — QUETIAPINE FUMARATE ER 200 MG PO TB24
200.0000 mg | ORAL_TABLET | Freq: Every evening | ORAL | Status: DC
  Administered 2014-06-11 – 2014-06-12 (×2): 200 mg via ORAL
  Filled 2014-06-11 (×3): qty 1

## 2014-06-11 MED ORDER — FUROSEMIDE 20 MG PO TABS
20.0000 mg | ORAL_TABLET | ORAL | Status: DC
  Administered 2014-06-11 – 2014-06-13 (×2): 20 mg via ORAL
  Filled 2014-06-11 (×2): qty 1

## 2014-06-11 MED ORDER — ATORVASTATIN CALCIUM 40 MG PO TABS
40.0000 mg | ORAL_TABLET | Freq: Every day | ORAL | Status: DC
  Administered 2014-06-11 – 2014-06-13 (×3): 40 mg via ORAL
  Filled 2014-06-11 (×3): qty 1

## 2014-06-11 MED ORDER — FUROSEMIDE 20 MG PO TABS: 20.0000 mg | ORAL_TABLET | ORAL | Status: DC

## 2014-06-11 MED ORDER — IPRATROPIUM-ALBUTEROL 0.5-2.5 (3) MG/3ML IN SOLN
3.0000 mL | RESPIRATORY_TRACT | Status: DC
  Administered 2014-06-11 (×3): 3 mL via RESPIRATORY_TRACT
  Filled 2014-06-11 (×3): qty 3

## 2014-06-11 MED ORDER — METHYLPREDNISOLONE SODIUM SUCC 125 MG IJ SOLR
60.0000 mg | Freq: Two times a day (BID) | INTRAMUSCULAR | Status: DC
  Administered 2014-06-11 – 2014-06-12 (×3): 60 mg via INTRAVENOUS
  Filled 2014-06-11 (×2): qty 0.96
  Filled 2014-06-11: qty 2

## 2014-06-11 MED ORDER — IPRATROPIUM BROMIDE 0.02 % IN SOLN
0.5000 mg | Freq: Once | RESPIRATORY_TRACT | Status: AC
  Administered 2014-06-11: 0.5 mg via RESPIRATORY_TRACT
  Filled 2014-06-11: qty 2.5

## 2014-06-11 MED ORDER — DONEPEZIL HCL 5 MG PO TABS
5.0000 mg | ORAL_TABLET | Freq: Every day | ORAL | Status: DC
  Administered 2014-06-11 – 2014-06-12 (×2): 5 mg via ORAL
  Filled 2014-06-11 (×4): qty 1

## 2014-06-11 MED ORDER — CETYLPYRIDINIUM CHLORIDE 0.05 % MT LIQD
7.0000 mL | Freq: Two times a day (BID) | OROMUCOSAL | Status: DC
  Administered 2014-06-11 – 2014-06-12 (×3): 7 mL via OROMUCOSAL

## 2014-06-11 MED ORDER — PREDNISONE 20 MG PO TABS
60.0000 mg | ORAL_TABLET | Freq: Once | ORAL | Status: AC
  Administered 2014-06-11: 60 mg via ORAL
  Filled 2014-06-11: qty 3

## 2014-06-11 MED ORDER — ALBUTEROL SULFATE (2.5 MG/3ML) 0.083% IN NEBU: 2.5000 mg | INHALATION_SOLUTION | RESPIRATORY_TRACT | Status: DC | PRN

## 2014-06-11 MED ORDER — METOPROLOL TARTRATE 25 MG PO TABS
37.5000 mg | ORAL_TABLET | Freq: Two times a day (BID) | ORAL | Status: DC
  Administered 2014-06-11 – 2014-06-13 (×5): 37.5 mg via ORAL
  Filled 2014-06-11 (×5): qty 2

## 2014-06-11 MED ORDER — DM-GUAIFENESIN ER 30-600 MG PO TB12
1.0000 | ORAL_TABLET | Freq: Two times a day (BID) | ORAL | Status: DC
  Administered 2014-06-11 – 2014-06-13 (×5): 1 via ORAL
  Filled 2014-06-11 (×7): qty 1

## 2014-06-11 NOTE — H&P (Addendum)
[facility] History and Physical  Ezequiel Macauley [id number]:[id number] DOB: 04/07/1930 DOA: 06/11/2014  Referring physician: ED physician PCP: [name], PA-C  Specialists:   Chief Complaint: Shortness of breath, cough  HPI: Chase Johnson is a 81 y.o. male with PMH of hypertension, hyperlipidemia, COPD, BPH, chronic kidney disease, PVD, CAD, who presents with cough and shortness of breath.  Patient was recently hospitalized from 5/30 to 06/04/14 because of pneumonia and COPD exacerbation. He was discharged at the stable condition on Levaquin. He completed Levaquin treatment. He has been doing good for several days, but developed worsening shortness of breath today. He has cough, but he cannot cough up any sputum. He reports that he feels tight in his throat. His care giver states patient is not acting like himself and much weaker than his baseline. Of note,  patient was seen by physical therapy and they recommended some home health support and SNF placement if patient continues to deteriorate. Patient was walking much better at that time.   In ED, patient was found to have WBC 11.4, temperature normal, no tachycardia, renal function improved from the time of discharge negative urinalysis, negative troponin, BNP 147.7. Chest x-ray showed emphysematous change, but no infiltration.   Where does patient live?   At home   Can patient participate in ADLs?  None   Review of Systems:   General: no fevers, chills, no changes in body weight, has poor appetite, has fatigue HEENT: no blurry vision, hearing changes or sore throat Pulm: has dyspnea, coughing, no wheezing CV: no chest pain, palpitations Abd: no nausea, vomiting, abdominal pain, diarrhea, constipation GU: no dysuria, burning on urination, increased urinary frequency, hematuria  Ext: no leg edema Neuro: no unilateral weakness, numbness, or tingling, no vision change or hearing loss Skin: no rash MSK: No muscle spasm, no deformity, no  limitation of range of movement in spin Heme: No easy bruising.  Travel history: No recent long distant travel.  Allergy: No Known Allergies  Past Medical History  Diagnosis Date  . CKD (chronic kidney disease)   . CAD (coronary artery disease)   . HTN (hypertension)   . PTSD (post-traumatic stress disorder)   . Tobacco abuse   . Hyperlipidemia   . COPD (chronic obstructive pulmonary disease)   . Peripheral arterial disease   . BPH (benign prostatic hyperplasia)     Past Surgical History  Procedure Laterality Date  . Prostate surgery      for BPH, but not know the detail by pt    Social History:  reports that he quit smoking about 18 months ago. His smoking use included Cigarettes. He does not have any smokeless tobacco history on file. He reports that he does not drink alcohol. His drug history is not on file.  Family History:  Family History  Problem Relation Age of Onset  . Heart attack Father   . Hyperlipidemia Father   . Hypertension Father   . Stroke Father      Prior to Admission medications   Medication Sig Start Date End Date Taking? Authorizing Provider  albuterol (PROVENTIL) (2.5 MG/3ML) 0.083% nebulizer solution Take 3 mLs (2.5 mg total) by nebulization every 4 (four) hours as needed for wheezing or shortness of breath. 06/04/14  Yes Robbie Lis, MD  amLODipine (NORVASC) 10 MG tablet Take 10 mg by mouth daily. 05/20/14  Yes Historical Provider, MD  atorvastatin (LIPITOR) 40 MG tablet Take 40 mg by mouth daily. 05/20/14  Yes Historical Provider, MD  budesonide-formoterol (  SYMBICORT) 160-4.5 MCG/ACT inhaler Inhale 2 puffs into the lungs 2 (two) times daily.   Yes Historical Provider, MD  clopidogrel (PLAVIX) 75 MG tablet Take 75 mg by mouth daily. 05/20/14  Yes Historical Provider, MD  donepezil (ARICEPT) 5 MG tablet Take 5 mg by mouth at bedtime. 05/20/14  Yes Historical Provider, MD  furosemide (LASIX) 20 MG tablet Take 20 mg by mouth See admin instructions. Tues,  Thurs, Sat, Sun   Yes Historical Provider, MD  haloperidol (HALDOL) 5 MG tablet Take 5-10 mg by mouth every 6 (six) hours as needed for agitation.  05/27/14  Yes Historical Provider, MD  metoprolol tartrate (LOPRESSOR) 25 MG tablet Take 37.5 mg by mouth 2 (two) times daily. 05/20/14  Yes Historical Provider, MD  PROAIR HFA 108 (90 BASE) MCG/ACT inhaler Inhale 2 puffs into the lungs every 6 (six) hours as needed. 05/20/14  Yes Historical Provider, MD  QUEtiapine (SEROQUEL) 50 MG tablet Take 50 mg by mouth every morning. 05/20/14  Yes Historical Provider, MD  QUEtiapine Fumarate (SEROQUEL XR) 150 MG 24 hr tablet Take 150 mg by mouth at bedtime.   Yes Historical Provider, MD  SEROQUEL XR 200 MG 24 hr tablet Take 200 mg by mouth every evening.  05/26/14  Yes Historical Provider, MD  tamsulosin (FLOMAX) 0.4 MG CAPS capsule Take 0.4 mg by mouth daily.    Yes Historical Provider, MD  amoxicillin-clavulanate (AUGMENTIN) 875-125 MG per tablet Take 1 tablet by mouth 2 (two) times daily. For 10 days 05/31/14   Historical Provider, MD  nitroGLYCERIN (NITROSTAT) 0.4 MG SL tablet Place 0.4 mg under the tongue every 5 (five) minutes as needed for chest pain.    Historical Provider, MD  predniSONE (DELTASONE) 20 MG tablet Take 3 tablets by mouth daily. For 5 days 05/31/14   Historical Provider, MD    Physical Exam: Filed Vitals:   06/11/14 0600 06/11/14 0606 06/11/14 0630 06/11/14 0700  BP: 153/55 153/55 155/58 167/61  Pulse:  83    Temp:      Resp: 14 16 14 14   Height:      Weight:      SpO2:  98%     General: Not in acute distress HEENT:       Eyes: PERRL, EOMI, no scleral icterus.       ENT: No discharge from the ears and nose, no pharynx injection, no tonsillar enlargement.        Neck: No JVD, no bruit, no mass felt. Heme: No neck lymph node enlargement. Cardiac: S1/S2, RRR, No murmurs, No gallops or rubs. Pulm: decreased air movement bilaterally. No rales, wheezing, rhonchi or rubs. Abd: Soft,  nondistended, nontender, no rebound pain, no organomegaly, BS present. Ext: No pitting leg edema bilaterally. 2+DP/PT pulse bilaterally. Musculoskeletal: No joint deformities, No joint redness or warmth, no limitation of ROM in spin. Skin: No rashes.  Neuro: Alert, oriented X3, cranial nerves II-XII grossly intact, muscle strength 5/5 in all extremities, sensation to light touch intact.  Psych: Patient is not psychotic, no suicidal or hemocidal ideation.  Labs on Admission:  Basic Metabolic Panel:  Recent Labs Lab 06/11/14 0128  NA 146*  K 5.0  CL 101  CO2 40*  GLUCOSE 103*  BUN 25*  CREATININE 1.29*  CALCIUM 9.1   Liver Function Tests:  Recent Labs Lab 06/11/14 0128  AST 69*  ALT 70*  ALKPHOS 90  BILITOT 0.5  PROT 6.5  ALBUMIN 3.7   No results for input(s): LIPASE, AMYLASE  in the last 168 hours. No results for input(s): AMMONIA in the last 168 hours. CBC:  Recent Labs Lab 06/11/14 0128  WBC 11.4*  NEUTROABS 8.8*  HGB 8.7*  HCT 29.3*  MCV 95.4  PLT 129*   Cardiac Enzymes: No results for input(s): CKTOTAL, CKMB, CKMBINDEX, TROPONINI in the last 168 hours.  BNP (last 3 results)  Recent Labs  05/30/14 2026 06/01/14 1943 06/11/14 0128  BNP 258.2* 835.0* 147.7*    ProBNP (last 3 results) No results for input(s): PROBNP in the last 8760 hours.  CBG: No results for input(s): GLUCAP in the last 168 hours.  Radiological Exams on Admission: Ct Head Wo Contrast  06/11/2014   CLINICAL DATA:  Weakness for 1 day  EXAM: CT HEAD WITHOUT CONTRAST  TECHNIQUE: Contiguous axial images were obtained from the base of the skull through the vertex without intravenous contrast.  COMPARISON:  MRI 03/14/2014  FINDINGS: There is no intracranial hemorrhage, mass or evidence of acute infarction. There is Simons matter hypodensity in both cerebral hemispheres, likely due to chronic small vessel ischemic disease. There is mild generalized atrophy. There is no bony abnormality.  There is prior sinonasal surgery on the right with the visible paranasal sinuses being clear.  IMPRESSION: No acute findings. Generalized atrophy and moderate chronic small vessel ischemic disease.   Electronically Signed   By: Andreas Newport M.D.   On: 06/11/2014 06:11   Dg Chest Port 1 View  06/11/2014   CLINICAL DATA:  Dyspnea  EXAM: PORTABLE CHEST - 1 VIEW  COMPARISON:  06/01/2014  FINDINGS: Bullous emphysematous changes are present in the upper lobes. No pneumothorax is evident. No superimposed acute infiltrate or CHF is evident. Heart size is normal, unchanged. Pulmonary vasculature is normal. There is no large effusion.  IMPRESSION: Severe bullous emphysematous changes without evidence of an acute superimposed process.   Electronically Signed   By: Andreas Newport M.D.   On: 06/11/2014 01:32    EKG: Independently reviewed.  Abnormal findings:    No ischemic change..   Assessment/Plan Principal Problem:   COPD exacerbation Active Problems:   Peripheral arterial disease   Hyperlipidemia   CAD (coronary artery disease)   HTN (hypertension)   SOB (shortness of breath)   AKI (acute kidney injury)   BPH (benign prostatic hyperplasia)  SOB: Likely due to mild COPD exacerbation if any. Patient has decreased air movement, but no rhonchi or wheezing. Chest x-ray has no new infiltration.  -will admit patient to telemetry bed   -Nebulizers: scheduled Duoneb and prn albuterol -Solu-Medrol 60 mg twice a day -continue home on Augmentin -Mucinex for cough  -consult to SW for possible SNF placement  AKI: his Cre has improve from 1.41 on discharge to 1.29 today. No hydronephrosis by renal ultrasound on 5/31 -Follow up renal function by BMP  HTN:  -continue lasix -Continue amlodipine, metoprolol -Hydralazine IV when necessary  HLD: -Continue home medications: Lipitor  PAD:  - plavix  BPH: -flomax  Depression: Stable. No suicidal or homicidal ideations. -Continue  Seroquel   DVT ppx: SQ Heparin   Code Status:DNR Family Communication:   Yes, patient's caregiver, power of attorney, Mr. Rinaldo Ratel [phone]   at bed side Disposition Plan: Admit to inpatient   Date of Service 06/11/2014    Ivor Costa [facility] Pager [phone]  If 7PM-7AM, please contact night-coverage [email or web address] Password [facility] 06/11/2014, 8:13 AM

## 2014-06-11 NOTE — ED Provider Notes (Signed)
CSN: [id number]     Arrival date & time 06/11/14  0031 History   First MD Initiated Contact with Patient 06/11/14 0045     Chief Complaint  Patient presents with  . Respiratory Distress     (Consider location/radiation/quality/duration/timing/severity/associated sxs/prior Treatment) Patient is a 81 y.o. male presenting with shortness of breath. The history is provided by the patient.  Shortness of Breath Severity:  Moderate Onset quality:  Sudden Timing:  Constant Progression:  Improving Chronicity:  Chronic Context: not URI   Relieved by:  Nothing Worsened by:  Nothing tried Ineffective treatments:  Inhaler (nebulizers) Associated symptoms: no abdominal pain, no cough, no fever and no vomiting     Past Medical History  Diagnosis Date  . CKD (chronic kidney disease)   . CAD (coronary artery disease)   . HTN (hypertension)   . PTSD (post-traumatic stress disorder)   . Tobacco abuse   . Hyperlipidemia   . COPD (chronic obstructive pulmonary disease)   . Peripheral arterial disease   . BPH (benign prostatic hyperplasia)    Past Surgical History  Procedure Laterality Date  . Prostate surgery      for BPH, but not know the detail by pt   Family History  Problem Relation Age of Onset  . Heart attack Father   . Hyperlipidemia Father   . Hypertension Father   . Stroke Father    History  Substance Use Topics  . Smoking status: Former Smoker    Types: Cigarettes    Quit date: 12/01/2012  . Smokeless tobacco: Not on file  . Alcohol Use: No    Review of Systems  Constitutional: Negative for fever.  Respiratory: Negative for cough and shortness of breath.   Gastrointestinal: Negative for vomiting and abdominal pain.  All other systems reviewed and are negative.     Allergies  Review of patient's allergies indicates no known allergies.  Home Medications   Prior to Admission medications   Medication Sig Start Date End Date Taking? Authorizing Provider   albuterol (PROVENTIL) (2.5 MG/3ML) 0.083% nebulizer solution Take 3 mLs (2.5 mg total) by nebulization every 4 (four) hours as needed for wheezing or shortness of breath. 06/04/14  Yes Robbie Lis, MD  amLODipine (NORVASC) 10 MG tablet Take 10 mg by mouth daily. 05/20/14  Yes Historical Provider, MD  atorvastatin (LIPITOR) 40 MG tablet Take 40 mg by mouth daily. 05/20/14  Yes Historical Provider, MD  budesonide-formoterol (SYMBICORT) 160-4.5 MCG/ACT inhaler Inhale 2 puffs into the lungs 2 (two) times daily.   Yes Historical Provider, MD  clopidogrel (PLAVIX) 75 MG tablet Take 75 mg by mouth daily. 05/20/14  Yes Historical Provider, MD  donepezil (ARICEPT) 5 MG tablet Take 5 mg by mouth at bedtime. 05/20/14  Yes Historical Provider, MD  furosemide (LASIX) 20 MG tablet Take 20 mg by mouth See admin instructions. Tues, Thurs, Sat, Sun   Yes Historical Provider, MD  haloperidol (HALDOL) 5 MG tablet Take 5-10 mg by mouth every 6 (six) hours as needed for agitation.  05/27/14  Yes Historical Provider, MD  metoprolol tartrate (LOPRESSOR) 25 MG tablet Take 37.5 mg by mouth 2 (two) times daily. 05/20/14  Yes Historical Provider, MD  PROAIR HFA 108 (90 BASE) MCG/ACT inhaler Inhale 2 puffs into the lungs every 6 (six) hours as needed. 05/20/14  Yes Historical Provider, MD  QUEtiapine (SEROQUEL) 50 MG tablet Take 50 mg by mouth every morning. 05/20/14  Yes Historical Provider, MD  QUEtiapine Fumarate (SEROQUEL XR) 150  MG 24 hr tablet Take 150 mg by mouth at bedtime.   Yes Historical Provider, MD  SEROQUEL XR 200 MG 24 hr tablet Take 200 mg by mouth every evening.  05/26/14  Yes Historical Provider, MD  tamsulosin (FLOMAX) 0.4 MG CAPS capsule Take 0.4 mg by mouth daily.    Yes Historical Provider, MD  amoxicillin-clavulanate (AUGMENTIN) 875-125 MG per tablet Take 1 tablet by mouth 2 (two) times daily. For 10 days 05/31/14   Historical Provider, MD  nitroGLYCERIN (NITROSTAT) 0.4 MG SL tablet Place 0.4 mg under the tongue  every 5 (five) minutes as needed for chest pain.    Historical Provider, MD  predniSONE (DELTASONE) 20 MG tablet Take 3 tablets by mouth daily. For 5 days 05/31/14   Historical Provider, MD   BP 164/69 mmHg  Pulse 77  Temp(Src) 97.8 F (36.6 C)  Resp 14  Ht 6\' 1"  (1.854 m)  Wt 128 lb (58.06 kg)  BMI 16.89 kg/m2  SpO2 98% Physical Exam  Constitutional: He is oriented to person, place, and time. He appears well-developed and well-nourished. No distress.  HENT:  Head: Normocephalic and atraumatic.  Mouth/Throat: Oropharynx is clear and moist. No oropharyngeal exudate.  Eyes: EOM are normal. Pupils are equal, round, and reactive to light.  Neck: Normal range of motion. Neck supple.  Cardiovascular: Normal rate and regular rhythm.  Exam reveals no friction rub.   No murmur heard. Pulmonary/Chest: No respiratory distress. He has wheezes (moderate, diffuse). He has no rales. He exhibits no tenderness.  Abdominal: He exhibits no distension. There is no tenderness. There is no rebound.  Musculoskeletal: Normal range of motion. He exhibits no edema.  Neurological: He is alert and oriented to person, place, and time.  Skin: He is not diaphoretic.    ED Course  Procedures (including critical care time) Labs Review Labs Reviewed  CBC WITH DIFFERENTIAL/PLATELET - Abnormal; Notable for the following:    WBC 11.4 (*)    RBC 3.07 (*)    Hemoglobin 8.7 (*)    HCT 29.3 (*)    MCHC 29.7 (*)    Platelets 129 (*)    Neutro Abs 8.8 (*)    Lymphocytes Relative 9 (*)    Monocytes Absolute 1.4 (*)    All other components within normal limits  COMPREHENSIVE METABOLIC PANEL - Abnormal; Notable for the following:    Sodium 146 (*)    CO2 40 (*)    Glucose, Bld 103 (*)    BUN 25 (*)    Creatinine, Ser 1.29 (*)    AST 69 (*)    ALT 70 (*)    GFR calc non Af Amer 50 (*)    GFR calc Af Amer 58 (*)    All other components within normal limits  BRAIN NATRIURETIC PEPTIDE - Abnormal; Notable for the  following:    B Natriuretic Peptide 147.7 (*)    All other components within normal limits  I-STAT TROPOININ, ED    Imaging Review Dg Chest Port 1 View  06/11/2014   CLINICAL DATA:  Dyspnea  EXAM: PORTABLE CHEST - 1 VIEW  COMPARISON:  06/01/2014  FINDINGS: Bullous emphysematous changes are present in the upper lobes. No pneumothorax is evident. No superimposed acute infiltrate or CHF is evident. Heart size is normal, unchanged. Pulmonary vasculature is normal. There is no large effusion.  IMPRESSION: Severe bullous emphysematous changes without evidence of an acute superimposed process.   Electronically Signed   By: Valerie Roys.D.  On: 06/11/2014 01:32     EKG Interpretation   Date/Time:  Thursday June 11 2014 00:34:58 EDT Ventricular Rate:  69 PR Interval:  116 QRS Duration: 79 QT Interval:  411 QTC Calculation: 440 R Axis:   84 Text Interpretation:  Sinus rhythm Borderline short PR interval Borderline  right axis deviation Borderline T abnormalities, lateral leads Minimal ST  elevation, anterior leads No significant change since last tracing  Confirmed by Mingo Amber  MD, [address] ([id number]) on 06/11/2014 12:46:19 AM      MDM   Final diagnoses:  COPD exacerbation  Weakness    81 year old male with COPD and recent admission for COPD exacerbation presents with shortness of breath. Began today with shortness of breath. He has some mild dementia and has multiple caregivers at daycare his home. Here he states he's feeling okay, but his caregiver say he was looking much worse earlier. He is given an hour-long continuous albuterol for some moderate wheezing on exam. Chest x-ray shows hyperexpanded lungs without acute process. Labs are normal, BNP is much better was previously 800 on the hospital now is 124. His bicarbonate is elevated, but this is likely compensating for his COPD. He is well appearing and states he feels good on reexam.  Patient looks well reexam. Spoke with his caregiver  who states patient is not acting like himself and much weaker and more confused. He is having much more difficulty ambulating. Of note patient was seen by physical therapy one of the hospital and they recommended some home health support and sniff placement if patient continues to deteriorate. Patient was walking much better at that time. He is listed is going to in her feet but he was staggering from left to right.  Will obtain Head CT and plan for admission for weakness.  Evelina Bucy, MD 06/11/14 [phone]

## 2014-06-11 NOTE — ED Notes (Signed)
Pt given breakfast tray

## 2014-06-11 NOTE — Clinical Social Work Note (Signed)
Clinical Social Work Assessment  Patient Details  Name: Chase Johnson MRN: [id number] Date of Birth: [birth year]  Date of referral:  06/11/14               Reason for consult:  Discharge Planning                Permission sought to share information with:  Other (Neighbor/POA ) Permission granted to share information::  Yes, Verbal Permission Granted  Name::     [name] [name]-[phone].  Agency::  none  Relationship::  neighbor/poa   Contact Information:  [phone]  Housing/Transportation Living arrangements for the past 2 months:  [place] of Information:  Patient Patient Interpreter Needed:  None Criminal Activity/Legal Involvement Pertinent to Current Situation/Hospitalization:  No - Comment as needed Significant Relationships:  Warehouse manager Lives with:  Self Do you feel safe going back to the place where you live?  Yes Need for family participation in patient care:  No (Coment)  Care giving concerns:  CSW met with pt at bedside to complete psychosocial due to patient having increased weakness and may benefit from increased services upon discharge for short term rehab if physically patient has indeed deteriorated physically since admission on 06/01/3014    Social Worker assessment / plan:  CSW met with pt at bedside and complete psychosocial assessment. Pt shared that he has assistance all day and his neighbor his his POA. Patient shares that he wishes to return home. "My home care is superb. Its my fault because I haven't worn my oxygen like I should. I learned a hard lesson this time to always wear my oxygen." Patient and CSW discussed  Resources available if needed. Patient thanked csw however was adamant about returning home and was not interested in short term rehab at skilled nursing. Pt response, "I'll get worse if I have to go there, I'll keep doing it at home."  Employment status:  Retired Forensic scientist:  Medicare PT Recommendations:  Not  assessed at this time Information / Referral to community resources:   (none identified at this time )  Patient/Family's Response to care:  Patient was very defensive of looking into other resources available and very deteremined to return home. Pt was also frustrated with himsel for not wearing his oxygen while eating and stated, "I've learned a hard lesson to not take this oxygen off for a second."  Patient/Family's Understanding of and Emotional Response to Diagnosis, Current Treatment, and Prognosis:  Pt verbalized understanding of COPD exacerbation, and need for hosptialization. Pt verbalized understanding that other resources for higher level of care are available if needed. Patient however stated tha the would prefer to return home no matter what with his current home care.  RN CM to be consulted for home health services.   Emotional Assessment Appearance:  Appears stated age Attitude/Demeanor/Rapport:   (calm and cooperative ) Affect (typically observed):  Accepting, Calm, Defensive Orientation:  Oriented to Self, Oriented to Place, Oriented to  Time, Oriented to Situation Alcohol / Substance use:  Not Applicable Psych involvement (Current and /or in the community):  No (Comment)  Discharge Needs  Concerns to be addressed:  Patient refuses services Readmission within the last 30 days:  Yes Current discharge risk:  None Barriers to Discharge:  No Barriers Identified   Westin Knotts, [place], LCSW 06/11/2014, 8:10 AM

## 2014-06-11 NOTE — Progress Notes (Signed)
81 year old male with h/o hypertension, admitted for COPD exacerbation. He was started on steroids and bronchodilators as needed.   Hosie Poisson , MD [phone]

## 2014-06-11 NOTE — ED Notes (Signed)
Patient arrives by EMS with complaints weakness all day and difficulty breathing since 2200 last night.  Patient lives by himself and his neighbor is his POA.  POA password is Production manager, and contact is [name] [name]-[phone].

## 2014-06-11 NOTE — ED Notes (Signed)
Bed: WA16 Expected date:  Expected time:  Means of arrival:  Comments: EMS/resp. distress 

## 2014-06-11 NOTE — Evaluation (Addendum)
Physical Therapy Evaluation Patient Details Name: Chase Johnson MRN: [id number] DOB: [birth year] Today's Date: 06/11/2014   History of Present Illness  Patient is 81 yo whopresents to the ER 06/11/14 for evaluation of shortness of breath. Patient reports that he has been experiencing intermittent sharp pains in the center of his chest and pain in the upper abdomen.  recently in hospital-1 week ago,  Clinical Impression  Patient ambulated on 2 liters, no AD. Patient will benefit from PT to address problems listed in note below.    Follow Up Recommendations Home health PT;Supervision/Assistance - 24 hour    Equipment Recommendations  None recommended by PT    Recommendations for Other Services OT consult     Precautions / Restrictions Precautions Precautions: Fall Restrictions Weight Bearing Restrictions: No  Did not monitor sats as patient has urgency for bathroom.     Mobility  Bed Mobility Overal bed mobility: Independent                Transfers Overall transfer level: Needs assistance Equipment used: 1 person hand held assist Transfers: Sit to/from Stand Sit to Stand: Supervision Stand pivot transfers: Supervision       General transfer comment: Close guard for safety as pt powers-up to full standing position.patient did not want PT to hold onto him.   Ambulation/Gait Ambulation/Gait assistance: Min guard Ambulation Distance (Feet): 300 Feet Assistive device: None   Gait velocity: Decreased`   General Gait Details: no balance loss, turns and backs up without assist.  Stairs            Wheelchair Mobility    Modified Rankin (Stroke Patients Only)       Balance Overall balance assessment: Needs assistance Sitting-balance support: Feet supported;No upper extremity supported Sitting balance-Leahy Scale: Good     Standing balance support: During functional activity;No upper extremity supported Standing balance-Leahy Scale: Fair                                Pertinent Vitals/Pain Pain Assessment: No/denies pain    Home Living Family/patient expects to be discharged to:: Private residence Living Arrangements: Alone;Non-relatives/Friends Available Help at Discharge: Other (Comment);Friend(s) Type of Home: House   Entrance Stairs-Rails: Right;Left Entrance Stairs-Number of Steps: 3   Home Equipment: Walker - 2 wheels;Bedside commode;Shower seat - built in      Prior Function Level of Independence: Needs assistance   Gait / Transfers Assistance Needed: independent  ADL's / Homemaking Assistance Needed: States he performs his ADL; friend cooks/cleans and takes him to appts        Hand Dominance        Extremity/Trunk Assessment   Upper Extremity Assessment: Generalized weakness           Lower Extremity Assessment: Generalized weakness         Communication   Communication: No difficulties  Cognition Arousal/Alertness: Awake/alert Behavior During Therapy: Flat affect;Agitated Overall Cognitive Status: No family/caregiver present to determine baseline cognitive functioning                      General Comments      Exercises        Assessment/Plan    PT Assessment Patient needs continued PT services  PT Diagnosis Difficulty walking;Generalized weakness   PT Problem List Decreased strength;Decreased range of motion;Decreased activity tolerance;Decreased balance;Decreased mobility;Decreased knowledge of use of DME;Decreased safety awareness;Decreased knowledge of precautions;Cardiopulmonary status limiting activity  PT Treatment Interventions DME instruction;Gait training;Stair training;Functional mobility training;Therapeutic activities;Therapeutic exercise;Neuromuscular re-education;Patient/family education   PT Goals (Current goals can be found in the Care Plan section) Acute Rehab PT Goals Patient Stated Goal: to go home PT Goal Formulation: With patient Time For Goal  Achievement: 06/25/14 Potential to Achieve Goals: Good    Frequency Min 3X/week   Barriers to discharge Decreased caregiver support uncertain of caregivers at night    Co-evaluation               End of Session Equipment Utilized During Treatment: Oxygen Activity Tolerance: Patient tolerated treatment well Patient left: with call bell/phone within reach;with bed alarm set Nurse Communication: Mobility status         Time: 7989-2119 PT Time Calculation (min) (ACUTE ONLY): 19 min   Charges:   PT Evaluation $Initial PT Evaluation Tier I: 1 Procedure     PT G CodesClaretha Cooper 06/11/2014, 4:03 PM  Tresa Endo PT [phone]

## 2014-06-12 DIAGNOSIS — I10 Essential (primary) hypertension: Secondary | ICD-10-CM

## 2014-06-12 DIAGNOSIS — N179 Acute kidney failure, unspecified: Secondary | ICD-10-CM

## 2014-06-12 LAB — COMPREHENSIVE METABOLIC PANEL
ALT: 70 U/L — ABNORMAL HIGH (ref 17–63)
AST: 39 U/L (ref 15–41)
Albumin: 3.6 g/dL (ref 3.5–5.0)
Alkaline Phosphatase: 88 U/L (ref 38–126)
Anion gap: 9 (ref 5–15)
BILIRUBIN TOTAL: 0.7 mg/dL (ref 0.3–1.2)
BUN: 35 mg/dL — AB (ref 6–20)
CHLORIDE: 96 mmol/L — AB (ref 101–111)
CO2: 38 mmol/L — ABNORMAL HIGH (ref 22–32)
Calcium: 9.2 mg/dL (ref 8.9–10.3)
Creatinine, Ser: 1.24 mg/dL (ref 0.61–1.24)
GFR, EST NON AFRICAN AMERICAN: 52 mL/min — AB (ref >60)
Glucose, Bld: 129 mg/dL — ABNORMAL HIGH (ref 65–99)
Potassium: 4.7 mmol/L (ref 3.5–5.1)
Sodium: 143 mmol/L (ref 135–145)
TOTAL PROTEIN: 6.2 g/dL — AB (ref 6.5–8.1)

## 2014-06-12 LAB — CBC
HEMATOCRIT: 26.7 % — AB (ref 39.0–52.0)
Hemoglobin: 8.3 g/dL — ABNORMAL LOW (ref 13.0–17.0)
MCH: 27.9 pg (ref 26.0–34.0)
MCHC: 31.1 g/dL (ref 30.0–36.0)
MCV: 89.9 fL (ref 78.0–100.0)
Platelets: 125 10*3/uL — ABNORMAL LOW (ref 150–400)
RBC: 2.97 MIL/uL — ABNORMAL LOW (ref 4.22–5.81)
RDW: 14.8 % (ref 11.5–15.5)
WBC: 18 10*3/uL — AB (ref 4.0–10.5)

## 2014-06-12 MED ORDER — PREDNISONE 50 MG PO TABS
60.0000 mg | ORAL_TABLET | Freq: Every day | ORAL | Status: DC
  Administered 2014-06-13: 60 mg via ORAL
  Filled 2014-06-12: qty 1

## 2014-06-12 MED ORDER — ACETAMINOPHEN 325 MG PO TABS
650.0000 mg | ORAL_TABLET | ORAL | Status: DC | PRN
  Administered 2014-06-12: 650 mg via ORAL
  Filled 2014-06-12: qty 2

## 2014-06-12 MED ORDER — IBUPROFEN 400 MG PO TABS
400.0000 mg | ORAL_TABLET | Freq: Once | ORAL | Status: AC
  Administered 2014-06-12: 400 mg via ORAL
  Filled 2014-06-12: qty 1

## 2014-06-12 NOTE — Progress Notes (Signed)
Occupational Therapy Evaluation Patient Details Name: Chase Johnson MRN: [id number] DOB: [birth year] Today's Date: 06/12/2014    History of Present Illness Patient is 81 yo whopresents to the ER 06/11/14 for evaluation of shortness of breath. Patient reports that he has been experiencing intermittent sharp pains in the center of his chest and pain in the upper abdomen.  recently in hospital-1 week ago,   Clinical Impression   Patient presenting with decreased activity tolerance/endurance and decreased safety awareness and decreased overall awareness into deficits. Patient supervision PTA. Patient currently functioning at an overall min guard assist level. Patient will benefit from acute OT to increase overall independence in the areas of ADLs, functional mobility, and overall safety in order to safely discharge home with [facility] and 24/7 supervision.     Follow Up Recommendations  Home health OT;Supervision/Assistance - 24 hour    Equipment Recommendations  None recommended by OT    Recommendations for Other Services  None at this time  Precautions / Restrictions Precautions Precautions: Fall Restrictions Weight Bearing Restrictions: No      Mobility Bed Mobility Overal bed mobility: Modified Independent Bed Mobility: Supine to Sit General bed mobility comments: HOB elevated, encouragement needed to get OOB  Transfers Overall transfer level: Needs assistance Equipment used: 1 person hand held assist Transfers: Sit to/from Stand Sit to Stand: Min guard General transfer comment: Min guard to steady and for safety.     Balance Overall balance assessment: Needs assistance Sitting-balance support: No upper extremity supported;Feet supported Sitting balance-Leahy Scale: Good     Standing balance support: No upper extremity supported;During functional activity Standing balance-Leahy Scale: Fair    ADL Overall ADL's : Needs assistance/impaired Eating/Feeding: Set up;Sitting    Grooming: Min guard;Standing   Upper Body Bathing: Min guard;Standing   Lower Body Bathing: Min guard;Sit to/from stand   Upper Body Dressing : Min guard;Standing   Lower Body Dressing: Min guard;Sit to/from stand   Toilet Transfer: Min guard;BSC;Ambulation Functional mobility during ADLs: Min guard General ADL Comments: Pt overall min guard for ADLs and functional mobility. Pt reached down to feet for donning of socks and shoes. 02 sats =88% after reaching down. Encouraged pursed lip breathing and sats quickly increased to 91%. Educated pt that bending over constricted lungs causing it harder to breathe and get oxygen into lungs. Pt with decreased safety awareness and decreased overall awareness into deficits. Pt will benefit from [facility] secodnary to pt currently lives alone. Pt states he has friends and neighbors who provide supervision for most of the day and he is only left alone ~2-3 hours per day.     Pertinent Vitals/Pain Pain Assessment: No/denies pain     Hand Dominance Right   Extremity/Trunk Assessment Upper Extremity Assessment Upper Extremity Assessment: Generalized weakness   Lower Extremity Assessment Lower Extremity Assessment: Generalized weakness   Cervical / Trunk Assessment Cervical / Trunk Assessment: Kyphotic   Communication Communication Communication: No difficulties   Cognition Arousal/Alertness: Awake/alert Behavior During Therapy: Flat affect;Agitated Overall Cognitive Status: No family/caregiver present to determine baseline cognitive functioning (Pt seems to be poor historian, answers do not match up from previously entered data)              [place] expects to be discharged to:: Private residence Living Arrangements: Alone;Non-relatives/Friends Available Help at Discharge: Other (Comment);Friend(s) (pt reports he is only left alone ~2-3 hours per day) Type of Home: House Home Access: Stairs to enter [employer]  of Steps: 3 Entrance Stairs-Rails: Right;Left Home  Layout: One level     Bathroom Shower/Tub: Tub only   Bathroom Toilet: Handicapped height Bathroom Accessibility: Yes How Accessible: Accessible via walker Home Equipment: [place] - 2 wheels;Bedside commode;Tub bench   Additional Comments: Pt reports he has to take baths secondary to showers make him "itch"; he states he sits on a bench      Prior Functioning/Environment Level of Independence: Needs assistance  Gait / Transfers Assistance Needed: independent ADL's / Homemaking Assistance Needed: States he performs his ADL; friend cooks/cleans and takes him to appts    OT Diagnosis: Generalized weakness;Altered mental status   OT Problem List: Decreased strength;Decreased activity tolerance;Impaired balance (sitting and/or standing);Decreased safety awareness;Cardiopulmonary status limiting activity   OT Treatment/Interventions: Self-care/ADL training;Therapeutic exercise;Energy conservation;DME and/or AE instruction;Therapeutic activities;Patient/family education;Balance training    OT Goals(Current goals can be found in the care plan section) Acute Rehab OT Goals Patient Stated Goal: go home OT Goal Formulation: With patient Time For Goal Achievement: 06/19/14 Potential to Achieve Goals: Good ADL Goals Pt Will Perform Grooming: with supervision;standing Pt Will Perform Upper Body Bathing: with set-up;sitting Pt Will Perform Lower Body Bathing: with supervision;sit to/from stand Pt Will Perform Upper Body Dressing: with set-up;sitting Pt Will Perform Lower Body Dressing: with supervision;sit to/from stand Pt Will Transfer to Toilet: with supervision;ambulating;bedside commode Pt Will Perform Toileting - Clothing Manipulation and hygiene: with supervision;sit to/from stand Pt Will Perform Tub/Shower Transfer: Tub transfer;tub bench;with supervision Pt/caregiver will Perform Home Exercise Program: Increased strength;With written  HEP provided;With Supervision;Both right and left upper extremity  OT Frequency: Min 2X/week   Barriers to D/C: Other (comment) (Pt reports lots of support, unsure as pt is a poor historian)   End of Session Nurse Communication: Other (comment) (Pt's increased agitation)  Activity Tolerance: Treatment limited secondary to agitation Patient left: in chair;with call bell/phone within reach;with chair alarm set   Time: [phone] OT Time Calculation (min): 25 min Charges:  OT General Charges $OT Visit: 1 Procedure OT Evaluation $Initial OT Evaluation Tier I: 1 Procedure OT Treatments $Self Care/Home Management : 8-22 mins  Sol Odor , MS, OTR/L, CLT Pager: [phone]  06/12/2014, 9:36 AM

## 2014-06-12 NOTE — Progress Notes (Signed)
PT Cancellation Note  Patient Details Name: Chase Johnson MRN: [id number] DOB: [birth year]   Cancelled Treatment:    Reason Eval/Treat Not Completed: Fatigue/lethargy limiting ability to participate;Medical issues which prohibited therapy. Pt states he feels "tired and dizzy", refused to participate in PT despite encouragement.   Jacole Capley 06/12/2014, 10:29 AM

## 2014-06-12 NOTE — Care Management Note (Signed)
Case Management Note  Patient Details  Name: Chase Johnson MRN: [id number] Date of Birth: 1930/04/05  Subjective/Objective: 81 y/o m admitted w/COPD.Patient states he has 24hrs caregivers.He pleasantly declines any home health care.Has home 02 [employer].No further d/c needs.                   Action/Plan:d/c home.No anticipated d/c needs.   Expected Discharge Date:   [place])               Expected Discharge Plan:  Home/Self Care  In-House Referral:     Discharge planning Services  CM Consult  Post Acute Care Choice:   (Has 24hrs caregivers) Choice offered to:     DME Arranged:    DME Agency:   [facility]. Already provides home 02.Patient has travel tank.)  HH Arranged:    [place] Agency:     Status of Service:  In process, will continue to follow  Medicare Important Message Given:    Date Medicare IM Given:    Medicare IM give by:    Date Additional Medicare IM Given:    Additional Medicare Important Message give by:     If discussed at [place] of Stay Meetings, dates discussed:    Additional Comments:  Dessa Phi, RN 06/12/2014, 3:43 PM

## 2014-06-12 NOTE — Progress Notes (Signed)
[facility] PROGRESS NOTE  Chase Johnson ALP:[id number] DOB: 03/27/1930 DOA: 06/11/2014 PCP: Chase Cowman, PA-C  Assessment/Plan: 1. Acute copd exacerbation:  No wheezing today, plan to transition to po steroids in am.  Bronchodilators, augmentin and mucinex.  PT eval.   Acute renal failure: Slight improvement.  No hydronephrosis.  Recheck bmp in am.    Hypertension controlled.    Hyperlipidemia: Lipitor.   PAD: plavix  BPH on flomax  Depression:  no suicidal or homicidal ideations, continue seroquel.    Code Status: DNR Family Communication: NONE AT BEDSIDE Disposition Plan: pending PT eval.    Consultants: none Procedures:  none  Antibiotics:  Augmentin.   HPI/Subjective: Feeling better today.   Objective: Filed Vitals:   06/12/14 1300  BP: 145/57  Pulse: 71  Temp: 98.7 F (37.1 C)  Resp: 18    Intake/Output Summary (Last 24 hours) at 06/12/14 1444 Last data filed at 06/12/14 1417  Gross per 24 hour  Intake    720 ml  Output   1275 ml  Net   -555 ml   Filed Weights   06/11/14 0033 06/11/14 1241 06/12/14 0500  Weight: 58.06 kg (128 lb) 62.9 kg (138 lb 10.7 oz) 65 kg (143 lb 4.8 oz)    Exam:   General:  Alert sitting in the chair, on 2 lit [place] oxygen.   Cardiovascular: s1s2  Respiratory: ctab, no wheezing today  Abdomen: soft nont ender non distended bowel sounds heard  Musculoskeletal: no pedal edema.   Data Reviewed: Basic Metabolic Panel:  Recent Labs Lab 06/11/14 0128 06/12/14 0445  NA 146* 143  K 5.0 4.7  CL 101 96*  CO2 40* 38*  GLUCOSE 103* 129*  BUN 25* 35*  CREATININE 1.29* 1.24  CALCIUM 9.1 9.2   Liver Function Tests:  Recent Labs Lab 06/11/14 0128 06/12/14 0445  AST 69* 39  ALT 70* 70*  ALKPHOS 90 88  BILITOT 0.5 0.7  PROT 6.5 6.2*  ALBUMIN 3.7 3.6   No results for input(s): LIPASE, AMYLASE in the last 168 hours. No results for input(s): AMMONIA in the last 168 hours. CBC:  Recent  Labs Lab 06/11/14 0128 06/12/14 0445  WBC 11.4* 18.0*  NEUTROABS 8.8*  --   HGB 8.7* 8.3*  HCT 29.3* 26.7*  MCV 95.4 89.9  PLT 129* 125*   Cardiac Enzymes: No results for input(s): CKTOTAL, CKMB, CKMBINDEX, TROPONINI in the last 168 hours. BNP (last 3 results)  Recent Labs  05/30/14 2026 06/01/14 1943 06/11/14 0128  BNP 258.2* 835.0* 147.7*    ProBNP (last 3 results) No results for input(s): PROBNP in the last 8760 hours.  CBG: No results for input(s): GLUCAP in the last 168 hours.  No results found for this or any previous visit (from the past 240 hour(s)).   Studies: Ct Head Wo Contrast  06/11/2014   CLINICAL DATA:  Weakness for 1 day  EXAM: CT HEAD WITHOUT CONTRAST  TECHNIQUE: Contiguous axial images were obtained from the base of the skull through the vertex without intravenous contrast.  COMPARISON:  MRI 03/14/2014  FINDINGS: There is no intracranial hemorrhage, mass or evidence of acute infarction. There is [place] matter hypodensity in both cerebral hemispheres, likely due to chronic small vessel ischemic disease. There is mild generalized atrophy. There is no bony abnormality. There is prior sinonasal surgery on the right with the visible paranasal sinuses being clear.  IMPRESSION: No acute findings. Generalized atrophy and moderate chronic small vessel ischemic disease.   Electronically  Signed   By: Chase Johnson M.D.   On: 06/11/2014 06:11   Dg Chest Port 1 View  06/11/2014   CLINICAL DATA:  Dyspnea  EXAM: PORTABLE CHEST - 1 VIEW  COMPARISON:  06/01/2014  FINDINGS: Bullous emphysematous changes are present in the upper lobes. No pneumothorax is evident. No superimposed acute infiltrate or CHF is evident. Heart size is normal, unchanged. Pulmonary vasculature is normal. There is no large effusion.  IMPRESSION: Severe bullous emphysematous changes without evidence of an acute superimposed process.   Electronically Signed   By: Chase Johnson M.D.   On: 06/11/2014 01:32     Scheduled Meds: . amLODipine  10 mg Oral Daily  . amoxicillin-clavulanate  1 tablet Oral BID  . antiseptic oral rinse  7 mL Mouth Rinse q12n4p  . atorvastatin  40 mg Oral Daily  . chlorhexidine  15 mL Mouth Rinse BID  . clopidogrel  75 mg Oral Daily  . dextromethorphan-guaiFENesin  1 tablet Oral BID  . donepezil  5 mg Oral QHS  . furosemide  20 mg Oral Once per day on Sun Tue Thu Sat  . heparin  5,000 Units Subcutaneous 3 times per day  . ipratropium-albuterol  3 mL Nebulization TID  . metoprolol tartrate  37.5 mg Oral BID  . [START ON 06/13/2014] predniSONE  60 mg Oral Q breakfast  . QUEtiapine  200 mg Oral QPM  . tamsulosin  0.4 mg Oral Daily   Continuous Infusions:   Principal Problem:   COPD exacerbation Active Problems:   Peripheral arterial disease   Hyperlipidemia   CAD (coronary artery disease)   HTN (hypertension)   SOB (shortness of breath)   AKI (acute kidney injury)   BPH (benign prostatic hyperplasia)    Time spent: 30 min    Chase Johnson  [facility] Pager [phone] If 7PM-7AM, please contact night-coverage at [email or web address], password [facility] 06/12/2014, 2:44 PM  LOS: 1 day

## 2014-06-13 LAB — BASIC METABOLIC PANEL
Anion gap: 8 (ref 5–15)
BUN: 38 mg/dL — AB (ref 6–20)
CHLORIDE: 97 mmol/L — AB (ref 101–111)
CO2: 40 mmol/L — AB (ref 22–32)
CREATININE: 1.19 mg/dL (ref 0.61–1.24)
Calcium: 8.9 mg/dL (ref 8.9–10.3)
GFR calc Af Amer: >60 mL/min (ref >60)
GFR calc non Af Amer: 55 mL/min — ABNORMAL LOW (ref >60)
Glucose, Bld: 94 mg/dL (ref 65–99)
POTASSIUM: 4.1 mmol/L (ref 3.5–5.1)
Sodium: 145 mmol/L (ref 135–145)

## 2014-06-13 LAB — CBC
HCT: 26.4 % — ABNORMAL LOW (ref 39.0–52.0)
HEMOGLOBIN: 8.1 g/dL — AB (ref 13.0–17.0)
MCH: 27.9 pg (ref 26.0–34.0)
MCHC: 30.7 g/dL (ref 30.0–36.0)
MCV: 91 fL (ref 78.0–100.0)
Platelets: 193 10*3/uL (ref 150–400)
RBC: 2.9 MIL/uL — ABNORMAL LOW (ref 4.22–5.81)
RDW: 15.1 % (ref 11.5–15.5)
WBC: 16.7 10*3/uL — ABNORMAL HIGH (ref 4.0–10.5)

## 2014-06-13 MED ORDER — DM-GUAIFENESIN ER 30-600 MG PO TB12: 1.0000 | ORAL_TABLET | Freq: Two times a day (BID) | ORAL | Status: DC

## 2014-06-13 MED ORDER — IPRATROPIUM-ALBUTEROL 0.5-2.5 (3) MG/3ML IN SOLN: 3.0000 mL | Freq: Four times a day (QID) | RESPIRATORY_TRACT | Status: DC | PRN

## 2014-06-13 MED ORDER — PREDNISONE 20 MG PO TABS: ORAL_TABLET | ORAL | Status: DC

## 2014-06-13 NOTE — Clinical Social Work Note (Signed)
CSW received a call from case manager that pt needed ambulance transport home  CSW met with pt to confirm address, prepared transport paperwork and provided it to RN  RN stated that she will call for transport later   .Dede Query, LCSW [facility] Clinical Social Worker - Weekend Coverage cell #: [phone]

## 2014-06-13 NOTE — Progress Notes (Addendum)
Occupational Therapy Treatment Patient Details Name: Chase Johnson MRN: [id number] DOB: 21-Apr-1930 Today's Date: 06/13/2014    History of present illness Patient is 81 yo whopresents to the ER 06/11/14 for evaluation of shortness of breath. Patient reports that he has been experiencing intermittent sharp pains in the center of his chest and pain in the upper abdomen.  recently in hospital-1 week ago,   OT comments  Pt up to bathroom to perform grooming and functional transfers with min to min guard assist. Pt gets slightly agitated with questions asked regarding how he is feeling and functioning. Noted pt's LEs shaky in standing while using the urinal but when OT asked if his LEs feel weak, he stated, "I am trying to pee." Later when asked about his shakiness, pt stating, "well dont you have to move around some in the mornings to get going?" Will continue to follow on acute to progress safety and independence with ADL.    Follow Up Recommendations  Home health OT;Supervision/Assistance - 24 hour    Equipment Recommendations  None recommended by OT    Recommendations for Other Services      Precautions / Restrictions Precautions Precautions: Fall       Mobility Bed Mobility Overal bed mobility: Modified Independent                Transfers Overall transfer level: Needs assistance Equipment used: None Transfers: Sit to/from Stand Sit to Stand: Min guard         General transfer comment: min guard for safety.    Balance                                   ADL       Grooming: Oral care;Min guard;Standing                   Toilet Transfer: Minimal assistance;Ambulation   Toileting- Clothing Manipulation and Hygiene: Min guard;Sit to/from stand         General ADL Comments: Pt became frustrated with questions regarding how he feels and if his LEs feel weak. He stated, "why do ya'll ask the same questions?" Attempted to explain purpose of  questions and monitoring his progress while on acute. Pt agreeable to up to the bathroom to brush his teeth with min guard required for safety. Min assist to safely transfer out of bathroom to the chair as he tended to step too close to the foot of the bed and was slightly unsteady. Pt states he has arond the clock care at home.       Vision                     Perception     Praxis      Cognition   Behavior During Therapy: Flat affect Overall Cognitive Status: No family/caregiver present to determine baseline cognitive functioning                       Extremity/Trunk Assessment               Exercises     Shoulder Instructions       General Comments      Pertinent Vitals/ Pain       Pain Assessment: No/denies pain  Home Living  Prior Functioning/Environment              Frequency Min 2X/week     Progress Toward Goals  OT Goals(current goals can now be found in the care plan section)  Progress towards OT goals: Progressing toward goals     Plan Discharge plan remains appropriate    Co-evaluation                 End of Session Equipment Utilized During Treatment: Gait belt;Oxygen   Activity Tolerance Patient tolerated treatment well   Patient Left in chair;with call bell/phone within reach;with chair alarm set   Nurse Communication          Time: 6237-6283 OT Time Calculation (min): 32 min  Charges: OT General Charges $OT Visit: 1 Procedure OT Treatments $Self Care/Home Management : 8-22 mins $Therapeutic Activity: 8-22 mins  [name]  [phone] 06/13/2014, 12:30 PM

## 2014-06-13 NOTE — Care Management Note (Signed)
Case Management Note  Patient Details  Name: Chase Johnson MRN: [id number] Date of Birth: 1930-04-20  Subjective/Objective:         Chest pain           Action/Plan: Home with Home Health  Expected Discharge Date:  06/13/2014             Expected Discharge Plan:  [place]  In-House Referral:     Discharge planning Services  CM Consult  Post Acute Care Choice:  Home Health, Resumption of Svcs/PTA Provider (Has 24hrs caregivers) Choice offered to:  Patient  DME Arranged:    DME Agency:   [facility]. Already provides home 02.Patient has travel tank.)  HH Arranged:  RN, PT, OT, Nurse's Aide [place] Agency:  [place]  Status of Service:  Completed, signed off  Medicare Important Message Given:  N/A - LOS <3 / Initial given by admissions Date Medicare IM Given:    Medicare IM give by:    Date Additional Medicare IM Given:    Additional Medicare Important Message give by:     If discussed at [place] of Stay Meetings, dates discussed:    Additional Comments: Consulted for HH. NCM spoke to pt and states he lives at home alone. His neighbor, Kathrine Cords will check on him. Attempted contact to speak with Kathrine Cords [phone]. Left message on voice mail. Pt states he does not have transportation home. Contacted CSW for transportation issues. Contacted [facility] to update with pt's cell number. Updated info in chart. [place] agency was not able to reach pt at home and case was closed.   Erenest Rasher, RN 06/13/2014, 1:56 PM

## 2014-06-15 NOTE — Discharge Summary (Signed)
Physician Discharge Summary  Jerod Mcquain HQI:[id number] DOB: Nov 07, 1930 DOA: 06/11/2014  PCP: [name], PA-C  Admit date: 06/11/2014 Discharge date: 06/13/2014  Time spent: 30 minutes  Recommendations for Outpatient Follow-up:  1. Follow upw ith PCP in one week.   Discharge Diagnoses:  Principal Problem:   COPD exacerbation Active Problems:   Peripheral arterial disease   Hyperlipidemia   CAD (coronary artery disease)   HTN (hypertension)   SOB (shortness of breath)   AKI (acute kidney injury)   BPH (benign prostatic hyperplasia)   Discharge Condition: improved.   Diet recommendation: low sodium diet  Filed Weights   06/11/14 1241 06/12/14 0500 06/13/14 0541  Weight: 62.9 kg (138 lb 10.7 oz) 65 kg (143 lb 4.8 oz) 63 kg (138 lb 14.2 oz)    History of present illness:  Chase Johnson is a 81 y.o. male with PMH of hypertension, hyperlipidemia, COPD, BPH, chronic kidney disease, PVD, CAD, who presents with cough and shortness of breath.  Hospital Course:  1. Acute copd exacerbation: No wheezing today, plan to transition to po steroids  on discharge Bronchodilators, augmentin and mucinex.  PT eval recommending 24 hour supervision, but pt lives alone and refusing to go to a rehab. .   Acute renal failure: Slight improvement.  No hydronephrosis.  Recheck bmp in am shows much improvement.    Hypertension controlled.    Hyperlipidemia: Lipitor.   PAD: plavix  BPH on flomax  Depression: no suicidal or homicidal ideations, continue seroquel.   Procedures:  none  Consultations:  none  Discharge Exam: Filed Vitals:   06/13/14 0943  BP: 165/62  Pulse: 60  Temp:   Resp:     General: alert afebrile comfortable Cardiovascular: s1s2 Respiratory: ctab  Discharge Instructions    Discharge Medication List as of 06/13/2014  2:44 PM    START taking these medications   Details  dextromethorphan-guaiFENesin (MUCINEX DM) 30-600 MG per 12 hr tablet Take 1  tablet by mouth 2 (two) times daily., Starting 06/13/2014, Until Discontinued, Print    ipratropium-albuterol (DUONEB) 0.5-2.5 (3) MG/3ML SOLN Take 3 mLs by nebulization every 6 (six) hours as needed., Starting 06/13/2014, Until Discontinued, Print      CONTINUE these medications which have CHANGED   Details  predniSONE (DELTASONE) 20 MG tablet Prednisone 40 mg daily for 5 days ., Print      CONTINUE these medications which have NOT CHANGED   Details  albuterol (PROVENTIL) (2.5 MG/3ML) 0.083% nebulizer solution Take 3 mLs (2.5 mg total) by nebulization every 4 (four) hours as needed for wheezing or shortness of breath., Starting 06/04/2014, Until Discontinued, Print    amLODipine (NORVASC) 10 MG tablet Take 10 mg by mouth daily., Starting 05/20/2014, Until Discontinued, Historical Med    atorvastatin (LIPITOR) 40 MG tablet Take 40 mg by mouth daily., Starting 05/20/2014, Until Discontinued, Historical Med    budesonide-formoterol (SYMBICORT) 160-4.5 MCG/ACT inhaler Inhale 2 puffs into the lungs 2 (two) times daily., Until Discontinued, Historical Med    clopidogrel (PLAVIX) 75 MG tablet Take 75 mg by mouth daily., Starting 05/20/2014, Until Discontinued, Historical Med    donepezil (ARICEPT) 5 MG tablet Take 5 mg by mouth at bedtime., Starting 05/20/2014, Until Discontinued, Historical Med    furosemide (LASIX) 20 MG tablet Take 20 mg by mouth See admin instructions. Tues, Thurs, Sat, Sun, Until Discontinued, Historical Med    haloperidol (HALDOL) 5 MG tablet Take 5-10 mg by mouth every 6 (six) hours as needed for agitation. , Starting 05/27/2014,  Until Discontinued, Historical Med    metoprolol tartrate (LOPRESSOR) 25 MG tablet Take 37.5 mg by mouth 2 (two) times daily., Starting 05/20/2014, Until Discontinued, Historical Med    PROAIR HFA 108 (90 BASE) MCG/ACT inhaler Inhale 2 puffs into the lungs every 6 (six) hours as needed., Starting 05/20/2014, Until Discontinued, Historical Med     SEROQUEL XR 200 MG 24 hr tablet Take 200 mg by mouth every evening. , Starting 05/26/2014, Until Discontinued, Historical Med    tamsulosin (FLOMAX) 0.4 MG CAPS capsule Take 0.4 mg by mouth daily. , Until Discontinued, Historical Med    nitroGLYCERIN (NITROSTAT) 0.4 MG SL tablet Place 0.4 mg under the tongue every 5 (five) minutes as needed for chest pain., Until Discontinued, Historical Med      STOP taking these medications     QUEtiapine (SEROQUEL) 50 MG tablet      amoxicillin-clavulanate (AUGMENTIN) 875-125 MG per tablet        No Known Allergies    The results of significant diagnostics from this hospitalization (including imaging, microbiology, ancillary and laboratory) are listed below for reference.    Significant Diagnostic Studies: Dg Chest 2 View  05/30/2014   CLINICAL DATA:  Acute onset of shortness of breath. Initial encounter.  EXAM: CHEST  2 VIEW  COMPARISON:  Chest radiograph performed 04/04/2014  FINDINGS: The lungs are well-aerated. Small bilateral pleural effusions are seen. Minimal right midlung opacity may reflect atelectasis or possibly mild pneumonia. No pneumothorax is seen.  The heart is normal in size; the mediastinal contour is within normal limits. No acute osseous abnormalities are seen.  IMPRESSION: Small bilateral pleural effusions noted; minimal right mid lung opacity may reflect atelectasis or possibly mild pneumonia.   Electronically Signed   By: Garald Balding M.D.   On: 05/30/2014 20:43   Ct Head Wo Contrast  06/11/2014   CLINICAL DATA:  Weakness for 1 day  EXAM: CT HEAD WITHOUT CONTRAST  TECHNIQUE: Contiguous axial images were obtained from the base of the skull through the vertex without intravenous contrast.  COMPARISON:  MRI 03/14/2014  FINDINGS: There is no intracranial hemorrhage, mass or evidence of acute infarction. There is Buchholz matter hypodensity in both cerebral hemispheres, likely due to chronic small vessel ischemic disease. There is mild  generalized atrophy. There is no bony abnormality. There is prior sinonasal surgery on the right with the visible paranasal sinuses being clear.  IMPRESSION: No acute findings. Generalized atrophy and moderate chronic small vessel ischemic disease.   Electronically Signed   By: Andreas Newport M.D.   On: 06/11/2014 06:11   US Renal  06/02/2014   CLINICAL DATA:  Acute renal insufficiency  EXAM: RENAL / URINARY TRACT ULTRASOUND COMPLETE  COMPARISON:  renal ultrasound of March 14, 2014  FINDINGS: Right Kidney:  Length: 8.7 cm. The renal cortical echotexture is increased and greater than that of the adjacent liver. There is no hydronephrosis. There is no focal mass.  Left Kidney:  Length: 10.0 cm. The renal cortical echotexture remains increased. There is no hydronephrosis nor focal mass.  Bladder:  The urinary bladder is moderately distended. Bilateral ureteral jets are visible. The prostate gland is enlarged measuring 4 point 8 x 3.6 x 4.9 cm.  IMPRESSION: 1. Increased renal cortical echotexture persists consistent with medical renal disease. There is no parenchymal mass. 2. There is no hydronephrosis. The urinary bladder remains mildly distended but bilateral ureteral jets are visible bilaterally. 3. There is mild enlargement of the prostate gland.  Electronically Signed   By: David  Martinique M.D.   On: 06/02/2014 07:23   Dg Chest Port 1 View  06/11/2014   CLINICAL DATA:  Dyspnea  EXAM: PORTABLE CHEST - 1 VIEW  COMPARISON:  06/01/2014  FINDINGS: Bullous emphysematous changes are present in the upper lobes. No pneumothorax is evident. No superimposed acute infiltrate or CHF is evident. Heart size is normal, unchanged. Pulmonary vasculature is normal. There is no large effusion.  IMPRESSION: Severe bullous emphysematous changes without evidence of an acute superimposed process.   Electronically Signed   By: Andreas Newport M.D.   On: 06/11/2014 01:32   Dg Abd Acute W/chest  06/01/2014   CLINICAL DATA:   Chest/abdominal pain, shortness of breath  EXAM: DG ABDOMEN ACUTE W/ 1V CHEST  COMPARISON:  None.  FINDINGS: Lungs are clear.  No pleural effusion or pneumothorax.  The heart is normal in size.  Nonobstructive bowel gas pattern.  No evidence of free air under the diaphragm on the upright view.  Visualized osseous structures are within normal limits.  IMPRESSION: No evidence of acute cardiopulmonary disease.  No evidence of small bowel obstruction or free air.   Electronically Signed   By: Julian Hy M.D.   On: 06/01/2014 19:42    Microbiology: No results found for this or any previous visit (from the past 240 hour(s)).   Labs: Basic Metabolic Panel:  Recent Labs Lab 06/11/14 0128 06/12/14 0445 06/13/14 0528  NA 146* 143 145  K 5.0 4.7 4.1  CL 101 96* 97*  CO2 40* 38* 40*  GLUCOSE 103* 129* 94  BUN 25* 35* 38*  CREATININE 1.29* 1.24 1.19  CALCIUM 9.1 9.2 8.9   Liver Function Tests:  Recent Labs Lab 06/11/14 0128 06/12/14 0445  AST 69* 39  ALT 70* 70*  ALKPHOS 90 88  BILITOT 0.5 0.7  PROT 6.5 6.2*  ALBUMIN 3.7 3.6   No results for input(s): LIPASE, AMYLASE in the last 168 hours. No results for input(s): AMMONIA in the last 168 hours. CBC:  Recent Labs Lab 06/11/14 0128 06/12/14 0445 06/13/14 0528  WBC 11.4* 18.0* 16.7*  NEUTROABS 8.8*  --   --   HGB 8.7* 8.3* 8.1*  HCT 29.3* 26.7* 26.4*  MCV 95.4 89.9 91.0  PLT 129* 125* 193   Cardiac Enzymes: No results for input(s): CKTOTAL, CKMB, CKMBINDEX, TROPONINI in the last 168 hours. BNP: BNP (last 3 results)  Recent Labs  05/30/14 2026 06/01/14 1943 06/11/14 0128  BNP 258.2* 835.0* 147.7*    ProBNP (last 3 results) No results for input(s): PROBNP in the last 8760 hours.  CBG: No results for input(s): GLUCAP in the last 168 hours.     SignedHosie Poisson  [facility] 06/15/2014, 9:33 AM

## 2014-06-16 DIAGNOSIS — Z9181 History of falling: Secondary | ICD-10-CM | POA: Diagnosis not present

## 2014-06-16 DIAGNOSIS — I739 Peripheral vascular disease, unspecified: Secondary | ICD-10-CM | POA: Diagnosis not present

## 2014-06-16 DIAGNOSIS — Z9981 Dependence on supplemental oxygen: Secondary | ICD-10-CM | POA: Diagnosis not present

## 2014-06-16 DIAGNOSIS — N189 Chronic kidney disease, unspecified: Secondary | ICD-10-CM | POA: Diagnosis not present

## 2014-06-16 DIAGNOSIS — F329 Major depressive disorder, single episode, unspecified: Secondary | ICD-10-CM | POA: Diagnosis not present

## 2014-06-16 DIAGNOSIS — F319 Bipolar disorder, unspecified: Secondary | ICD-10-CM | POA: Diagnosis not present

## 2014-06-16 DIAGNOSIS — I129 Hypertensive chronic kidney disease with stage 1 through stage 4 chronic kidney disease, or unspecified chronic kidney disease: Secondary | ICD-10-CM | POA: Diagnosis not present

## 2014-06-16 DIAGNOSIS — I2510 Atherosclerotic heart disease of native coronary artery without angina pectoris: Secondary | ICD-10-CM | POA: Diagnosis not present

## 2014-06-16 DIAGNOSIS — F0390 Unspecified dementia without behavioral disturbance: Secondary | ICD-10-CM | POA: Diagnosis not present

## 2014-06-16 DIAGNOSIS — E785 Hyperlipidemia, unspecified: Secondary | ICD-10-CM | POA: Diagnosis not present

## 2014-06-16 DIAGNOSIS — G4733 Obstructive sleep apnea (adult) (pediatric): Secondary | ICD-10-CM | POA: Diagnosis not present

## 2014-06-16 DIAGNOSIS — J449 Chronic obstructive pulmonary disease, unspecified: Secondary | ICD-10-CM | POA: Diagnosis not present

## 2014-06-16 DIAGNOSIS — Z87891 Personal history of nicotine dependence: Secondary | ICD-10-CM | POA: Diagnosis not present

## 2014-06-17 ENCOUNTER — Encounter (HOSPITAL_COMMUNITY): Payer: Self-pay | Admitting: Emergency Medicine

## 2014-06-17 ENCOUNTER — Emergency Department (HOSPITAL_COMMUNITY): Payer: Medicare Other

## 2014-06-17 ENCOUNTER — Emergency Department (HOSPITAL_COMMUNITY)
Admission: EM | Admit: 2014-06-17 | Discharge: 2014-06-17 | Disposition: A | Discharge status: Home / Self Care | Payer: Medicare Other | Attending: Emergency Medicine | Admitting: Emergency Medicine

## 2014-06-17 DIAGNOSIS — I2510 Atherosclerotic heart disease of native coronary artery without angina pectoris: Secondary | ICD-10-CM | POA: Diagnosis not present

## 2014-06-17 DIAGNOSIS — Z7951 Long term (current) use of inhaled steroids: Secondary | ICD-10-CM | POA: Diagnosis not present

## 2014-06-17 DIAGNOSIS — N189 Chronic kidney disease, unspecified: Secondary | ICD-10-CM | POA: Insufficient documentation

## 2014-06-17 DIAGNOSIS — N400 Enlarged prostate without lower urinary tract symptoms: Secondary | ICD-10-CM | POA: Insufficient documentation

## 2014-06-17 DIAGNOSIS — Z87891 Personal history of nicotine dependence: Secondary | ICD-10-CM | POA: Diagnosis not present

## 2014-06-17 DIAGNOSIS — F0390 Unspecified dementia without behavioral disturbance: Secondary | ICD-10-CM | POA: Diagnosis not present

## 2014-06-17 DIAGNOSIS — J80 Acute respiratory distress syndrome: Secondary | ICD-10-CM | POA: Diagnosis not present

## 2014-06-17 DIAGNOSIS — Z79899 Other long term (current) drug therapy: Secondary | ICD-10-CM | POA: Insufficient documentation

## 2014-06-17 DIAGNOSIS — I129 Hypertensive chronic kidney disease with stage 1 through stage 4 chronic kidney disease, or unspecified chronic kidney disease: Secondary | ICD-10-CM | POA: Diagnosis not present

## 2014-06-17 DIAGNOSIS — J441 Chronic obstructive pulmonary disease with (acute) exacerbation: Secondary | ICD-10-CM | POA: Diagnosis not present

## 2014-06-17 DIAGNOSIS — E785 Hyperlipidemia, unspecified: Secondary | ICD-10-CM | POA: Insufficient documentation

## 2014-06-17 DIAGNOSIS — I739 Peripheral vascular disease, unspecified: Secondary | ICD-10-CM | POA: Diagnosis not present

## 2014-06-17 DIAGNOSIS — J449 Chronic obstructive pulmonary disease, unspecified: Secondary | ICD-10-CM | POA: Diagnosis not present

## 2014-06-17 DIAGNOSIS — Z7902 Long term (current) use of antithrombotics/antiplatelets: Secondary | ICD-10-CM | POA: Diagnosis not present

## 2014-06-17 DIAGNOSIS — R0602 Shortness of breath: Secondary | ICD-10-CM | POA: Diagnosis not present

## 2014-06-17 LAB — TROPONIN I
TROPONIN I: 0.04 ng/mL — AB (ref <0.031)
Troponin I: 0.03 ng/mL (ref <0.031)

## 2014-06-17 LAB — CBC WITH DIFFERENTIAL/PLATELET
BASOS PCT: 0 % (ref 0–1)
Basophils Absolute: 0 10*3/uL (ref 0.0–0.1)
Eosinophils Absolute: 0 10*3/uL (ref 0.0–0.7)
Eosinophils Relative: 0 % (ref 0–5)
HEMATOCRIT: 26.2 % — AB (ref 39.0–52.0)
HEMOGLOBIN: 8 g/dL — AB (ref 13.0–17.0)
LYMPHS PCT: 5 % — AB (ref 12–46)
Lymphs Abs: 0.7 10*3/uL (ref 0.7–4.0)
MCH: 27.6 pg (ref 26.0–34.0)
MCHC: 30.5 g/dL (ref 30.0–36.0)
MCV: 90.3 fL (ref 78.0–100.0)
MONO ABS: 0.9 10*3/uL (ref 0.1–1.0)
MONOS PCT: 6 % (ref 3–12)
NEUTROS ABS: 13 10*3/uL — AB (ref 1.7–7.7)
Neutrophils Relative %: 89 % — ABNORMAL HIGH (ref 43–77)
Platelets: 177 10*3/uL (ref 150–400)
RBC: 2.9 MIL/uL — AB (ref 4.22–5.81)
RDW: 15.7 % — ABNORMAL HIGH (ref 11.5–15.5)
WBC: 14.6 10*3/uL — ABNORMAL HIGH (ref 4.0–10.5)

## 2014-06-17 LAB — BASIC METABOLIC PANEL
ANION GAP: 6 (ref 5–15)
BUN: 23 mg/dL — ABNORMAL HIGH (ref 6–20)
CO2: 39 mmol/L — ABNORMAL HIGH (ref 22–32)
CREATININE: 1.27 mg/dL — AB (ref 0.61–1.24)
Calcium: 8.8 mg/dL — ABNORMAL LOW (ref 8.9–10.3)
Chloride: 100 mmol/L — ABNORMAL LOW (ref 101–111)
GFR calc Af Amer: 59 mL/min — ABNORMAL LOW (ref >60)
GFR calc non Af Amer: 51 mL/min — ABNORMAL LOW (ref >60)
Glucose, Bld: 86 mg/dL (ref 65–99)
Potassium: 4.3 mmol/L (ref 3.5–5.1)
Sodium: 145 mmol/L (ref 135–145)

## 2014-06-17 MED ORDER — METHYLPREDNISOLONE SODIUM SUCC 125 MG IJ SOLR
125.0000 mg | Freq: Once | INTRAMUSCULAR | Status: AC
  Administered 2014-06-17: 125 mg via INTRAVENOUS
  Filled 2014-06-17: qty 2

## 2014-06-17 MED ORDER — ACETAMINOPHEN 500 MG PO TABS
1000.0000 mg | ORAL_TABLET | Freq: Once | ORAL | Status: AC
  Administered 2014-06-17: 1000 mg via ORAL
  Filled 2014-06-17: qty 2

## 2014-06-17 MED ORDER — PREDNISONE 20 MG PO TABS: 60.0000 mg | ORAL_TABLET | Freq: Every day | ORAL | Status: DC

## 2014-06-17 MED ORDER — ALBUTEROL (5 MG/ML) CONTINUOUS INHALATION SOLN
10.0000 mg/h | INHALATION_SOLUTION | Freq: Once | RESPIRATORY_TRACT | Status: AC
  Administered 2014-06-17: 10 mg/h via RESPIRATORY_TRACT
  Filled 2014-06-17: qty 20

## 2014-06-17 NOTE — ED Notes (Signed)
[name] is pt's POA phone number [phone], code for any information of the pt 10-bear.

## 2014-06-17 NOTE — Discharge Instructions (Signed)

## 2014-06-17 NOTE — ED Notes (Signed)
Patient come from Home. EMS came to the patient's home this morning for complaints of SOB Patient was 83% but refused to come to the hospital. Then tonight patient started feeling SOB and did home Neb and states did not work so called EMS. EMS states o2 was 87% on RA so gave the patient 5 albuterol & .5 Atrovent. Patient received half of neb treatment when he started complaining he could not breathe. Patient is 96% on 2L on arrival.

## 2014-06-17 NOTE — ED Notes (Signed)
Patient o2 stats stayed 94%-96% while ambulating  MD made aware. Patient had no complaints of dizziness and gait was steady.

## 2014-06-17 NOTE — ED Notes (Signed)
Patient states, " I want a different room my TV is not working and why did you close my window" this nurse with EMT melinda advised patient a request  Has been placed for the TV and that the ER is currently full he can not be moved at this time and that the window was closed to provide privacy  while patient urinated. Patient states, " I do not want to give priviacy this is a hospital no one should care about privacy. Advised patient, I can open window however when you have to urinate the window must be closed.

## 2014-06-17 NOTE — ED Notes (Signed)
[facility] has arrived, patient given verbal discharge instructions and a copy of his prescriptions.  Physical copy of prescription and discharge instructions given to [facility] EMT.  Patient denies cp.  NAD noted. VSS

## 2014-06-17 NOTE — ED Provider Notes (Signed)
CSN: [id number]     Arrival date & time 06/17/14  0112 History  This chart was scribed for Linton Flemings, MD by Rayfield Citizen, ED Scribe. This patient was seen in room A09C/A09C and the patient's care was started at 2:11 AM.    Level 5 Caveat; Mental Status Decline  Chief Complaint  Patient presents with  . Shortness of Breath   The history is provided by the patient. No language interpreter was used.     HPI Comments: Chase Johnson is a 81 y.o. male brought in by ambulance who presents to the Emergency Department complaining of SOB and 20 years of ongoing abdominal pain, radiating into his chest and back. Per nurse's note, EMS was called to patient's home this morning after complaints of SOB; at that time, patient's sats were 83% but he refused to come to the hospital at that time. Tonight, patient felt SOB again which did not improve after a home nebulizer treatment - he called EMS again at that time. Patient's O2 sats at 87% on RA; he was given 5 albuterol and .5 Atrovent. He denies current steroid use.   Patient states he is a former smoker; quit date 04/2014.   Past Medical History  Diagnosis Date  . CKD (chronic kidney disease)   . CAD (coronary artery disease)   . HTN (hypertension)   . PTSD (post-traumatic stress disorder)   . Tobacco abuse   . Hyperlipidemia   . COPD (chronic obstructive pulmonary disease)   . Peripheral arterial disease   . BPH (benign prostatic hyperplasia)    Past Surgical History  Procedure Laterality Date  . Prostate surgery      for BPH, but not know the detail by pt   Family History  Problem Relation Age of Onset  . Heart attack Father   . Hyperlipidemia Father   . Hypertension Father   . Stroke Father    History  Substance Use Topics  . Smoking status: Former Smoker    Types: Cigarettes    Quit date: 12/01/2012  . Smokeless tobacco: Not on file  . Alcohol Use: No    Review of Systems  Respiratory: Positive for shortness of breath.    Gastrointestinal: Positive for abdominal pain.  All other systems reviewed and are negative.     Allergies  Review of patient's allergies indicates no known allergies.  Home Medications   Prior to Admission medications   Medication Sig Start Date End Date Taking? Authorizing Provider  albuterol (PROVENTIL) (2.5 MG/3ML) 0.083% nebulizer solution Take 3 mLs (2.5 mg total) by nebulization every 4 (four) hours as needed for wheezing or shortness of breath. 06/04/14   Robbie Lis, MD  amLODipine (NORVASC) 10 MG tablet Take 10 mg by mouth daily. 05/20/14   Historical Provider, MD  atorvastatin (LIPITOR) 40 MG tablet Take 40 mg by mouth daily. 05/20/14   Historical Provider, MD  budesonide-formoterol (SYMBICORT) 160-4.5 MCG/ACT inhaler Inhale 2 puffs into the lungs 2 (two) times daily.    Historical Provider, MD  clopidogrel (PLAVIX) 75 MG tablet Take 75 mg by mouth daily. 05/20/14   Historical Provider, MD  dextromethorphan-guaiFENesin (MUCINEX DM) 30-600 MG per 12 hr tablet Take 1 tablet by mouth 2 (two) times daily. 06/13/14   Hosie Poisson, MD  donepezil (ARICEPT) 5 MG tablet Take 5 mg by mouth at bedtime. 05/20/14   Historical Provider, MD  furosemide (LASIX) 20 MG tablet Take 20 mg by mouth See admin instructions. Tues, Thurs, Sat, Sun  Historical Provider, MD  haloperidol (HALDOL) 5 MG tablet Take 5-10 mg by mouth every 6 (six) hours as needed for agitation.  05/27/14   Historical Provider, MD  ipratropium-albuterol (DUONEB) 0.5-2.5 (3) MG/3ML SOLN Take 3 mLs by nebulization every 6 (six) hours as needed. 06/13/14   Hosie Poisson, MD  metoprolol tartrate (LOPRESSOR) 25 MG tablet Take 37.5 mg by mouth 2 (two) times daily. 05/20/14   Historical Provider, MD  nitroGLYCERIN (NITROSTAT) 0.4 MG SL tablet Place 0.4 mg under the tongue every 5 (five) minutes as needed for chest pain.    Historical Provider, MD  predniSONE (DELTASONE) 20 MG tablet Prednisone 40 mg daily for 5 days . 06/13/14   Hosie Poisson,  MD  PROAIR HFA 108 (90 BASE) MCG/ACT inhaler Inhale 2 puffs into the lungs every 6 (six) hours as needed. 05/20/14   Historical Provider, MD  SEROQUEL XR 200 MG 24 hr tablet Take 200 mg by mouth every evening.  05/26/14   Historical Provider, MD  tamsulosin (FLOMAX) 0.4 MG CAPS capsule Take 0.4 mg by mouth daily.     Historical Provider, MD   BP 157/61 mmHg  Pulse 86  Temp(Src) 98.5 F (36.9 C) (Oral)  Resp 15  SpO2 100% Physical Exam  Constitutional: He is oriented to person, place, and time. He appears well-developed and well-nourished. No distress.  HENT:  Head: Normocephalic and atraumatic.  Mouth/Throat: Oropharynx is clear and moist. No oropharyngeal exudate.  Moist mucous membranes  Eyes: EOM are normal. Pupils are equal, round, and reactive to light.  Neck: Normal range of motion. Neck supple. No JVD present.  Cardiovascular: Normal rate, regular rhythm and normal heart sounds.  Exam reveals no gallop and no friction rub.   No murmur heard. Pulmonary/Chest: Effort normal. No respiratory distress.  Crackles in bases; poor air exchange, prolonged expiratory phase  Abdominal: Soft. Bowel sounds are normal. He exhibits no mass. There is no tenderness. There is no rebound and no guarding.  Musculoskeletal: Normal range of motion. He exhibits no edema.  Moves all extremities normally.   Lymphadenopathy:    He has no cervical adenopathy.  Neurological: He is alert and oriented to person, place, and time. He displays normal reflexes.  Skin: Skin is warm and dry. No rash noted.  Psychiatric: He has a normal mood and affect. His behavior is normal.  Nursing note and vitals reviewed.   ED Course  Procedures   DIAGNOSTIC STUDIES: Oxygen Saturation is 100% on [place] 2L/min, normal by my interpretation.    COORDINATION OF CARE: 2:17 AM Discussed treatment plan with pt at bedside and pt agreed to plan.   Labs Review Labs Reviewed  BASIC METABOLIC PANEL - Abnormal; Notable for the  following:    Chloride 100 (*)    CO2 39 (*)    BUN 23 (*)    Creatinine, Ser 1.27 (*)    Calcium 8.8 (*)    GFR calc non Af Amer 51 (*)    GFR calc Af Amer 59 (*)    All other components within normal limits  TROPONIN I - Abnormal; Notable for the following:    Troponin I 0.04 (*)    All other components within normal limits  CBC WITH DIFFERENTIAL/PLATELET - Abnormal; Notable for the following:    WBC 14.6 (*)    RBC 2.90 (*)    Hemoglobin 8.0 (*)    HCT 26.2 (*)    RDW 15.7 (*)    Neutrophils Relative % 89 (*)  Neutro Abs 13.0 (*)    Lymphocytes Relative 5 (*)    All other components within normal limits  TROPONIN I    Imaging Review Dg Chest Port 1 View  06/17/2014   CLINICAL DATA:  Shortness of breath tonight.  EXAM: PORTABLE CHEST - 1 VIEW  COMPARISON:  06/11/2014  FINDINGS: Emphysematous changes in the lungs. Normal heart size and pulmonary vascularity. No focal airspace disease or consolidation in the lungs. No blunting of costophrenic angles. No pneumothorax. Mediastinal contours appear intact.  IMPRESSION: Emphysematous changes in the lungs. No evidence of active pulmonary disease.   Electronically Signed   By: Lucienne Capers M.D.   On: 06/17/2014 02:41     EKG Interpretation   Date/Time:  Wednesday June 17 2014 01:25:52 EDT Ventricular Rate:  89 PR Interval:  112 QRS Duration: 75 QT Interval:  350 QTC Calculation: 426 R Axis:   89 Text Interpretation:  Sinus rhythm Atrial premature complexes Borderline  short PR interval Borderline right axis deviation Confirmed by Yisroel Mullendore  MD,  Laquesha Holcomb ([id number]) on 06/17/2014 3:17:38 AM      MDM   Final diagnoses:  SOB (shortness of breath)  COPD exacerbation    I personally performed the services described in this documentation, which was scribed in my presence. The recorded information has been reviewed and is accurate.  81 year old male with shortness of breath starting this evening, recent discharge for same 5 days  ago.  Patient is a poor historian given his underlying dementia and PTSD.  Patient with hypoxia with EMS.  Prolonged expirations and crackles in bases.  Plan for labs, chest x-ray, continuous neb treatment.  Patient gives history of ongoing long standing abdominal pain radiating up into his stomach, chest and back.  Will add on EKG and troponin.  Initial troponin slightly elevated.  Patient feeling much better after continuous neb.  He is ambulated in the department without drop in sat.  He reports that his abdominal knot type pain has resolved.  I suspect that his troponin was a leak secondary to his tachycardia.  The repeat troponin is normal.  EKG without ischemia.  Patient wishes to go home, does not wish to be admitted at this time.  He reports that he will follow-up with his doctor this week.   Linton Flemings, MD 06/17/14 [phone]

## 2014-06-23 DIAGNOSIS — H2513 Age-related nuclear cataract, bilateral: Secondary | ICD-10-CM | POA: Diagnosis not present

## 2014-06-23 DIAGNOSIS — H524 Presbyopia: Secondary | ICD-10-CM | POA: Diagnosis not present

## 2014-06-25 ENCOUNTER — Emergency Department (HOSPITAL_COMMUNITY): Payer: Medicare Other

## 2014-06-25 ENCOUNTER — Encounter (HOSPITAL_COMMUNITY): Payer: Self-pay | Admitting: *Deleted

## 2014-06-25 ENCOUNTER — Inpatient Hospital Stay (HOSPITAL_COMMUNITY)
Admission: EM | Admit: 2014-06-25 | Discharge: 2014-07-03 | DRG: 190 | Disposition: A | Discharge status: Skilled Nursing Facility | Payer: Medicare Other | Attending: Internal Medicine | Admitting: Internal Medicine

## 2014-06-25 DIAGNOSIS — E785 Hyperlipidemia, unspecified: Secondary | ICD-10-CM | POA: Diagnosis present

## 2014-06-25 DIAGNOSIS — W19XXXA Unspecified fall, initial encounter: Secondary | ICD-10-CM | POA: Diagnosis present

## 2014-06-25 DIAGNOSIS — N400 Enlarged prostate without lower urinary tract symptoms: Secondary | ICD-10-CM | POA: Diagnosis present

## 2014-06-25 DIAGNOSIS — J9622 Acute and chronic respiratory failure with hypercapnia: Secondary | ICD-10-CM | POA: Diagnosis present

## 2014-06-25 DIAGNOSIS — R410 Disorientation, unspecified: Secondary | ICD-10-CM | POA: Diagnosis not present

## 2014-06-25 DIAGNOSIS — N179 Acute kidney failure, unspecified: Secondary | ICD-10-CM | POA: Diagnosis not present

## 2014-06-25 DIAGNOSIS — R296 Repeated falls: Secondary | ICD-10-CM | POA: Diagnosis present

## 2014-06-25 DIAGNOSIS — N183 Chronic kidney disease, stage 3 (moderate): Secondary | ICD-10-CM | POA: Diagnosis present

## 2014-06-25 DIAGNOSIS — M6281 Muscle weakness (generalized): Secondary | ICD-10-CM | POA: Diagnosis not present

## 2014-06-25 DIAGNOSIS — J9621 Acute and chronic respiratory failure with hypoxia: Secondary | ICD-10-CM | POA: Diagnosis present

## 2014-06-25 DIAGNOSIS — R0602 Shortness of breath: Secondary | ICD-10-CM | POA: Diagnosis not present

## 2014-06-25 DIAGNOSIS — S0990XA Unspecified injury of head, initial encounter: Secondary | ICD-10-CM | POA: Diagnosis not present

## 2014-06-25 DIAGNOSIS — R262 Difficulty in walking, not elsewhere classified: Secondary | ICD-10-CM | POA: Diagnosis not present

## 2014-06-25 DIAGNOSIS — J9601 Acute respiratory failure with hypoxia: Secondary | ICD-10-CM | POA: Diagnosis present

## 2014-06-25 DIAGNOSIS — F4312 Post-traumatic stress disorder, chronic: Secondary | ICD-10-CM | POA: Diagnosis not present

## 2014-06-25 DIAGNOSIS — K922 Gastrointestinal hemorrhage, unspecified: Secondary | ICD-10-CM | POA: Insufficient documentation

## 2014-06-25 DIAGNOSIS — I509 Heart failure, unspecified: Secondary | ICD-10-CM | POA: Diagnosis not present

## 2014-06-25 DIAGNOSIS — R0902 Hypoxemia: Secondary | ICD-10-CM | POA: Diagnosis not present

## 2014-06-25 DIAGNOSIS — M1990 Unspecified osteoarthritis, unspecified site: Secondary | ICD-10-CM | POA: Diagnosis not present

## 2014-06-25 DIAGNOSIS — J441 Chronic obstructive pulmonary disease with (acute) exacerbation: Secondary | ICD-10-CM | POA: Diagnosis not present

## 2014-06-25 DIAGNOSIS — J449 Chronic obstructive pulmonary disease, unspecified: Secondary | ICD-10-CM

## 2014-06-25 DIAGNOSIS — M6259 Muscle wasting and atrophy, not elsewhere classified, multiple sites: Secondary | ICD-10-CM | POA: Diagnosis not present

## 2014-06-25 DIAGNOSIS — I209 Angina pectoris, unspecified: Secondary | ICD-10-CM | POA: Diagnosis not present

## 2014-06-25 DIAGNOSIS — R079 Chest pain, unspecified: Secondary | ICD-10-CM | POA: Diagnosis not present

## 2014-06-25 DIAGNOSIS — D631 Anemia in chronic kidney disease: Secondary | ICD-10-CM | POA: Diagnosis not present

## 2014-06-25 DIAGNOSIS — I82721 Chronic embolism and thrombosis of deep veins of right upper extremity: Secondary | ICD-10-CM | POA: Diagnosis not present

## 2014-06-25 DIAGNOSIS — W19XXXS Unspecified fall, sequela: Secondary | ICD-10-CM | POA: Diagnosis not present

## 2014-06-25 DIAGNOSIS — D509 Iron deficiency anemia, unspecified: Secondary | ICD-10-CM | POA: Diagnosis not present

## 2014-06-25 DIAGNOSIS — E869 Volume depletion, unspecified: Secondary | ICD-10-CM | POA: Diagnosis present

## 2014-06-25 DIAGNOSIS — Z87891 Personal history of nicotine dependence: Secondary | ICD-10-CM | POA: Diagnosis not present

## 2014-06-25 DIAGNOSIS — F419 Anxiety disorder, unspecified: Secondary | ICD-10-CM | POA: Diagnosis not present

## 2014-06-25 DIAGNOSIS — F0390 Unspecified dementia without behavioral disturbance: Secondary | ICD-10-CM | POA: Diagnosis present

## 2014-06-25 DIAGNOSIS — L509 Urticaria, unspecified: Secondary | ICD-10-CM | POA: Diagnosis present

## 2014-06-25 DIAGNOSIS — I739 Peripheral vascular disease, unspecified: Secondary | ICD-10-CM | POA: Diagnosis present

## 2014-06-25 DIAGNOSIS — D649 Anemia, unspecified: Secondary | ICD-10-CM | POA: Diagnosis not present

## 2014-06-25 DIAGNOSIS — E870 Hyperosmolality and hypernatremia: Secondary | ICD-10-CM | POA: Diagnosis present

## 2014-06-25 DIAGNOSIS — Z8673 Personal history of transient ischemic attack (TIA), and cerebral infarction without residual deficits: Secondary | ICD-10-CM | POA: Diagnosis not present

## 2014-06-25 DIAGNOSIS — J90 Pleural effusion, not elsewhere classified: Secondary | ICD-10-CM | POA: Diagnosis not present

## 2014-06-25 DIAGNOSIS — S3992XA Unspecified injury of lower back, initial encounter: Secondary | ICD-10-CM | POA: Diagnosis not present

## 2014-06-25 DIAGNOSIS — M545 Low back pain: Secondary | ICD-10-CM | POA: Diagnosis not present

## 2014-06-25 DIAGNOSIS — I2510 Atherosclerotic heart disease of native coronary artery without angina pectoris: Secondary | ICD-10-CM | POA: Diagnosis not present

## 2014-06-25 DIAGNOSIS — D500 Iron deficiency anemia secondary to blood loss (chronic): Secondary | ICD-10-CM | POA: Diagnosis present

## 2014-06-25 DIAGNOSIS — Z66 Do not resuscitate: Secondary | ICD-10-CM | POA: Diagnosis present

## 2014-06-25 DIAGNOSIS — I129 Hypertensive chronic kidney disease with stage 1 through stage 4 chronic kidney disease, or unspecified chronic kidney disease: Secondary | ICD-10-CM | POA: Diagnosis present

## 2014-06-25 DIAGNOSIS — F4310 Post-traumatic stress disorder, unspecified: Secondary | ICD-10-CM | POA: Diagnosis present

## 2014-06-25 DIAGNOSIS — R778 Other specified abnormalities of plasma proteins: Secondary | ICD-10-CM

## 2014-06-25 DIAGNOSIS — R195 Other fecal abnormalities: Secondary | ICD-10-CM | POA: Diagnosis present

## 2014-06-25 DIAGNOSIS — J9602 Acute respiratory failure with hypercapnia: Secondary | ICD-10-CM

## 2014-06-25 DIAGNOSIS — R7989 Other specified abnormal findings of blood chemistry: Secondary | ICD-10-CM

## 2014-06-25 DIAGNOSIS — I10 Essential (primary) hypertension: Secondary | ICD-10-CM | POA: Diagnosis not present

## 2014-06-25 DIAGNOSIS — S20211A Contusion of right front wall of thorax, initial encounter: Secondary | ICD-10-CM

## 2014-06-25 DIAGNOSIS — R489 Unspecified symbolic dysfunctions: Secondary | ICD-10-CM | POA: Diagnosis not present

## 2014-06-25 DIAGNOSIS — M25511 Pain in right shoulder: Secondary | ICD-10-CM | POA: Diagnosis not present

## 2014-06-25 DIAGNOSIS — Z9181 History of falling: Secondary | ICD-10-CM | POA: Diagnosis not present

## 2014-06-25 DIAGNOSIS — D696 Thrombocytopenia, unspecified: Secondary | ICD-10-CM | POA: Diagnosis not present

## 2014-06-25 DIAGNOSIS — J9610 Chronic respiratory failure, unspecified whether with hypoxia or hypercapnia: Secondary | ICD-10-CM | POA: Diagnosis not present

## 2014-06-25 DIAGNOSIS — J9811 Atelectasis: Secondary | ICD-10-CM | POA: Diagnosis not present

## 2014-06-25 DIAGNOSIS — E46 Unspecified protein-calorie malnutrition: Secondary | ICD-10-CM | POA: Diagnosis not present

## 2014-06-25 LAB — URINALYSIS, ROUTINE W REFLEX MICROSCOPIC
Bilirubin Urine: NEGATIVE
Glucose, UA: NEGATIVE mg/dL
HGB URINE DIPSTICK: NEGATIVE
Ketones, ur: NEGATIVE mg/dL
LEUKOCYTES UA: NEGATIVE
Nitrite: NEGATIVE
PROTEIN: 30 mg/dL — AB
SPECIFIC GRAVITY, URINE: 1.015 (ref 1.005–1.030)
Urobilinogen, UA: 0.2 mg/dL (ref 0.0–1.0)
pH: 5.5 (ref 5.0–8.0)

## 2014-06-25 LAB — CBC WITH DIFFERENTIAL/PLATELET
BASOS ABS: 0 10*3/uL (ref 0.0–0.1)
BASOS PCT: 0 % (ref 0–1)
Eosinophils Absolute: 0 10*3/uL (ref 0.0–0.7)
Eosinophils Relative: 0 % (ref 0–5)
HCT: 25.4 % — ABNORMAL LOW (ref 39.0–52.0)
Hemoglobin: 7.7 g/dL — ABNORMAL LOW (ref 13.0–17.0)
Lymphocytes Relative: 3 % — ABNORMAL LOW (ref 12–46)
Lymphs Abs: 0.4 10*3/uL — ABNORMAL LOW (ref 0.7–4.0)
MCH: 28.6 pg (ref 26.0–34.0)
MCHC: 30.3 g/dL (ref 30.0–36.0)
MCV: 94.4 fL (ref 78.0–100.0)
Monocytes Absolute: 0.5 10*3/uL (ref 0.1–1.0)
Monocytes Relative: 3 % (ref 3–12)
NEUTROS ABS: 13.6 10*3/uL — AB (ref 1.7–7.7)
NEUTROS PCT: 94 % — AB (ref 43–77)
PLATELETS: 126 10*3/uL — AB (ref 150–400)
RBC: 2.69 MIL/uL — ABNORMAL LOW (ref 4.22–5.81)
RDW: 15.4 % (ref 11.5–15.5)
WBC: 14.4 10*3/uL — ABNORMAL HIGH (ref 4.0–10.5)

## 2014-06-25 LAB — COMPREHENSIVE METABOLIC PANEL
ALK PHOS: 150 U/L — AB (ref 38–126)
ALT: 116 U/L — AB (ref 17–63)
ANION GAP: 4 — AB (ref 5–15)
AST: 156 U/L — ABNORMAL HIGH (ref 15–41)
Albumin: 3.8 g/dL (ref 3.5–5.0)
BILIRUBIN TOTAL: 0.9 mg/dL (ref 0.3–1.2)
BUN: 17 mg/dL (ref 6–20)
CO2: 43 mmol/L — AB (ref 22–32)
Calcium: 9 mg/dL (ref 8.9–10.3)
Chloride: 99 mmol/L — ABNORMAL LOW (ref 101–111)
Creatinine, Ser: 1.33 mg/dL — ABNORMAL HIGH (ref 0.61–1.24)
GFR calc Af Amer: 56 mL/min — ABNORMAL LOW (ref >60)
GFR, EST NON AFRICAN AMERICAN: 48 mL/min — AB (ref >60)
Glucose, Bld: 130 mg/dL — ABNORMAL HIGH (ref 65–99)
POTASSIUM: 4.8 mmol/L (ref 3.5–5.1)
SODIUM: 146 mmol/L — AB (ref 135–145)
TOTAL PROTEIN: 6.8 g/dL (ref 6.5–8.1)

## 2014-06-25 LAB — RAPID URINE DRUG SCREEN, HOSP PERFORMED
Amphetamines: NOT DETECTED
Barbiturates: NOT DETECTED
Benzodiazepines: NOT DETECTED
Cocaine: NOT DETECTED
OPIATES: NOT DETECTED
TETRAHYDROCANNABINOL: NOT DETECTED

## 2014-06-25 LAB — I-STAT ARTERIAL BLOOD GAS, ED
ACID-BASE EXCESS: 17 mmol/L — AB (ref 0.0–2.0)
Bicarbonate: 47.1 mEq/L — ABNORMAL HIGH (ref 20.0–24.0)
O2 SAT: 100 %
pCO2 arterial: 98.2 mmHg (ref 35.0–45.0)
pH, Arterial: 7.29 — ABNORMAL LOW (ref 7.350–7.450)
pO2, Arterial: 212 mmHg — ABNORMAL HIGH (ref 80.0–100.0)

## 2014-06-25 LAB — URINE MICROSCOPIC-ADD ON

## 2014-06-25 LAB — TROPONIN I: TROPONIN I: 0.03 ng/mL (ref <0.031)

## 2014-06-25 LAB — ETHANOL: Alcohol, Ethyl (B): <5 mg/dL (ref <5)

## 2014-06-25 MED ORDER — METOPROLOL TARTRATE 12.5 MG HALF TABLET
37.5000 mg | ORAL_TABLET | Freq: Two times a day (BID) | ORAL | Status: DC
  Administered 2014-06-26 – 2014-07-02 (×14): 37.5 mg via ORAL
  Filled 2014-06-25 (×17): qty 1

## 2014-06-25 MED ORDER — ONDANSETRON HCL 4 MG/2ML IJ SOLN
4.0000 mg | Freq: Four times a day (QID) | INTRAMUSCULAR | Status: DC | PRN
  Administered 2014-06-26: 4 mg via INTRAVENOUS
  Filled 2014-06-25: qty 2

## 2014-06-25 MED ORDER — SODIUM CHLORIDE 0.9 % IV SOLN
INTRAVENOUS | Status: DC
  Administered 2014-06-26: 01:00:00 via INTRAVENOUS

## 2014-06-25 MED ORDER — IPRATROPIUM BROMIDE 0.02 % IN SOLN
0.5000 mg | Freq: Once | RESPIRATORY_TRACT | Status: AC
  Administered 2014-06-25: 0.5 mg via RESPIRATORY_TRACT
  Filled 2014-06-25: qty 2.5

## 2014-06-25 MED ORDER — TAMSULOSIN HCL 0.4 MG PO CAPS
0.4000 mg | ORAL_CAPSULE | Freq: Every day | ORAL | Status: DC
  Administered 2014-06-26 – 2014-07-03 (×7): 0.4 mg via ORAL
  Filled 2014-06-25 (×9): qty 1

## 2014-06-25 MED ORDER — ATORVASTATIN CALCIUM 40 MG PO TABS
40.0000 mg | ORAL_TABLET | Freq: Every day | ORAL | Status: DC
  Administered 2014-06-26 – 2014-07-03 (×8): 40 mg via ORAL
  Filled 2014-06-25 (×8): qty 1

## 2014-06-25 MED ORDER — CLOPIDOGREL BISULFATE 75 MG PO TABS
75.0000 mg | ORAL_TABLET | Freq: Every day | ORAL | Status: DC
  Administered 2014-06-26 – 2014-06-27 (×2): 75 mg via ORAL
  Filled 2014-06-25 (×2): qty 1

## 2014-06-25 MED ORDER — SODIUM CHLORIDE 0.9 % IJ SOLN
3.0000 mL | Freq: Two times a day (BID) | INTRAMUSCULAR | Status: DC
  Administered 2014-06-26 – 2014-07-03 (×15): 3 mL via INTRAVENOUS

## 2014-06-25 MED ORDER — ONDANSETRON HCL 4 MG PO TABS
4.0000 mg | ORAL_TABLET | Freq: Four times a day (QID) | ORAL | Status: DC | PRN
  Administered 2014-06-26: 4 mg via ORAL
  Filled 2014-06-25: qty 1

## 2014-06-25 MED ORDER — IPRATROPIUM-ALBUTEROL 0.5-2.5 (3) MG/3ML IN SOLN
3.0000 mL | RESPIRATORY_TRACT | Status: DC
  Administered 2014-06-26 – 2014-06-27 (×8): 3 mL via RESPIRATORY_TRACT
  Filled 2014-06-25 (×8): qty 3

## 2014-06-25 MED ORDER — BUDESONIDE-FORMOTEROL FUMARATE 160-4.5 MCG/ACT IN AERO
2.0000 | INHALATION_SPRAY | Freq: Two times a day (BID) | RESPIRATORY_TRACT | Status: DC
  Administered 2014-06-27 – 2014-07-03 (×12): 2 via RESPIRATORY_TRACT
  Filled 2014-06-25 (×2): qty 6

## 2014-06-25 MED ORDER — ALBUTEROL SULFATE (2.5 MG/3ML) 0.083% IN NEBU
5.0000 mg | INHALATION_SOLUTION | Freq: Once | RESPIRATORY_TRACT | Status: AC
  Administered 2014-06-25: 5 mg via RESPIRATORY_TRACT
  Filled 2014-06-25: qty 6

## 2014-06-25 MED ORDER — PREDNISONE 20 MG PO TABS
40.0000 mg | ORAL_TABLET | Freq: Once | ORAL | Status: AC
  Administered 2014-06-25: 40 mg via ORAL
  Filled 2014-06-25: qty 2

## 2014-06-25 MED ORDER — FENTANYL CITRATE (PF) 100 MCG/2ML IJ SOLN
50.0000 ug | Freq: Once | INTRAMUSCULAR | Status: AC
  Administered 2014-06-25: 50 ug via INTRAVENOUS
  Filled 2014-06-25: qty 2

## 2014-06-25 MED ORDER — DONEPEZIL HCL 5 MG PO TABS
5.0000 mg | ORAL_TABLET | Freq: Every day | ORAL | Status: DC
  Administered 2014-06-26 – 2014-07-02 (×8): 5 mg via ORAL
  Filled 2014-06-25 (×9): qty 1

## 2014-06-25 MED ORDER — AMLODIPINE BESYLATE 10 MG PO TABS
10.0000 mg | ORAL_TABLET | Freq: Every day | ORAL | Status: DC
  Administered 2014-06-26 – 2014-07-03 (×7): 10 mg via ORAL
  Filled 2014-06-25 (×10): qty 1

## 2014-06-25 MED ORDER — HALOPERIDOL 5 MG PO TABS
5.0000 mg | ORAL_TABLET | Freq: Four times a day (QID) | ORAL | Status: DC | PRN
  Administered 2014-06-26 – 2014-06-29 (×2): 5 mg via ORAL
  Administered 2014-06-30 – 2014-07-03 (×5): 10 mg via ORAL
  Filled 2014-06-25 (×10): qty 2

## 2014-06-25 MED ORDER — QUETIAPINE FUMARATE ER 200 MG PO TB24
200.0000 mg | ORAL_TABLET | Freq: Every evening | ORAL | Status: DC
  Administered 2014-06-26 – 2014-07-02 (×7): 200 mg via ORAL
  Filled 2014-06-25 (×8): qty 1

## 2014-06-25 NOTE — ED Notes (Signed)
Admitting MD at bedside.

## 2014-06-25 NOTE — ED Notes (Signed)
Pt desat down to 70%. O2 increased to 4L via [place]. Dr. Alvino Chapel informed.

## 2014-06-25 NOTE — Progress Notes (Signed)
RT informed MD of panic values from [facility].

## 2014-06-25 NOTE — ED Notes (Signed)
Respiratory at bedside.

## 2014-06-25 NOTE — ED Provider Notes (Signed)
CSN: [id number]     Arrival date & time 06/25/14  1702 History   First MD Initiated Contact with Patient 06/25/14 1717     Chief Complaint  Patient presents with  . Fall     (Consider location/radiation/quality/duration/timing/severity/associated sxs/prior Treatment) Patient is a 81 y.o. male presenting with fall. The history is provided by the patient.  Fall Associated symptoms include chest pain and shortness of breath. Pertinent negatives include no abdominal pain and no headaches.   patient had a fall. Sates he tripped over his feet and hit the door. Some pain to his right chest. Also states he had some pain in his abdomen. States her plaques in there that are moving around and states that sometimes he'll get his left shoulder and is right shoulder also. Denies loss conscious. States he did not hit his head or his neck. No numbness or weakness. Did have desats to 70% in the ER. He is on chronic oxygen.  Past Medical History  Diagnosis Date  . CKD (chronic kidney disease)   . CAD (coronary artery disease)   . HTN (hypertension)   . PTSD (post-traumatic stress disorder)   . Tobacco abuse   . Hyperlipidemia   . COPD (chronic obstructive pulmonary disease)   . Peripheral arterial disease   . BPH (benign prostatic hyperplasia)    Past Surgical History  Procedure Laterality Date  . Prostate surgery      for BPH, but not know the detail by pt   Family History  Problem Relation Age of Onset  . Heart attack Father   . Hyperlipidemia Father   . Hypertension Father   . Stroke Father    History  Substance Use Topics  . Smoking status: Former Smoker    Types: Cigarettes    Quit date: 12/01/2012  . Smokeless tobacco: Not on file  . Alcohol Use: No    Review of Systems  Constitutional: Negative for activity change and appetite change.  Eyes: Negative for pain.  Respiratory: Positive for cough and shortness of breath. Negative for chest tightness.   Cardiovascular: Positive  for chest pain. Negative for leg swelling.  Gastrointestinal: Negative for nausea, vomiting, abdominal pain and diarrhea.  Genitourinary: Negative for flank pain.  Musculoskeletal: Negative for back pain and neck stiffness.  Skin: Negative for rash.  Neurological: Positive for weakness. Negative for numbness and headaches.  Psychiatric/Behavioral: Negative for behavioral problems.      Allergies  Review of patient's allergies indicates no known allergies.  Home Medications   Prior to Admission medications   Medication Sig Start Date End Date Taking? Authorizing Provider  albuterol (PROVENTIL) (2.5 MG/3ML) 0.083% nebulizer solution Take 3 mLs (2.5 mg total) by nebulization every 4 (four) hours as needed for wheezing or shortness of breath. 06/04/14  Yes Robbie Lis, MD  amLODipine (NORVASC) 10 MG tablet Take 10 mg by mouth daily. 05/20/14  Yes Historical Provider, MD  atorvastatin (LIPITOR) 40 MG tablet Take 40 mg by mouth daily. 05/20/14  Yes Historical Provider, MD  budesonide-formoterol (SYMBICORT) 160-4.5 MCG/ACT inhaler Inhale 2 puffs into the lungs 2 (two) times daily.   Yes Historical Provider, MD  clopidogrel (PLAVIX) 75 MG tablet Take 75 mg by mouth daily. 05/20/14  Yes Historical Provider, MD  donepezil (ARICEPT) 5 MG tablet Take 5 mg by mouth at bedtime. 05/20/14  Yes Historical Provider, MD  furosemide (LASIX) 20 MG tablet Take 20 mg by mouth See admin instructions. Tues, Thurs, Sat, Sun   Yes Historical  Provider, MD  haloperidol (HALDOL) 5 MG tablet Take 5-10 mg by mouth every 6 (six) hours as needed for agitation.  05/27/14  Yes Historical Provider, MD  metoprolol tartrate (LOPRESSOR) 25 MG tablet Take 37.5 mg by mouth 2 (two) times daily. 05/20/14  Yes Historical Provider, MD  nitroGLYCERIN (NITROSTAT) 0.4 MG SL tablet Place 0.4 mg under the tongue every 5 (five) minutes as needed for chest pain.   Yes Historical Provider, MD  PROAIR HFA 108 (90 BASE) MCG/ACT inhaler Inhale 2  puffs into the lungs every 6 (six) hours as needed. 05/20/14  Yes Historical Provider, MD  SEROQUEL XR 200 MG 24 hr tablet Take 200 mg by mouth every evening.  05/26/14  Yes Historical Provider, MD  tamsulosin (FLOMAX) 0.4 MG CAPS capsule Take 0.4 mg by mouth daily.    Yes Historical Provider, MD  dextromethorphan-guaiFENesin (MUCINEX DM) 30-600 MG per 12 hr tablet Take 1 tablet by mouth 2 (two) times daily. 06/13/14   Hosie Poisson, MD  ipratropium-albuterol (DUONEB) 0.5-2.5 (3) MG/3ML SOLN Take 3 mLs by nebulization every 6 (six) hours as needed. 06/13/14   Hosie Poisson, MD  predniSONE (DELTASONE) 20 MG tablet Take 3 tablets (60 mg total) by mouth daily. 06/17/14   Linton Flemings, MD   BP 153/83 mmHg  Pulse 71  Temp(Src) 97.9 F (36.6 C) (Oral)  Resp 12  Ht 6' (1.829 m)  Wt 130 lb (58.968 kg)  BMI 17.63 kg/m2  SpO2 96% Physical Exam  Constitutional: He appears well-developed.  HENT:  Head: Normocephalic and atraumatic.  Eyes: Pupils are equal, round, and reactive to light.  Cardiovascular: Normal rate.   Pulmonary/Chest:  Somewhat decreased air movement throughout. Some tenderness to right upper chest.  Abdominal: There is no tenderness.  Neurological: He is alert.  Oriented to self and place and was able to tell me the date of month but not year. Awake and follows commands. Some confusion.  Skin: Skin is warm.    ED Course  Procedures (including critical care time) Labs Review Labs Reviewed  URINALYSIS, ROUTINE W REFLEX MICROSCOPIC (NOT AT [facility]) - Abnormal; Notable for the following:    Protein, ur 30 (*)    All other components within normal limits  URINE MICROSCOPIC-ADD ON - Abnormal; Notable for the following:    Casts HYALINE CASTS (*)    All other components within normal limits  COMPREHENSIVE METABOLIC PANEL - Abnormal; Notable for the following:    Sodium 146 (*)    Chloride 99 (*)    CO2 43 (*)    Glucose, Bld 130 (*)    Creatinine, Ser 1.33 (*)    AST 156 (*)    ALT  116 (*)    Alkaline Phosphatase 150 (*)    GFR calc non Af Amer 48 (*)    GFR calc Af Amer 56 (*)    Anion gap 4 (*)    All other components within normal limits  CBC WITH DIFFERENTIAL/PLATELET - Abnormal; Notable for the following:    WBC 14.4 (*)    RBC 2.69 (*)    Hemoglobin 7.7 (*)    HCT 25.4 (*)    Platelets 126 (*)    Neutrophils Relative % 94 (*)    Neutro Abs 13.6 (*)    Lymphocytes Relative 3 (*)    Lymphs Abs 0.4 (*)    All other components within normal limits  ETHANOL  TROPONIN I  URINE RAPID DRUG SCREEN, HOSP PERFORMED    Imaging  Review Dg Ribs Unilateral W/chest Right  06/25/2014   CLINICAL DATA:  The patient tripped today. Right chest pain. Initial encounter.  EXAM: RIGHT RIBS AND CHEST - 3+ VIEW  COMPARISON:  None.  FINDINGS: No fracture or other bone lesions are seen involving the ribs. There is no evidence of pneumothorax. Very small left effusion is noted. Both lungs are clear. Heart size and mediastinal contours are within normal limits.  IMPRESSION: Negative for fracture.  Small left pleural effusion.   Electronically Signed   By: Inge Rise M.D.   On: 06/25/2014 18:18   Dg Lumbar Spine Complete  06/25/2014   CLINICAL DATA:  Acute lower back pain after fall today. Initial encounter.  EXAM: LUMBAR SPINE - COMPLETE 4+ VIEW  COMPARISON:  None.  FINDINGS: No fracture or spondylolisthesis is noted. Moderate degenerative disc disease is noted at L3-4, L4-5 and L5-S1 with anterior osteophyte formation at these levels. Degenerative joint disease of posterior facet joints of lower lumbar spine are noted.  IMPRESSION: Multilevel degenerative disc disease. No acute abnormality seen in the lumbar spine.   Electronically Signed   By: Marijo Conception, M.D.   On: 06/25/2014 19:33   Ct Head Wo Contrast  06/25/2014   CLINICAL DATA:  Patient with recent fall and confusion. Patient fell through the door.  EXAM: CT HEAD WITHOUT CONTRAST  TECHNIQUE: Contiguous axial images were  obtained from the base of the skull through the vertex without intravenous contrast.  COMPARISON:  Brain CT 06/11/2014  FINDINGS: Ventricles and sulci are appropriate for patient's age. Mild periventricular and subcortical Bordwell matter hypodensity compatible with chronic small vessel ischemic changes. No evidence for acute cortically based infarct, intracranial hemorrhage, mass lesion or mass effect. Mucosal thickening within the ethmoid air cells. Status post sinus surgery. Mastoid air cells are well aerated. Calvarium is intact.  IMPRESSION: No acute intracranial process.  Chronic small vessel ischemic changes.   Electronically Signed   By: Lovey Newcomer M.D.   On: 06/25/2014 19:58     EKG Interpretation   Date/Time:  Thursday June 25 2014 19:08:13 EDT Ventricular Rate:  70 PR Interval:  116 QRS Duration: 76 QT Interval:  388 QTC Calculation: 419 R Axis:   89 Text Interpretation:  Sinus rhythm Borderline short PR interval Borderline  right axis deviation Nonspecific T abnormalities, lateral leads simmilar  to previous tracing 05/28/14 Confirmed by Alvino Chapel  MD, Ovid Curd [phone]) on  06/25/2014 7:10:20 PM      MDM   Final diagnoses:  None    Patient with fall. Bump his right chest. While in the ER his power of attorney who is his neighbor states that he is supposed be placed in a nursing home on Monday and has not been doing well at home. Patient did have desats down into the 50s when he attempted to get up without his oxygen. There is some confusion. He is requiring more than his baseline 2 L of oxygen. Lab work overall stable. Will admit to internal medicine.    Davonna Belling, MD 06/27/14 [phone]

## 2014-06-25 NOTE — ED Notes (Addendum)
Pt XXX code is tin bear per POA [name] [phone].

## 2014-06-25 NOTE — ED Notes (Signed)
Pt arrives from home via [facility]. Pt tripped through a door frame and now has pain under his right shoulder blade around his right side. Pt denies neck/back pain and denies LOC. Pt states, "I just tripped over my feet".

## 2014-06-25 NOTE — Progress Notes (Signed)
Placed pt on BiPAP at this time per MD order. Pt tolerating fairly well at this time RT will continue to monitor.

## 2014-06-25 NOTE — H&P (Signed)
[facility] History and Physical  Patient: Chase Johnson  MRN: [id number]  DOB: 11/25/1930  DOS: the patient was seen and examined on 06/25/2014 PCP: Tyna Jaksch  Referring physician: Dr. Alvino Chapel Chief Complaint: Fall  HPI: Chase Johnson is a 81 y.o. male with Past medical history of coronary artery disease, COPD, hypertension, peripheral vascular disease, dyslipidemia. The patient is presenting with complaints of a fall. I reported to provide the patient was walking he tripped over something and fell on the ground. There was no head injury had a headache or neck injury reported. He did have some pain on his right shoulder as per initial documentation with the patient was talking about left shoulder pain to me. He denies any fever or chills nausea vomiting diarrhea or constipation. Patient has baseline cognitive imbalance and therefore history is limited. Patient mentions he is taking all his medications. He denies any recent other falls. Also discussed the patient's neighbor wheezes healthcare power of attorney [name], he mentions since last 2 days the patient has not been acting himself and has not been able to do his daily activity on his own. Patient also earlier also had a fall in which [name] had hard time lifting him up from the ground. Also the patient had 24 7 help available earlier at home but right now due to increasing cost of the services patient did not have that help recently.  The patient is coming from home. And at his baseline dependent for most of his ADL.  Review of Systems: as mentioned in the history of present illness.  A comprehensive review of the other systems is negative.  Past Medical History  Diagnosis Date  . CKD (chronic kidney disease)   . CAD (coronary artery disease)   . HTN (hypertension)   . PTSD (post-traumatic stress disorder)   . Tobacco abuse   . Hyperlipidemia   . COPD (chronic obstructive pulmonary disease)    . Peripheral arterial disease   . BPH (benign prostatic hyperplasia)    Past Surgical History  Procedure Laterality Date  . Prostate surgery      for BPH, but not know the detail by pt   Social History:  reports that he quit smoking about 18 months ago. His smoking use included Cigarettes. He does not have any smokeless tobacco history on file. He reports that he does not drink alcohol. His drug history is not on file.  No Known Allergies  Family History  Problem Relation Age of Onset  . Heart attack Father   . Hyperlipidemia Father   . Hypertension Father   . Stroke Father     Prior to Admission medications   Medication Sig Start Date End Date Taking? Authorizing Provider  albuterol (PROVENTIL) (2.5 MG/3ML) 0.083% nebulizer solution Take 3 mLs (2.5 mg total) by nebulization every 4 (four) hours as needed for wheezing or shortness of breath. 06/04/14  Yes Robbie Lis, MD  amLODipine (NORVASC) 10 MG tablet Take 10 mg by mouth daily. 05/20/14  Yes Historical Provider, MD  atorvastatin (LIPITOR) 40 MG tablet Take 40 mg by mouth daily. 05/20/14  Yes Historical Provider, MD  budesonide-formoterol (SYMBICORT) 160-4.5 MCG/ACT inhaler Inhale 2 puffs into the lungs 2 (two) times daily.   Yes Historical Provider, MD  clopidogrel (PLAVIX) 75 MG tablet Take 75 mg by mouth daily. 05/20/14  Yes Historical Provider, MD  donepezil (ARICEPT) 5 MG tablet Take 5 mg by mouth at bedtime. 05/20/14  Yes Historical Provider, MD  furosemide (LASIX) 20 MG tablet Take 20 mg by mouth See admin instructions. Tues, Thurs, Sat, Sun   Yes Historical Provider, MD  haloperidol (HALDOL) 5 MG tablet Take 5-10 mg by mouth every 6 (six) hours as needed for agitation.  05/27/14  Yes Historical Provider, MD  metoprolol tartrate (LOPRESSOR) 25 MG tablet Take 37.5 mg by mouth 2 (two) times daily. 05/20/14  Yes Historical Provider, MD  nitroGLYCERIN (NITROSTAT) 0.4 MG SL tablet Place 0.4 mg under the tongue every 5 (five) minutes  as needed for chest pain.   Yes Historical Provider, MD  PROAIR HFA 108 (90 BASE) MCG/ACT inhaler Inhale 2 puffs into the lungs every 6 (six) hours as needed. 05/20/14  Yes Historical Provider, MD  SEROQUEL XR 200 MG 24 hr tablet Take 200 mg by mouth every evening.  05/26/14  Yes Historical Provider, MD  tamsulosin (FLOMAX) 0.4 MG CAPS capsule Take 0.4 mg by mouth daily.    Yes Historical Provider, MD  dextromethorphan-guaiFENesin (MUCINEX DM) 30-600 MG per 12 hr tablet Take 1 tablet by mouth 2 (two) times daily. 06/13/14   Hosie Poisson, MD  ipratropium-albuterol (DUONEB) 0.5-2.5 (3) MG/3ML SOLN Take 3 mLs by nebulization every 6 (six) hours as needed. 06/13/14   Hosie Poisson, MD  predniSONE (DELTASONE) 20 MG tablet Take 3 tablets (60 mg total) by mouth daily. 06/17/14   Linton Flemings, MD    Physical Exam: Filed Vitals:   06/25/14 2200 06/25/14 2215 06/25/14 2230 06/25/14 2300  BP: 173/67 171/57 153/69 126/73  Pulse: 66 49 74 68  Temp:      TempSrc:      Resp: 16 17 15 17   Height:      Weight:      SpO2: 100% 88% 97% 98%    General: Alert, Awake and Oriented to Time, Place and Person. Appear in mild distress Eyes: PERRL ENT: Oral Mucosa clear moist. Neck: no JVD Cardiovascular: S1 and S2 Present, aortic systolic Murmur, Peripheral Pulses Present Respiratory: Bilateral Air entry equal and Decreased,  Clear to Auscultation, no Crackles, no wheezes Abdomen: Bowel Sound present, Soft and non tender Skin: no Rash Extremities: no Pedal edema, no calf tenderness Neurologic: Grossly no focal neuro deficit.  Labs on Admission:  CBC:  Recent Labs Lab 06/25/14 1912  WBC 14.4*  NEUTROABS 13.6*  HGB 7.7*  HCT 25.4*  MCV 94.4  PLT 126*    CMP     Component Value Date/Time   NA 146* 06/25/2014 1912   K 4.8 06/25/2014 1912   CL 99* 06/25/2014 1912   CO2 43* 06/25/2014 1912   GLUCOSE 130* 06/25/2014 1912   BUN 17 06/25/2014 1912   CREATININE 1.33* 06/25/2014 1912   CALCIUM 9.0  06/25/2014 1912   PROT 6.8 06/25/2014 1912   ALBUMIN 3.8 06/25/2014 1912   AST 156* 06/25/2014 1912   ALT 116* 06/25/2014 1912   ALKPHOS 150* 06/25/2014 1912   BILITOT 0.9 06/25/2014 1912   GFRNONAA 48* 06/25/2014 1912   GFRAA 56* 06/25/2014 1912    No results for input(s): LIPASE, AMYLASE in the last 168 hours.   Recent Labs Lab 06/25/14 1912  TROPONINI 0.03   BNP (last 3 results)  Recent Labs  05/30/14 2026 06/01/14 1943 06/11/14 0128  BNP 258.2* 835.0* 147.7*    ProBNP (last 3 results) No results for input(s): PROBNP in the last 8760 hours.   Radiological Exams on Admission: Dg Ribs Unilateral W/chest Right  06/25/2014   CLINICAL DATA:  The patient  tripped today. Right chest pain. Initial encounter.  EXAM: RIGHT RIBS AND CHEST - 3+ VIEW  COMPARISON:  None.  FINDINGS: No fracture or other bone lesions are seen involving the ribs. There is no evidence of pneumothorax. Very small left effusion is noted. Both lungs are clear. Heart size and mediastinal contours are within normal limits.  IMPRESSION: Negative for fracture.  Small left pleural effusion.   Electronically Signed   By: Inge Rise M.D.   On: 06/25/2014 18:18   Dg Lumbar Spine Complete  06/25/2014   CLINICAL DATA:  Acute lower back pain after fall today. Initial encounter.  EXAM: LUMBAR SPINE - COMPLETE 4+ VIEW  COMPARISON:  None.  FINDINGS: No fracture or spondylolisthesis is noted. Moderate degenerative disc disease is noted at L3-4, L4-5 and L5-S1 with anterior osteophyte formation at these levels. Degenerative joint disease of posterior facet joints of lower lumbar spine are noted.  IMPRESSION: Multilevel degenerative disc disease. No acute abnormality seen in the lumbar spine.   Electronically Signed   By: Marijo Conception, M.D.   On: 06/25/2014 19:33   Ct Head Wo Contrast  06/25/2014   CLINICAL DATA:  Patient with recent fall and confusion. Patient fell through the door.  EXAM: CT HEAD WITHOUT CONTRAST   TECHNIQUE: Contiguous axial images were obtained from the base of the skull through the vertex without intravenous contrast.  COMPARISON:  Brain CT 06/11/2014  FINDINGS: Ventricles and sulci are appropriate for patient's age. Mild periventricular and subcortical Brinker matter hypodensity compatible with chronic small vessel ischemic changes. No evidence for acute cortically based infarct, intracranial hemorrhage, mass lesion or mass effect. Mucosal thickening within the ethmoid air cells. Status post sinus surgery. Mastoid air cells are well aerated. Calvarium is intact.  IMPRESSION: No acute intracranial process.  Chronic small vessel ischemic changes.   Electronically Signed   By: Lovey Newcomer M.D.   On: 06/25/2014 19:58   EKG: Independently reviewed. normal sinus rhythm, nonspecific ST and T waves changes.  Assessment/Plan Principal Problem:   Acute respiratory failure with hypoxia and hypercarbia Active Problems:   Peripheral arterial disease   CAD (coronary artery disease)   HTN (hypertension)   COPD exacerbation   Fall   1. Acute respiratory failure with hypoxia and hypercarbia The patient is presenting with fall. He was found to have some confusion as well. CT of the head is unremarkable. ABG shows significant hypercarbia with acute hypercarbic respiratory failure. With probable exacerbation of his COPD. I will place him on Solu-Medrol 60 daily, duo nebs, ceftriaxone azithromycin for antibiotics. Continue with BiPAP. Repeat ABG in 1 hour to monitor improvement in hypercarbia. Patient appears to have chronic hypercarbic respiratory failure with significantly high PCO2 at his baseline.  2. Multiple lab abnormalities. The patient is appearing to have anemia, hypernatremia, elevated LFTs. With this multitude of finding the patient does not have any other complaints other than just fall. I will recheck all his labs as well as lactic acid reticulocyte count And screen and iron studies for  further information.  3. Fall. The patient is presenting with fall. His CT head, lumbar spine, x-ray chest x-ray ribs, are unremarkable. We will consult physical therapy and occupational therapy in the morning. Patient may require placement since he does not have 24/7 care at home as per caretaker.  4. Coronary artery disease. Continuing Plavix.  5. History of dementia. Continue Aricept, when necessary Haldol, continue Seroquel.  Advance goals of care discussion: DNR/DNI as per my discussion  with patient as well as patient's primary caretaker and power of attorney.  DVT Prophylaxis: mechanical compression device . Nutrition: Nothing by mouth at present  Family Communication: Discussed with primary caretaker [name] over the phone, opportunity was given to ask question and all questions were answered satisfactorily at the time of interview. Disposition: Admitted as inpatient, step-down unit.  Author: Berle Mull, MD [facility] Pager: [phone] 06/25/2014  If 7PM-7AM, please contact night-coverage [email or web address] Password [email or web address]

## 2014-06-25 NOTE — ED Notes (Signed)
Pt transported back to xray

## 2014-06-25 NOTE — ED Notes (Signed)
Respiratory notified for bipap.

## 2014-06-25 NOTE — ED Notes (Addendum)
Patient visualized by RN at the end of bed with oxygen off, pulled pulse ox off and urine all over the floor, with call bell in reach. Rn went into room to assist patient. Patient repeatedly saying I cannot breath and hyperventilating with shallow breaths appx 30-40breaths per min. RN placed pt back on 2L [place] that was laying on bed and placed pt back on pulse Ox. SpO2 with good wave form at 58-65%/2L. Pt instructed to take slow deep breath in through nose and out through mouth and O2 increased to 6L [place] with SpO2 at 70-80%/6L [place]. Dr. Alvino Chapel called and patient placed on NRB for appx 1 minute. SpO2 quickly rose to 98%/ NRB and breathing treatment was ordered.  Patient and urine cleaned and RN explained plan of care to patient, new gown, socks and blanket given. Rn explained cal bell again, door left open and in view of nurses station. Pt agrees to use call bell with further needs and sts will not stand up without assistance.

## 2014-06-26 DIAGNOSIS — E870 Hyperosmolality and hypernatremia: Secondary | ICD-10-CM | POA: Diagnosis present

## 2014-06-26 DIAGNOSIS — W19XXXA Unspecified fall, initial encounter: Secondary | ICD-10-CM | POA: Diagnosis present

## 2014-06-26 DIAGNOSIS — R7989 Other specified abnormal findings of blood chemistry: Secondary | ICD-10-CM

## 2014-06-26 DIAGNOSIS — R778 Other specified abnormalities of plasma proteins: Secondary | ICD-10-CM

## 2014-06-26 LAB — COMPREHENSIVE METABOLIC PANEL
ALBUMIN: 3.8 g/dL (ref 3.5–5.0)
ALK PHOS: 156 U/L — AB (ref 38–126)
ALT: 112 U/L — AB (ref 17–63)
ALT: 93 U/L — AB (ref 17–63)
AST: 85 U/L — ABNORMAL HIGH (ref 15–41)
AST: 54 U/L — ABNORMAL HIGH (ref 15–41)
Albumin: 3.5 g/dL (ref 3.5–5.0)
Alkaline Phosphatase: 133 U/L — ABNORMAL HIGH (ref 38–126)
Anion gap: 11 (ref 5–15)
Anion gap: 13 (ref 5–15)
BILIRUBIN TOTAL: 0.8 mg/dL (ref 0.3–1.2)
BUN: 21 mg/dL — ABNORMAL HIGH (ref 6–20)
BUN: 22 mg/dL — ABNORMAL HIGH (ref 6–20)
CALCIUM: 9.2 mg/dL (ref 8.9–10.3)
CHLORIDE: 96 mmol/L — AB (ref 101–111)
CO2: 41 mmol/L — ABNORMAL HIGH (ref 22–32)
CO2: 38 mmol/L — AB (ref 22–32)
Calcium: 9.5 mg/dL (ref 8.9–10.3)
Chloride: 95 mmol/L — ABNORMAL LOW (ref 101–111)
Creatinine, Ser: 1.25 mg/dL — ABNORMAL HIGH (ref 0.61–1.24)
Creatinine, Ser: 1.25 mg/dL — ABNORMAL HIGH (ref 0.61–1.24)
GFR calc Af Amer: >60 mL/min (ref >60)
GFR calc non Af Amer: 52 mL/min — ABNORMAL LOW (ref >60)
GFR calc non Af Amer: 52 mL/min — ABNORMAL LOW (ref >60)
GLUCOSE: 109 mg/dL — AB (ref 65–99)
Glucose, Bld: 119 mg/dL — ABNORMAL HIGH (ref 65–99)
POTASSIUM: 4.8 mmol/L (ref 3.5–5.1)
Potassium: 4.7 mmol/L (ref 3.5–5.1)
SODIUM: 148 mmol/L — AB (ref 135–145)
SODIUM: 146 mmol/L — AB (ref 135–145)
TOTAL PROTEIN: 6.2 g/dL — AB (ref 6.5–8.1)
TOTAL PROTEIN: 5.7 g/dL — AB (ref 6.5–8.1)
Total Bilirubin: 0.5 mg/dL (ref 0.3–1.2)

## 2014-06-26 LAB — CBC WITH DIFFERENTIAL/PLATELET
BASOS ABS: 0 10*3/uL (ref 0.0–0.1)
BASOS ABS: 0 10*3/uL (ref 0.0–0.1)
BASOS PCT: 0 % (ref 0–1)
Basophils Relative: 0 % (ref 0–1)
EOS ABS: 0 10*3/uL (ref 0.0–0.7)
EOS PCT: 0 % (ref 0–5)
Eosinophils Absolute: 0 10*3/uL (ref 0.0–0.7)
Eosinophils Relative: 0 % (ref 0–5)
HCT: 23.5 % — ABNORMAL LOW (ref 39.0–52.0)
HEMATOCRIT: 24.8 % — AB (ref 39.0–52.0)
HEMOGLOBIN: 7.5 g/dL — AB (ref 13.0–17.0)
Hemoglobin: 7.3 g/dL — ABNORMAL LOW (ref 13.0–17.0)
LYMPHS PCT: 4 % — AB (ref 12–46)
Lymphocytes Relative: 3 % — ABNORMAL LOW (ref 12–46)
Lymphs Abs: 0.5 10*3/uL — ABNORMAL LOW (ref 0.7–4.0)
Lymphs Abs: 0.4 10*3/uL — ABNORMAL LOW (ref 0.7–4.0)
MCH: 28 pg (ref 26.0–34.0)
MCH: 28.5 pg (ref 26.0–34.0)
MCHC: 30.2 g/dL (ref 30.0–36.0)
MCHC: 31.1 g/dL (ref 30.0–36.0)
MCV: 92.5 fL (ref 78.0–100.0)
MCV: 91.8 fL (ref 78.0–100.0)
MONOS PCT: 2 % — AB (ref 3–12)
Monocytes Absolute: 0.3 10*3/uL (ref 0.1–1.0)
Monocytes Absolute: 0.2 10*3/uL (ref 0.1–1.0)
Monocytes Relative: 2 % — ABNORMAL LOW (ref 3–12)
NEUTROS ABS: 12.7 10*3/uL — AB (ref 1.7–7.7)
NEUTROS ABS: 11.9 10*3/uL — AB (ref 1.7–7.7)
NEUTROS PCT: 95 % — AB (ref 43–77)
Neutrophils Relative %: 94 % — ABNORMAL HIGH (ref 43–77)
Platelets: 89 10*3/uL — ABNORMAL LOW (ref 150–400)
Platelets: 98 10*3/uL — ABNORMAL LOW (ref 150–400)
RBC: 2.68 MIL/uL — AB (ref 4.22–5.81)
RBC: 2.56 MIL/uL — ABNORMAL LOW (ref 4.22–5.81)
RDW: 15.4 % (ref 11.5–15.5)
RDW: 15.3 % (ref 11.5–15.5)
WBC: 13.5 10*3/uL — ABNORMAL HIGH (ref 4.0–10.5)
WBC: 12.5 10*3/uL — ABNORMAL HIGH (ref 4.0–10.5)

## 2014-06-26 LAB — BLOOD GAS, ARTERIAL
ACID-BASE EXCESS: 15.4 mmol/L — AB (ref 0.0–2.0)
Bicarbonate: 40.7 mEq/L — ABNORMAL HIGH (ref 20.0–24.0)
DELIVERY SYSTEMS: POSITIVE
Drawn by: 437071
Expiratory PAP: 5
FIO2: 0.3 %
Inspiratory PAP: 10
O2 Saturation: 98.5 %
Patient temperature: 97.7
TCO2: 42.6 mmol/L (ref 0–100)
pCO2 arterial: 60.5 mmHg (ref 35.0–45.0)
pH, Arterial: 7.44 (ref 7.350–7.450)
pO2, Arterial: 89.3 mmHg (ref 80.0–100.0)

## 2014-06-26 LAB — PROTIME-INR
INR: 1.12 (ref 0.00–1.49)
INR: 1.16 (ref 0.00–1.49)
PROTHROMBIN TIME: 15 s (ref 11.6–15.2)
Prothrombin Time: 14.5 seconds (ref 11.6–15.2)

## 2014-06-26 LAB — MRSA PCR SCREENING: MRSA by PCR: NEGATIVE

## 2014-06-26 LAB — RETICULOCYTES
RBC.: 2.68 MIL/uL — ABNORMAL LOW (ref 4.22–5.81)
Retic Count, Absolute: 67 10*3/uL (ref 19.0–186.0)
Retic Ct Pct: 2.5 % (ref 0.4–3.1)

## 2014-06-26 LAB — LACTIC ACID, PLASMA: Lactic Acid, Venous: 1.5 mmol/L (ref 0.5–2.0)

## 2014-06-26 LAB — IRON AND TIBC
Iron: 78 ug/dL (ref 45–182)
Saturation Ratios: 26 % (ref 17.9–39.5)
TIBC: 304 ug/dL (ref 250–450)
UIBC: 226 ug/dL

## 2014-06-26 LAB — TROPONIN I: Troponin I: <0.03 ng/mL (ref <0.031)

## 2014-06-26 LAB — BRAIN NATRIURETIC PEPTIDE: B Natriuretic Peptide: 313 pg/mL — ABNORMAL HIGH (ref 0.0–100.0)

## 2014-06-26 LAB — MAGNESIUM: MAGNESIUM: 1.9 mg/dL (ref 1.7–2.4)

## 2014-06-26 MED ORDER — METHYLPREDNISOLONE SODIUM SUCC 125 MG IJ SOLR
60.0000 mg | Freq: Every day | INTRAMUSCULAR | Status: DC
  Administered 2014-06-26: 60 mg via INTRAVENOUS
  Filled 2014-06-26: qty 0.96
  Filled 2014-06-26: qty 2

## 2014-06-26 MED ORDER — CEFTRIAXONE SODIUM IN DEXTROSE 20 MG/ML IV SOLN
1.0000 g | INTRAVENOUS | Status: DC
  Administered 2014-06-26 – 2014-06-28 (×3): 1 g via INTRAVENOUS
  Filled 2014-06-26 (×4): qty 50

## 2014-06-26 MED ORDER — TRAMADOL HCL 50 MG PO TABS
50.0000 mg | ORAL_TABLET | Freq: Once | ORAL | Status: AC
  Administered 2014-06-26: 50 mg via ORAL
  Filled 2014-06-26: qty 1

## 2014-06-26 MED ORDER — HYDRALAZINE HCL 20 MG/ML IJ SOLN
10.0000 mg | Freq: Four times a day (QID) | INTRAMUSCULAR | Status: DC | PRN
  Administered 2014-06-26: 10 mg via INTRAVENOUS
  Filled 2014-06-26: qty 1

## 2014-06-26 MED ORDER — GI COCKTAIL ~~LOC~~
30.0000 mL | Freq: Once | ORAL | Status: AC
  Administered 2014-06-26: 30 mL via ORAL
  Filled 2014-06-26: qty 30

## 2014-06-26 MED ORDER — DEXTROSE 5 % IV SOLN
INTRAVENOUS | Status: DC
  Administered 2014-06-26 (×2): via INTRAVENOUS

## 2014-06-26 MED ORDER — DEXTROSE 5 % IV SOLN
500.0000 mg | INTRAVENOUS | Status: DC
  Administered 2014-06-26 – 2014-06-28 (×3): 500 mg via INTRAVENOUS
  Filled 2014-06-26 (×4): qty 500

## 2014-06-26 MED ORDER — HALOPERIDOL LACTATE 5 MG/ML IJ SOLN
INTRAMUSCULAR | Status: AC
  Filled 2014-06-26: qty 1

## 2014-06-26 MED ORDER — FAMOTIDINE IN NACL 20-0.9 MG/50ML-% IV SOLN
20.0000 mg | Freq: Once | INTRAVENOUS | Status: AC
  Administered 2014-06-26: 20 mg via INTRAVENOUS
  Filled 2014-06-26: qty 50

## 2014-06-26 NOTE — Progress Notes (Signed)
Have tried to speak w pt x2. Pt resting. No fam in room. Has o2 w [facility]. Chart states has hhc but unable to determine which agency. Cm will cont to follow.

## 2014-06-26 NOTE — Progress Notes (Signed)
Took patient of BIPAP per MD, following his blood gas, and placed him on 4L nasal cannula. Patient's vitals are remaining stable and no increased work of breathing.

## 2014-06-26 NOTE — Care Management Note (Signed)
Case Management Note  Patient Details  Name: Jonanthan Bolender MRN: [id number] Date of Birth: Jun 18, 1930  Subjective/Objective:    Adm w resp failure                Action/Plan: lives w wife, pcp dr [name]   Expected Discharge Date:                  Expected Discharge Plan:     In-House Referral:     Discharge planning Services     Post Acute Care Choice:    Choice offered to:     DME Arranged:    DME Agency:     HH Arranged:    [place] Agency:     Status of Service:     Medicare Important Message Given:    Date Medicare IM Given:    Medicare IM give by:    Date Additional Medicare IM Given:    Additional Medicare Important Message give by:     If discussed at [place] of Stay Meetings, dates discussed:    Additional Comments: ur review done.  Lacretia Leigh, RN 06/26/2014, 8:00 AM

## 2014-06-26 NOTE — Progress Notes (Signed)
Chase Johnson JQB:[id number] DOB: 1930-09-03 DOA: 06/25/2014 PCP: [name], PA-C  Brief narrative:  81 y/o ? htn, hld, CAD on plavix previosuly, PVD foll Dr. Nila Nephew showed bil;aterally occluded SFA's 12/2012,CKD c  Estimated Creatinine Clearance: 40.3 mL/min (by C-G formula based on Cr of 1.25).~ CKD stage 3,  2 hospitalizations ~ 1 month 5/30-->06/04/14 c PNA and COPD exacerb and also 06/11/14-->06/13/14 His wife lives at a nursing facility since 2012 [name] the cause of memory issues.  At baseline he typically ambulates with a walker and has had 2 falls in the recent month and a half He does not think that he has any problem with cough or cold after eating He states that he has had dark stools-"since been on all these medications" He tells me that he's been on oxygen since May of 2015?   Past medical history-As per Problem list Chart reviewed as below- reviewed  Consultants:   none  Procedures:  none  Antibiotics:  Ceftriaxone  Azithro   Subjective   Alert oriented and aware of what's going on. No cp/n/v/ Felt SOB last pm and came to hosptial Can name hosp/city and county Hungry and wants to eat   Objective    Interim History:   Telemetry: PVC   Objective: Filed Vitals:   06/26/14 0400 06/26/14 0421 06/26/14 0500 06/26/14 0700  BP: 146/79  159/60 148/55  Pulse: 76  71 77  Temp:      TempSrc:      Resp: 14  14 14   Height:      Weight:   62.5 kg (137 lb 12.6 oz)   SpO2: 100% 100% 100% 88%    Intake/Output Summary (Last 24 hours) at 06/26/14 0744 Last data filed at 06/26/14 0400  Gross per 24 hour  Intake    300 ml  Output    375 ml  Net    -75 ml    Exam:  General: aletrt pleasant, arcus senilis Cardiovascular: s1 s 2no m/r/g  Reg rr Respiratory: clea rno added sound Abdomen: soft nt nd no rebound no guard-rectal deferred Skin no LE edema- of stasis dermatitis Neuro intact  Data Reviewed: Basic Metabolic  Panel:  Recent Labs Lab 06/25/14 1912 06/26/14 0150  NA 146* 148*  K 4.8 4.8  CL 99* 96*  CO2 43* 41*  GLUCOSE 130* 109*  BUN 17 21*  CREATININE 1.33* 1.25*  CALCIUM 9.0 9.5  MG  --  1.9   Liver Function Tests:  Recent Labs Lab 06/25/14 1912 06/26/14 0150  AST 156* 85*  ALT 116* 112*  ALKPHOS 150* 156*  BILITOT 0.9 0.8  PROT 6.8 6.2*  ALBUMIN 3.8 3.8   No results for input(s): LIPASE, AMYLASE in the last 168 hours. No results for input(s): AMMONIA in the last 168 hours. CBC:  Recent Labs Lab 06/25/14 1912 06/26/14 0150 06/26/14 0708  WBC 14.4* 13.5* 12.5*  NEUTROABS 13.6* 12.7* 11.9*  HGB 7.7* 7.5* 7.3*  HCT 25.4* 24.8* 23.5*  MCV 94.4 92.5 91.8  PLT 126* 89* PENDING   Cardiac Enzymes:  Recent Labs Lab 06/25/14 1912 06/26/14 0150  TROPONINI 0.03 <0.03   BNP: Invalid input(s): POCBNP CBG: No results for input(s): GLUCAP in the last 168 hours.  Recent Results (from the past 240 hour(s))  MRSA PCR Screening     Status: None   Collection Time: 06/25/14 11:48 PM  Result Value Ref Range Status   MRSA by PCR NEGATIVE NEGATIVE Final    Comment:  The GeneXpert MRSA Assay (FDA approved for NASAL specimens only), is one component of a comprehensive MRSA colonization surveillance program. It is not intended to diagnose MRSA infection nor to guide or monitor treatment for MRSA infections.      Studies:              All Imaging reviewed and is as per above notation   Scheduled Meds: . amLODipine  10 mg Oral Daily  . atorvastatin  40 mg Oral Daily  . azithromycin  500 mg Intravenous Q24H  . budesonide-formoterol  2 puff Inhalation BID  . cefTRIAXone (ROCEPHIN)  IV  1 g Intravenous Q24H  . clopidogrel  75 mg Oral Daily  . donepezil  5 mg Oral QHS  . ipratropium-albuterol  3 mL Nebulization Q4H  . methylPREDNISolone (SOLU-MEDROL) injection  60 mg Intravenous QHS  . metoprolol tartrate  37.5 mg Oral BID  . QUEtiapine  200 mg Oral QPM  .  sodium chloride  3 mL Intravenous Q12H  . tamsulosin  0.4 mg Oral Daily   Continuous Infusions:    Assessment/Plan: Present on Admission:  . Acute respiratory failure with hypoxia and hypercarbia-etiology unclear. On admission pH 7.29, PCO2 98 but probably multifactorial-anemia blood loss , COPD, aspiration-see below  Probable GI bleed -Recheck CBC plus differential in a.m. if noted grossly Hemoccult-positive will consult GI more urgently however at this stage as he is not actively bleeding w . CAD (coronary artery disease)-for now feel appropriate to continue Plavix. Can consider more beta selective beta blocker 37.5 metoprolol twice a day for now  . Hyperosmolality and hypernatremia probably secondary to volume depletion -we will gently volume replete him with D5 @75  cc/HR 24 hours and see how he does  . COPD exacerbation-no wheeze no rales no rhonchi  . HTN (hypertension)-continue amlodipine 10 twice a day [unusual dose]  . Peripheral arterial disease-balance risk and benefit of Plavix in this situation  . Fall-multiple falls noted over the past month . We will get therapy to evaluate the patient . Patient has been recommended skilled nursing placement in the past  . AKI (acute kidney injury) . Fecal occult blood test positive-he is been noted to have Hemoccult-positive in the past. By strict guidelines he may not be a candidate for colonoscopy.  . Hyperlipidemia-continue atorvastatin 40 daily  . 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 Continue quetiapine 200 every afternoon ,   Code Status: DO NOT RESUSCITATE Famil communication: None present at the bedside at present Disposition Plan: Keep patient on step down today probable transfer to telemetry in 24 hours   Verneita Griffes, MD  [facility] Pager [phone] 06/26/2014, 7:44 AM    LOS: 1 day

## 2014-06-27 LAB — COMPREHENSIVE METABOLIC PANEL
ALBUMIN: 3.1 g/dL — AB (ref 3.5–5.0)
ALT: 63 U/L (ref 17–63)
AST: 21 U/L (ref 15–41)
Alkaline Phosphatase: 107 U/L (ref 38–126)
Anion gap: 7 (ref 5–15)
BUN: 31 mg/dL — ABNORMAL HIGH (ref 6–20)
CHLORIDE: 95 mmol/L — AB (ref 101–111)
CO2: 37 mmol/L — AB (ref 22–32)
Calcium: 8.2 mg/dL — ABNORMAL LOW (ref 8.9–10.3)
Creatinine, Ser: 1.69 mg/dL — ABNORMAL HIGH (ref 0.61–1.24)
GFR, EST AFRICAN AMERICAN: 42 mL/min — AB (ref >60)
GFR, EST NON AFRICAN AMERICAN: 36 mL/min — AB (ref >60)
Glucose, Bld: 136 mg/dL — ABNORMAL HIGH (ref 65–99)
Potassium: 4 mmol/L (ref 3.5–5.1)
Sodium: 139 mmol/L (ref 135–145)
Total Bilirubin: 0.1 mg/dL — ABNORMAL LOW (ref 0.3–1.2)
Total Protein: 5.5 g/dL — ABNORMAL LOW (ref 6.5–8.1)

## 2014-06-27 LAB — CBC
HCT: 22 % — ABNORMAL LOW (ref 39.0–52.0)
HEMATOCRIT: 30.1 % — AB (ref 39.0–52.0)
Hemoglobin: 6.9 g/dL — CL (ref 13.0–17.0)
Hemoglobin: 9.4 g/dL — ABNORMAL LOW (ref 13.0–17.0)
MCH: 28.8 pg (ref 26.0–34.0)
MCH: 28 pg (ref 26.0–34.0)
MCHC: 31.4 g/dL (ref 30.0–36.0)
MCHC: 31.2 g/dL (ref 30.0–36.0)
MCV: 91.7 fL (ref 78.0–100.0)
MCV: 89.6 fL (ref 78.0–100.0)
Platelets: 92 10*3/uL — ABNORMAL LOW (ref 150–400)
Platelets: 144 10*3/uL — ABNORMAL LOW (ref 150–400)
RBC: 2.4 MIL/uL — ABNORMAL LOW (ref 4.22–5.81)
RBC: 3.36 MIL/uL — ABNORMAL LOW (ref 4.22–5.81)
RDW: 15.3 % (ref 11.5–15.5)
RDW: 17.3 % — ABNORMAL HIGH (ref 11.5–15.5)
WBC: 8.7 10*3/uL (ref 4.0–10.5)
WBC: 12.2 10*3/uL — ABNORMAL HIGH (ref 4.0–10.5)

## 2014-06-27 LAB — PROTIME-INR
INR: 1.13 (ref 0.00–1.49)
Prothrombin Time: 14.7 seconds (ref 11.6–15.2)

## 2014-06-27 LAB — OCCULT BLOOD X 1 CARD TO LAB, STOOL: Fecal Occult Bld: NEGATIVE

## 2014-06-27 LAB — PREPARE RBC (CROSSMATCH)

## 2014-06-27 MED ORDER — DEXTROSE-NACL 5-0.9 % IV SOLN
INTRAVENOUS | Status: DC
  Administered 2014-06-27: 12:00:00 via INTRAVENOUS

## 2014-06-27 MED ORDER — SODIUM CHLORIDE 0.9 % IV SOLN
INTRAVENOUS | Status: DC
  Administered 2014-06-28: 07:00:00 via INTRAVENOUS

## 2014-06-27 MED ORDER — ACETAMINOPHEN 325 MG PO TABS
650.0000 mg | ORAL_TABLET | Freq: Four times a day (QID) | ORAL | Status: DC | PRN
  Administered 2014-06-27 – 2014-07-02 (×7): 650 mg via ORAL
  Filled 2014-06-27 (×7): qty 2

## 2014-06-27 MED ORDER — SODIUM CHLORIDE 0.9 % IV SOLN
Freq: Once | INTRAVENOUS | Status: AC
  Administered 2014-06-27: 06:00:00 via INTRAVENOUS

## 2014-06-27 MED ORDER — IPRATROPIUM-ALBUTEROL 0.5-2.5 (3) MG/3ML IN SOLN
3.0000 mL | Freq: Three times a day (TID) | RESPIRATORY_TRACT | Status: DC
  Administered 2014-06-27 – 2014-07-03 (×18): 3 mL via RESPIRATORY_TRACT
  Filled 2014-06-27 (×18): qty 3

## 2014-06-27 NOTE — Progress Notes (Signed)
CRITICAL VALUE ALERT  Critical value received:  HGB 6.9    Date of notification:  06/27/2014  Time of notification:  0330  Critical value read back:Yes.    Nurse who received alert:  Rosebud Poles RN  MD notified (1st page):  Fredirick Maudlin   Time of first page:  [phone]  Responding MD:  Fredirick Maudlin  Time MD responded:  [phone]

## 2014-06-27 NOTE — Evaluation (Signed)
Clinical/Bedside Swallow Evaluation Patient Details  Name: Chase Johnson MRN: [id number] Date of Birth: [birth year]  Today's Date: 06/27/2014 Time: SLP Start Time (ACUTE ONLY): 1015 SLP Stop Time (ACUTE ONLY): 1045 SLP Time Calculation (min) (ACUTE ONLY): 30 min  Past Medical History:  Past Medical History  Diagnosis Date  . CKD (chronic kidney disease)   . CAD (coronary artery disease)   . HTN (hypertension)   . PTSD (post-traumatic stress disorder)   . Tobacco abuse   . Hyperlipidemia   . COPD (chronic obstructive pulmonary disease)   . Peripheral arterial disease   . BPH (benign prostatic hyperplasia)    Past Surgical History:  Past Surgical History  Procedure Laterality Date  . Prostate surgery      for BPH, but not know the detail by pt   HPI:  Pt is a 81 y.o. male with past medical history of coronary artery disease, COPD, hypertension, peripheral vascular disease, & dyslipidemia. Presented to the hosptial after a fall.  CT of head negative for acute intracranial processes with some chronic small vessel ischemic changes. Small left pleural effusion noted with a x-ray of unilateral ribs and right chest view.    Assessment / Plan / Recommendation Clinical Impression  Pts swallow appears within functional limits. No overt signs or symtpoms of aspiration with any consistencies observed including puree, regular, and thin liquid consistencies. Pts repiratory status  noted to be fluctuating during eating dropping into the low 80s. Supplemental O2 replaced via nasal cannula at 2 liters as patient had previously self removed oxygen. Pt at mild risk for aspiration based upon observed poor mentation, impulsivity, and decreased respiratory status. Continue dysphagia 3 thin liquid diet. No further swallow intervention indicated at this time.     Aspiration Risk  Mild    Diet Recommendation Dysphagia 3 (Mech soft);Thin   Medication Administration: Whole meds with liquid    Other   Recommendations Oral Care Recommendations: Oral care BID   Follow Up Recommendations       Frequency and Duration        Pertinent Vitals/Pain Denies pain    SLP Swallow Goals     Swallow Study Prior Functional Status       General Other Pertinent Information: Pt is a 81 y.o. male with past medical history of coronary artery disease, COPD, hypertension, peripheral vascular disease, & dyslipidemia. Presented to the hosptial after a fall.  CT of head negative for acute intracranial processes with some chronic small vessel ischemic changes. Small left pleural effusion noted with a x-ray of unilateral ribs and right chest view.  Type of Study: Bedside swallow evaluation Diet Prior to this Study: Dysphagia 3 (soft);Thin liquids Temperature Spikes Noted: No Respiratory Status: Supplemental O2 delivered via (comment) History of Recent Intubation: No Behavior/Cognition: Confused;Agitated;Impulsive;Distractible;Requires cueing;Alert Oral Cavity - Dentition: Adequate natural dentition/normal for age Self-Feeding Abilities: Able to feed self Patient Positioning: Upright in bed Baseline Vocal Quality: Low vocal intensity Volitional Cough: Strong Volitional Swallow: Able to elicit    Oral/Motor/Sensory Function Overall Oral Motor/Sensory Function: Appears within functional limits for tasks assessed   Ice Chips Ice chips: Not tested   Thin Liquid Thin Liquid: Within functional limits    Nectar Thick Nectar Thick Liquid: Not tested   Honey Thick Honey Thick Liquid: Not tested   Puree Puree: Within functional limits   Solid   GO     Arvil Chaco MA, CCC-SLP Acute Care Speech Language Pathologist  Solid: Within functional limits  [place], Aiana Nordquist E 06/27/2014,10:51 AM

## 2014-06-27 NOTE — Progress Notes (Signed)
Unable to reach Pt's POA again. Pt is alert and oriented. Aware of what is going on. Able to state name, birth date, and year as 2014. Also able to state that COPD brought him in and that the president is Obama. Pt will sign consent form and has agreed to blood transfusion.

## 2014-06-27 NOTE — Progress Notes (Signed)
Pt resting well on nasal cannula, not in distress and does not wish to wear BiPAP at this time. RT informed pt to call for RT if he changes his mind during the night

## 2014-06-27 NOTE — Consult Note (Signed)
[facility] CONSULT Reason for consult: drop in hemoglobin Referring Physician: [facility]. Primary G.I.: none patient unassigned  Chase Johnson is an 81 y.o. male.  HPI: he was admitted a couple days ago after falling. Apparently has hypoxia and uses oxygen at home. Felt the possibly have pneumonia. As COPD was given Solu-Medrol emplaced on BiPAP. He is markedly anemic has had a drop in hemoglobin since been in the hospital but only one stool check it was hemoglobin negative. The patient has been complaining of epigastric pain which he says he's had for 20 years. He is unable to tell me if he is ever had EGD or colonoscopy. Cannot find any record of any G.I. evaluation. Hemoglobin 6.9 was 8.7 month ago during an ER visit. Iron panel a few days ago was normal. Saturation was 24%. The patient is on clopidogrel at home for heart issues and denies taking pain medicines but states that he continues to have epigastric pain. According to his medication list he is not on any home proton pump inhibitor H2 blocker. Were asked to see him about his continuing drop in hemoglobin.  Past Medical History  Diagnosis Date  . CKD (chronic kidney disease)   . CAD (coronary artery disease)   . HTN (hypertension)   . PTSD (post-traumatic stress disorder)   . Tobacco abuse   . Hyperlipidemia   . COPD (chronic obstructive pulmonary disease)   . Peripheral arterial disease   . BPH (benign prostatic hyperplasia)     Past Surgical History  Procedure Laterality Date  . Prostate surgery      for BPH, but not know the detail by pt    Family History  Problem Relation Age of Onset  . Heart attack Father   . Hyperlipidemia Father   . Hypertension Father   . Stroke Father     Social History:  reports that he quit smoking about 18 months ago. His smoking use included Cigarettes. He does not have any smokeless tobacco history on file. He reports that he does not drink alcohol. His drug history is  not on file.  Allergies: No Known Allergies  Medications; Prior to Admission medications   Medication Sig Start Date End Date Taking? Authorizing Provider  albuterol (PROVENTIL) (2.5 MG/3ML) 0.083% nebulizer solution Take 3 mLs (2.5 mg total) by nebulization every 4 (four) hours as needed for wheezing or shortness of breath. 06/04/14  Yes Robbie Lis, MD  amLODipine (NORVASC) 10 MG tablet Take 10 mg by mouth daily. 05/20/14  Yes Historical Provider, MD  atorvastatin (LIPITOR) 40 MG tablet Take 40 mg by mouth daily. 05/20/14  Yes Historical Provider, MD  budesonide-formoterol (SYMBICORT) 160-4.5 MCG/ACT inhaler Inhale 2 puffs into the lungs 2 (two) times daily.   Yes Historical Provider, MD  clopidogrel (PLAVIX) 75 MG tablet Take 75 mg by mouth daily. 05/20/14  Yes Historical Provider, MD  dextromethorphan-guaiFENesin (MUCINEX DM) 30-600 MG per 12 hr tablet Take 1 tablet by mouth 2 (two) times daily. 06/13/14  Yes Hosie Poisson, MD  donepezil (ARICEPT) 5 MG tablet Take 5 mg by mouth at bedtime. 05/20/14  Yes Historical Provider, MD  furosemide (LASIX) 20 MG tablet Take 20 mg by mouth See admin instructions. Tues, Thurs, Sat, Sun   Yes Historical Provider, MD  haloperidol (HALDOL) 5 MG tablet Take 5-10 mg by mouth every 6 (six) hours as needed for agitation.  05/27/14  Yes Historical Provider, MD  ipratropium-albuterol (DUONEB) 0.5-2.5 (3) MG/3ML SOLN Take 3 mLs by nebulization  every 6 (six) hours as needed. Patient taking differently: Take 3 mLs by nebulization every 6 (six) hours as needed (shortness of breath).  06/13/14  Yes Hosie Poisson, MD  metoprolol tartrate (LOPRESSOR) 25 MG tablet Take 37.5 mg by mouth 2 (two) times daily. 05/20/14  Yes Historical Provider, MD  nitroGLYCERIN (NITROSTAT) 0.4 MG SL tablet Place 0.4 mg under the tongue every 5 (five) minutes as needed for chest pain.   Yes Historical Provider, MD  predniSONE (DELTASONE) 20 MG tablet Take 3 tablets (60 mg total) by mouth daily.  06/17/14  Yes Linton Flemings, MD  PROAIR HFA [phone] BASE) MCG/ACT inhaler Inhale 2 puffs into the lungs every 6 (six) hours as needed for wheezing.  05/20/14  Yes Historical Provider, MD  SEROQUEL XR 200 MG 24 hr tablet Take 200 mg by mouth every evening.  05/26/14  Yes Historical Provider, MD  tamsulosin (FLOMAX) 0.4 MG CAPS capsule Take 0.4 mg by mouth daily.    Yes Historical Provider, MD   . amLODipine  10 mg Oral Daily  . atorvastatin  40 mg Oral Daily  . azithromycin  500 mg Intravenous Q24H  . budesonide-formoterol  2 puff Inhalation BID  . cefTRIAXone (ROCEPHIN)  IV  1 g Intravenous Q24H  . donepezil  5 mg Oral QHS  . ipratropium-albuterol  3 mL Nebulization TID  . metoprolol tartrate  37.5 mg Oral BID  . QUEtiapine  200 mg Oral QPM  . sodium chloride  3 mL Intravenous Q12H  . tamsulosin  0.4 mg Oral Daily   PRN Meds haloperidol, hydrALAZINE, ondansetron **OR** ondansetron (ZOFRAN) IV Results for orders placed or performed during the hospital encounter of 06/25/14 (from the past 48 hour(s))  Urinalysis, Routine w reflex microscopic (not at [facility])     Status: Abnormal   Collection Time: 06/25/14  5:20 PM  Result Value Ref Range   Color, Urine YELLOW YELLOW   APPearance CLEAR CLEAR   Specific Gravity, Urine 1.015 1.005 - 1.030   pH 5.5 5.0 - 8.0   Glucose, UA NEGATIVE NEGATIVE mg/dL   Hgb urine dipstick NEGATIVE NEGATIVE   Bilirubin Urine NEGATIVE NEGATIVE   Ketones, ur NEGATIVE NEGATIVE mg/dL   Protein, ur 30 (A) NEGATIVE mg/dL   Urobilinogen, UA 0.2 0.0 - 1.0 mg/dL   Nitrite NEGATIVE NEGATIVE   Leukocytes, UA NEGATIVE NEGATIVE  Urine microscopic-add on     Status: Abnormal   Collection Time: 06/25/14  5:20 PM  Result Value Ref Range   Squamous Epithelial / LPF RARE RARE   RBC / HPF 0-2 <3 RBC/hpf   Bacteria, UA RARE RARE   Casts HYALINE CASTS (A) NEGATIVE  Urine rapid drug screen (hosp performed)     Status: None   Collection Time: 06/25/14  5:20 PM  Result Value Ref  Range   Opiates NONE DETECTED NONE DETECTED   Cocaine NONE DETECTED NONE DETECTED   Benzodiazepines NONE DETECTED NONE DETECTED   Amphetamines NONE DETECTED NONE DETECTED   Tetrahydrocannabinol NONE DETECTED NONE DETECTED   Barbiturates NONE DETECTED NONE DETECTED    Comment:        DRUG SCREEN FOR MEDICAL PURPOSES ONLY.  IF CONFIRMATION IS NEEDED FOR ANY PURPOSE, NOTIFY LAB WITHIN 5 DAYS.        LOWEST DETECTABLE LIMITS FOR URINE DRUG SCREEN Drug Class       Cutoff (ng/mL) Amphetamine      1000 Barbiturate      200 Benzodiazepine   518 Tricyclics  300 Opiates          300 Cocaine          300 THC              50   Comprehensive metabolic panel     Status: Abnormal   Collection Time: 06/25/14  7:12 PM  Result Value Ref Range   Sodium 146 (H) 135 - 145 mmol/L   Potassium 4.8 3.5 - 5.1 mmol/L   Chloride 99 (L) 101 - 111 mmol/L   CO2 43 (H) 22 - 32 mmol/L   Glucose, Bld 130 (H) 65 - 99 mg/dL   BUN 17 6 - 20 mg/dL   Creatinine, Ser 1.33 (H) 0.61 - 1.24 mg/dL   Calcium 9.0 8.9 - 10.3 mg/dL   Total Protein 6.8 6.5 - 8.1 g/dL   Albumin 3.8 3.5 - 5.0 g/dL   AST 156 (H) 15 - 41 U/L   ALT 116 (H) 17 - 63 U/L   Alkaline Phosphatase 150 (H) 38 - 126 U/L   Total Bilirubin 0.9 0.3 - 1.2 mg/dL   GFR calc non Af Amer 48 (L) >60 mL/min   GFR calc Af Amer 56 (L) >60 mL/min    Comment: (NOTE) The eGFR has been calculated using the CKD EPI equation. This calculation has not been validated in all clinical situations. eGFR's persistently <60 mL/min signify possible Chronic Kidney Disease.    Anion gap 4 (L) 5 - 15  Ethanol     Status: None   Collection Time: 06/25/14  7:12 PM  Result Value Ref Range   Alcohol, Ethyl (B) <5 <5 mg/dL    Comment:        LOWEST DETECTABLE LIMIT FOR SERUM ALCOHOL IS 5 mg/dL FOR MEDICAL PURPOSES ONLY   CBC with Differential     Status: Abnormal   Collection Time: 06/25/14  7:12 PM  Result Value Ref Range   WBC 14.4 (H) 4.0 - 10.5 K/uL   RBC  2.69 (L) 4.22 - 5.81 MIL/uL   Hemoglobin 7.7 (L) 13.0 - 17.0 g/dL   HCT 25.4 (L) 39.0 - 52.0 %   MCV 94.4 78.0 - 100.0 fL   MCH 28.6 26.0 - 34.0 pg   MCHC 30.3 30.0 - 36.0 g/dL   RDW 15.4 11.5 - 15.5 %   Platelets 126 (L) 150 - 400 K/uL    Comment: REPEATED TO VERIFY SPECIMEN CHECKED FOR CLOTS PLATELET COUNT CONFIRMED BY SMEAR    Neutrophils Relative % 94 (H) 43 - 77 %   Neutro Abs 13.6 (H) 1.7 - 7.7 K/uL   Lymphocytes Relative 3 (L) 12 - 46 %   Lymphs Abs 0.4 (L) 0.7 - 4.0 K/uL   Monocytes Relative 3 3 - 12 %   Monocytes Absolute 0.5 0.1 - 1.0 K/uL   Eosinophils Relative 0 0 - 5 %   Eosinophils Absolute 0.0 0.0 - 0.7 K/uL   Basophils Relative 0 0 - 1 %   Basophils Absolute 0.0 0.0 - 0.1 K/uL  Troponin I     Status: None   Collection Time: 06/25/14  7:12 PM  Result Value Ref Range   Troponin I 0.03 <0.031 ng/mL    Comment:        NO INDICATION OF MYOCARDIAL INJURY.   I-Stat arterial blood gas, ED     Status: Abnormal   Collection Time: 06/25/14  9:36 PM  Result Value Ref Range   pH, Arterial 7.290 (L) 7.350 - 7.450  pCO2 arterial 98.2 (HH) 35.0 - 45.0 mmHg   pO2, Arterial 212.0 (H) 80.0 - 100.0 mmHg   Bicarbonate 47.1 (H) 20.0 - 24.0 mEq/L   TCO2 >50 0 - 100 mmol/L   O2 Saturation 100.0 %   Acid-Base Excess 17.0 (H) 0.0 - 2.0 mmol/L   Patient temperature 98.8 F    Collection site RADIAL, ALLEN'S TEST ACCEPTABLE    Drawn by Operator    Sample type ARTERIAL    Comment NOTIFIED PHYSICIAN   MRSA PCR Screening     Status: None   Collection Time: 06/25/14 11:48 PM  Result Value Ref Range   MRSA by PCR NEGATIVE NEGATIVE    Comment:        The GeneXpert MRSA Assay (FDA approved for NASAL specimens only), is one component of a comprehensive MRSA colonization surveillance program. It is not intended to diagnose MRSA infection nor to guide or monitor treatment for MRSA infections.   Blood gas, arterial     Status: Abnormal   Collection Time: 06/26/14  1:29 AM   Result Value Ref Range   FIO2 0.30 %   Delivery systems BILEVEL POSITIVE AIRWAY PRESSURE    Inspiratory PAP 10    Expiratory PAP 5    pH, Arterial 7.440 7.350 - 7.450    Comment: CORRECTED ON 06/24 AT 1453: PREVIOUSLY REPORTED AS 7.433   pCO2 arterial 60.5 (HH) 35.0 - 45.0 mmHg    Comment: CRITICAL RESULT CALLED TO, READ BACK BY AND VERIFIED WITH: TARA TOMLINSON,RN AT 0139,BY TIM SNIDER RRT,RCP ON 06/26/14 CORRECTED ON 06/24 AT 1453: PREVIOUSLY REPORTED AS 61.9 CRITICAL RESULT CALLED TO, READ BACK BY AND VERIFIED WITH: TARA TOMLINSON,RN AT 0139,BY TIM SNIDER RRT,RCP ON 06/26/14    pO2, Arterial 89.3 80.0 - 100.0 mmHg    Comment: CORRECTED ON 06/24 AT 1453: PREVIOUSLY REPORTED AS 91.7   Bicarbonate 40.7 (H) 20.0 - 24.0 mEq/L   TCO2 42.6 0 - 100 mmol/L   Acid-Base Excess 15.4 (H) 0.0 - 2.0 mmol/L   O2 Saturation 98.5 %   Patient temperature 97.7     Comment: CORRECTED ON 06/24 AT 1339: PREVIOUSLY REPORTED AS 98.6   Collection site RIGHT RADIAL    Drawn by [id number]    Sample type ARTERIAL DRAW    Allens test (pass/fail) PASS PASS  Comprehensive metabolic panel     Status: Abnormal   Collection Time: 06/26/14  1:50 AM  Result Value Ref Range   Sodium 148 (H) 135 - 145 mmol/L   Potassium 4.8 3.5 - 5.1 mmol/L   Chloride 96 (L) 101 - 111 mmol/L   CO2 41 (H) 22 - 32 mmol/L   Glucose, Bld 109 (H) 65 - 99 mg/dL   BUN 21 (H) 6 - 20 mg/dL   Creatinine, Ser 1.25 (H) 0.61 - 1.24 mg/dL   Calcium 9.5 8.9 - 10.3 mg/dL   Total Protein 6.2 (L) 6.5 - 8.1 g/dL   Albumin 3.8 3.5 - 5.0 g/dL   AST 85 (H) 15 - 41 U/L   ALT 112 (H) 17 - 63 U/L   Alkaline Phosphatase 156 (H) 38 - 126 U/L   Total Bilirubin 0.8 0.3 - 1.2 mg/dL   GFR calc non Af Amer 52 (L) >60 mL/min   GFR calc Af Amer >60 >60 mL/min    Comment: (NOTE) The eGFR has been calculated using the CKD EPI equation. This calculation has not been validated in all clinical situations. eGFR's persistently <60 mL/min signify possible Chronic  Kidney Disease.    Anion gap 11 5 - 15  Magnesium     Status: None   Collection Time: 06/26/14  1:50 AM  Result Value Ref Range   Magnesium 1.9 1.7 - 2.4 mg/dL  CBC WITH DIFFERENTIAL     Status: Abnormal   Collection Time: 06/26/14  1:50 AM  Result Value Ref Range   WBC 13.5 (H) 4.0 - 10.5 K/uL   RBC 2.68 (L) 4.22 - 5.81 MIL/uL   Hemoglobin 7.5 (L) 13.0 - 17.0 g/dL   HCT 24.8 (L) 39.0 - 52.0 %   MCV 92.5 78.0 - 100.0 fL   MCH 28.0 26.0 - 34.0 pg   MCHC 30.2 30.0 - 36.0 g/dL   RDW 15.4 11.5 - 15.5 %   Platelets 89 (L) 150 - 400 K/uL    Comment: PLATELET COUNT CONFIRMED BY SMEAR   Neutrophils Relative % 94 (H) 43 - 77 %   Lymphocytes Relative 4 (L) 12 - 46 %   Monocytes Relative 2 (L) 3 - 12 %   Eosinophils Relative 0 0 - 5 %   Basophils Relative 0 0 - 1 %   Neutro Abs 12.7 (H) 1.7 - 7.7 K/uL   Lymphs Abs 0.5 (L) 0.7 - 4.0 K/uL   Monocytes Absolute 0.3 0.1 - 1.0 K/uL   Eosinophils Absolute 0.0 0.0 - 0.7 K/uL   Basophils Absolute 0.0 0.0 - 0.1 K/uL   RBC Morphology ELLIPTOCYTES   Protime-INR     Status: None   Collection Time: 06/26/14  1:50 AM  Result Value Ref Range   Prothrombin Time 14.5 11.6 - 15.2 seconds   INR 1.12 0.00 - 1.49  Troponin I     Status: None   Collection Time: 06/26/14  1:50 AM  Result Value Ref Range   Troponin I <0.03 <0.031 ng/mL    Comment:        NO INDICATION OF MYOCARDIAL INJURY.   Brain natriuretic peptide     Status: Abnormal   Collection Time: 06/26/14  1:50 AM  Result Value Ref Range   B Natriuretic Peptide 313.0 (H) 0.0 - 100.0 pg/mL  Lactic acid, plasma     Status: None   Collection Time: 06/26/14  1:50 AM  Result Value Ref Range   Lactic Acid, Venous 1.5 0.5 - 2.0 mmol/L  Reticulocytes     Status: Abnormal   Collection Time: 06/26/14  1:50 AM  Result Value Ref Range   Retic Ct Pct 2.5 0.4 - 3.1 %   RBC. 2.68 (L) 4.22 - 5.81 MIL/uL   Retic Count, Manual 67.0 19.0 - 186.0 K/uL  Type and screen     Status: None (Preliminary  result)   Collection Time: 06/26/14  1:50 AM  Result Value Ref Range   ABO/RH(D) O POS    Antibody Screen NEG    Sample Expiration 06/29/2014    Unit Number [id number]    Blood Component Type RED CELLS,LR    Unit division 00    Status of Unit ISSUED    Transfusion Status OK TO TRANSFUSE    Crossmatch Result Compatible   Iron and TIBC     Status: None   Collection Time: 06/26/14  1:50 AM  Result Value Ref Range   Iron 78 45 - 182 ug/dL   TIBC 304 250 - 450 ug/dL   Saturation Ratios 26 17.9 - 39.5 %   UIBC 226 ug/dL  CBC with Differential/Platelet     Status: Abnormal  Collection Time: 06/26/14  7:08 AM  Result Value Ref Range   WBC 12.5 (H) 4.0 - 10.5 K/uL   RBC 2.56 (L) 4.22 - 5.81 MIL/uL   Hemoglobin 7.3 (L) 13.0 - 17.0 g/dL   HCT 23.5 (L) 39.0 - 52.0 %   MCV 91.8 78.0 - 100.0 fL   MCH 28.5 26.0 - 34.0 pg   MCHC 31.1 30.0 - 36.0 g/dL   RDW 15.3 11.5 - 15.5 %   Platelets 98 (L) 150 - 400 K/uL    Comment: PLATELET COUNT CONFIRMED BY SMEAR   Neutrophils Relative % 95 (H) 43 - 77 %   Neutro Abs 11.9 (H) 1.7 - 7.7 K/uL   Lymphocytes Relative 3 (L) 12 - 46 %   Lymphs Abs 0.4 (L) 0.7 - 4.0 K/uL   Monocytes Relative 2 (L) 3 - 12 %   Monocytes Absolute 0.2 0.1 - 1.0 K/uL   Eosinophils Relative 0 0 - 5 %   Eosinophils Absolute 0.0 0.0 - 0.7 K/uL   Basophils Relative 0 0 - 1 %   Basophils Absolute 0.0 0.0 - 0.1 K/uL  Comprehensive metabolic panel     Status: Abnormal   Collection Time: 06/26/14  7:08 AM  Result Value Ref Range   Sodium 146 (H) 135 - 145 mmol/L   Potassium 4.7 3.5 - 5.1 mmol/L   Chloride 95 (L) 101 - 111 mmol/L   CO2 38 (H) 22 - 32 mmol/L   Glucose, Bld 119 (H) 65 - 99 mg/dL   BUN 22 (H) 6 - 20 mg/dL   Creatinine, Ser 1.25 (H) 0.61 - 1.24 mg/dL   Calcium 9.2 8.9 - 10.3 mg/dL   Total Protein 5.7 (L) 6.5 - 8.1 g/dL   Albumin 3.5 3.5 - 5.0 g/dL   AST 54 (H) 15 - 41 U/L   ALT 93 (H) 17 - 63 U/L   Alkaline Phosphatase 133 (H) 38 - 126 U/L   Total  Bilirubin 0.5 0.3 - 1.2 mg/dL   GFR calc non Af Amer 52 (L) >60 mL/min   GFR calc Af Amer >60 >60 mL/min    Comment: (NOTE) The eGFR has been calculated using the CKD EPI equation. This calculation has not been validated in all clinical situations. eGFR's persistently <60 mL/min signify possible Chronic Kidney Disease.    Anion gap 13 5 - 15  Protime-INR     Status: None   Collection Time: 06/26/14  7:08 AM  Result Value Ref Range   Prothrombin Time 15.0 11.6 - 15.2 seconds   INR 1.16 0.00 - 1.49  CBC     Status: Abnormal   Collection Time: 06/27/14  2:43 AM  Result Value Ref Range   WBC 8.7 4.0 - 10.5 K/uL   RBC 2.40 (L) 4.22 - 5.81 MIL/uL   Hemoglobin 6.9 (LL) 13.0 - 17.0 g/dL    Comment: REPEATED TO VERIFY CRITICAL RESULT CALLED TO, READ BACK BY AND VERIFIED WITH: T WOLF,RN6/25/16 0334 RHOLMES    HCT 22.0 (L) 39.0 - 52.0 %   MCV 91.7 78.0 - 100.0 fL   MCH 28.8 26.0 - 34.0 pg   MCHC 31.4 30.0 - 36.0 g/dL   RDW 15.3 11.5 - 15.5 %   Platelets 92 (L) 150 - 400 K/uL    Comment: REPEATED TO VERIFY PLATELET COUNT CONFIRMED BY SMEAR   Comprehensive metabolic panel     Status: Abnormal   Collection Time: 06/27/14  2:43 AM  Result Value Ref Range  Sodium 139 135 - 145 mmol/L    Comment: DELTA CHECK NOTED   Potassium 4.0 3.5 - 5.1 mmol/L   Chloride 95 (L) 101 - 111 mmol/L   CO2 37 (H) 22 - 32 mmol/L   Glucose, Bld 136 (H) 65 - 99 mg/dL   BUN 31 (H) 6 - 20 mg/dL   Creatinine, Ser 1.69 (H) 0.61 - 1.24 mg/dL   Calcium 8.2 (L) 8.9 - 10.3 mg/dL   Total Protein 5.5 (L) 6.5 - 8.1 g/dL   Albumin 3.1 (L) 3.5 - 5.0 g/dL   AST 21 15 - 41 U/L   ALT 63 17 - 63 U/L   Alkaline Phosphatase 107 38 - 126 U/L   Total Bilirubin 0.1 (L) 0.3 - 1.2 mg/dL   GFR calc non Af Amer 36 (L) >60 mL/min   GFR calc Af Amer 42 (L) >60 mL/min    Comment: (NOTE) The eGFR has been calculated using the CKD EPI equation. This calculation has not been validated in all clinical situations. eGFR's  persistently <60 mL/min signify possible Chronic Kidney Disease.    Anion gap 7 5 - 15  Protime-INR     Status: None   Collection Time: 06/27/14  2:43 AM  Result Value Ref Range   Prothrombin Time 14.7 11.6 - 15.2 seconds   INR 1.13 0.00 - 1.49  Prepare RBC     Status: None   Collection Time: 06/27/14  4:01 AM  Result Value Ref Range   Order Confirmation ORDER PROCESSED BY BLOOD BANK   Occult blood card to lab, stool RN will collect     Status: None   Collection Time: 06/27/14 10:55 AM  Result Value Ref Range   Fecal Occult Bld NEGATIVE NEGATIVE    Dg Ribs Unilateral W/chest Right  06/25/2014   CLINICAL DATA:  The patient tripped today. Right chest pain. Initial encounter.  EXAM: RIGHT RIBS AND CHEST - 3+ VIEW  COMPARISON:  None.  FINDINGS: No fracture or other bone lesions are seen involving the ribs. There is no evidence of pneumothorax. Very small left effusion is noted. Both lungs are clear. Heart size and mediastinal contours are within normal limits.  IMPRESSION: Negative for fracture.  Small left pleural effusion.   Electronically Signed   By: Inge Rise M.D.   On: 06/25/2014 18:18   Dg Lumbar Spine Complete  06/25/2014   CLINICAL DATA:  Acute lower back pain after fall today. Initial encounter.  EXAM: LUMBAR SPINE - COMPLETE 4+ VIEW  COMPARISON:  None.  FINDINGS: No fracture or spondylolisthesis is noted. Moderate degenerative disc disease is noted at L3-4, L4-5 and L5-S1 with anterior osteophyte formation at these levels. Degenerative joint disease of posterior facet joints of lower lumbar spine are noted.  IMPRESSION: Multilevel degenerative disc disease. No acute abnormality seen in the lumbar spine.   Electronically Signed   By: Marijo Conception, M.D.   On: 06/25/2014 19:33   Ct Head Wo Contrast  06/25/2014   CLINICAL DATA:  Patient with recent fall and confusion. Patient fell through the door.  EXAM: CT HEAD WITHOUT CONTRAST  TECHNIQUE: Contiguous axial images were  obtained from the base of the skull through the vertex without intravenous contrast.  COMPARISON:  Brain CT 06/11/2014  FINDINGS: Ventricles and sulci are appropriate for patient's age. Mild periventricular and subcortical Garbers matter hypodensity compatible with chronic small vessel ischemic changes. No evidence for acute cortically based infarct, intracranial hemorrhage, mass lesion or mass effect. Mucosal  thickening within the ethmoid air cells. Status post sinus surgery. Mastoid air cells are well aerated. Calvarium is intact.  IMPRESSION: No acute intracranial process.  Chronic small vessel ischemic changes.   Electronically Signed   By: Lovey Newcomer M.D.   On: 06/25/2014 19:58              Blood pressure 145/48, pulse 70, temperature 98.2 F (36.8 C), temperature source Oral, resp. rate 18, height _0  (1.854 m), weight 64.592 kg (142 lb 6.4 oz), SpO2 97 %.  Physical exam:   General-- pleasant African-American male no distress, has oxygen by nasal cannula in place ENT-- mucous membranes moist nonicteric Neck-- lymphadenopathy Heart-- regular rate and rhythm without murmurs gallops Lungs-- diminish breast sound bilaterally Abdomen-- mild epigastric tenderness normal bowel sounds no masses palpated Psych-- alert and oriented. Asked appropriate questions about EGD   Assessment: 1. Anemia. One stool test is negative and design studies do not suggest chronic bleeding. He has had continued drop in hemoglobin and with the epigastric pain I think he should at least have EGD. He does have significant COPD I think we could perform this procedure safely with light sedation. Colonoscopy would be more risky and we may want to consider alternative such as virtual colonoscopy or barium enema 2. COPD on chronic OT therapy 3. PTSD on multiple medications 4. CAD, CKD, hypertension, peripheral vascular disease  Plan: we will tentatively plan EGD tomorrow with light sedation. We will likely use  the pediatric upper scope which will help [place] avoid excessive sedation. Don't feel that he would be a good candidate for colonoscopy. We will check additional stools for bleeding.   Kwadwo Taras JR,Dempsey Ahonen L 06/27/2014, 2:59 PM   Pager: [phone] If no answer or after hours call [phone]

## 2014-06-27 NOTE — Progress Notes (Signed)
Attempted to obtain consent for blood from Pt's POA. POA did not answer phone call. Left message to return call. Will continue to attempt to reach POA.

## 2014-06-27 NOTE — Progress Notes (Signed)
Transfer report received from [place] at 1250 and pt arrived to the unit via wheelchair with belongings to the side at 1315. Pt oriented to the unit and room; telemetry applied and verified; VSS; bed alarm on; pt IV intact and infusing unpon arrival to the unit; pt denies any pain; pt resting comfortably in bed with call light with in reach. Will closely monitor. Francis Gaines Whitley Strycharz RN.

## 2014-06-27 NOTE — Progress Notes (Signed)
Chase Johnson KZL:[id number] DOB: 1930-09-01 DOA: 06/25/2014 PCP: Chase Cowman, PA-C  Brief narrative:  81 y/o ? htn, hld, CAD on plavix previosuly, PVD foll Dr. Nila Johnson showed bil;aterally occluded SFA's 12/2012,CKD c  Estimated Creatinine Clearance: 30.8 mL/min (by C-G formula based on Cr of 1.69).~ CKD stage 3,  2 hospitalizations ~ 1 month 5/30-->06/04/14 c PNA and COPD exacerb and also 06/11/14-->06/13/14 His wife lives at a nursing facility since 2012 Chase Johnson the cause of memory issues.  At baseline he typically ambulates with a walker and has had 2 falls in the recent month and a half He does not think that he has any problem with cough or cold after eating He states that he has had dark stools-"since been on all these medications" He tells me that he's been on oxygen since May of 2015?   Past medical history-As per Problem list Chart reviewed as below- reviewed  Consultants:   none  Procedures:  none  Antibiotics:  Ceftriaxone  Azithro   Subjective   Some confusion this am Seems to be easily mid-directed Chase Johnson his HCPOA tells me he "sundowns" and they are looking at a memory unit in [place] for him    Objective    Interim History:   Telemetry: PVC   Objective: Filed Vitals:   06/27/14 0800 06/27/14 0830 06/27/14 0845 06/27/14 0857  BP: 159/60 161/60 156/54 135/88  Pulse: 65 72 77 76  Temp: 98.2 F (36.8 C)   98.3 F (36.8 C)  TempSrc: Oral   Oral  Resp: 11 15 14 14   Height:      Weight:      SpO2: 100% 100% 97% 97%    Intake/Output Summary (Last 24 hours) at 06/27/14 1051 Last data filed at 06/27/14 0857  Gross per 24 hour  Intake 2697.5 ml  Output    650 ml  Net 2047.5 ml    Exam:  General: pleasant, arcus senilis Cardiovascular: s1 s 2no m/r/g  Reg rr Respiratory: clear no added sound Abdomen: soft nt nd no rebound no guard-rectal showed no gross blood on glove Skin no LE edema- of stasis dermatitis Neuro  intact  Data Reviewed: Basic Metabolic Panel:  Recent Labs Lab 06/25/14 1912 06/26/14 0150 06/26/14 0708 06/27/14 0243  NA 146* 148* 146* 139  K 4.8 4.8 4.7 4.0  CL 99* 96* 95* 95*  CO2 43* 41* 38* 37*  GLUCOSE 130* 109* 119* 136*  BUN 17 21* 22* 31*  CREATININE 1.33* 1.25* 1.25* 1.69*  CALCIUM 9.0 9.5 9.2 8.2*  MG  --  1.9  --   --    Liver Function Tests:  Recent Labs Lab 06/25/14 1912 06/26/14 0150 06/26/14 0708 06/27/14 0243  AST 156* 85* 54* 21  ALT 116* 112* 93* 63  ALKPHOS 150* 156* 133* 107  BILITOT 0.9 0.8 0.5 0.1*  PROT 6.8 6.2* 5.7* 5.5*  ALBUMIN 3.8 3.8 3.5 3.1*   No results for input(s): LIPASE, AMYLASE in the last 168 hours. No results for input(s): AMMONIA in the last 168 hours. CBC:  Recent Labs Lab 06/25/14 1912 06/26/14 0150 06/26/14 0708 06/27/14 0243  WBC 14.4* 13.5* 12.5* 8.7  NEUTROABS 13.6* 12.7* 11.9*  --   HGB 7.7* 7.5* 7.3* 6.9*  HCT 25.4* 24.8* 23.5* 22.0*  MCV 94.4 92.5 91.8 91.7  PLT 126* 89* 98* 92*   Cardiac Enzymes:  Recent Labs Lab 06/25/14 1912 06/26/14 0150  TROPONINI 0.03 <0.03   BNP: Invalid input(s): POCBNP CBG: No results  for input(s): GLUCAP in the last 168 hours.  Recent Results (from the past 240 hour(s))  MRSA PCR Screening     Status: None   Collection Time: 06/25/14 11:48 PM  Result Value Ref Range Status   MRSA by PCR NEGATIVE NEGATIVE Final    Comment:        The GeneXpert MRSA Assay (FDA approved for NASAL specimens only), is one component of a comprehensive MRSA colonization surveillance program. It is not intended to diagnose MRSA infection nor to guide or monitor treatment for MRSA infections.      Studies:              All Imaging reviewed and is as per above notation   Scheduled Meds: . amLODipine  10 mg Oral Daily  . atorvastatin  40 mg Oral Daily  . azithromycin  500 mg Intravenous Q24H  . budesonide-formoterol  2 puff Inhalation BID  . cefTRIAXone (ROCEPHIN)  IV  1 g  Intravenous Q24H  . clopidogrel  75 mg Oral Daily  . donepezil  5 mg Oral QHS  . ipratropium-albuterol  3 mL Nebulization TID  . metoprolol tartrate  37.5 mg Oral BID  . QUEtiapine  200 mg Oral QPM  . sodium chloride  3 mL Intravenous Q12H  . tamsulosin  0.4 mg Oral Daily   Continuous Infusions: . dextrose Stopped (06/27/14 0600)     Assessment/Plan: Present on Admission:    Acute respiratory failure with hypoxia and hypercarbia-etiology unclear.  On admission pH 7.29, PCO2 98 but probably multifactorial-anemia blood loss , COPD, aspiration -Speech therapy saw patient-recommends dysphagia 3, thin liquids  Probable GI bleed  -states he "has had 3 colonoscopies in the past"-I cannot find any records of the same.   Hb dropped to 6.9 and tx 1 u prbc.   [facility] to see the patient in consult-Hemoccult performed and is pending   CAD (coronary artery disease) -held Plavix 6/25 given acute concern for bleed.   Might need Endoscopy?  Can consider more beta selective beta blocker 37.5 metoprolol twice a day for now    AKI (acute kidney injury) +Hyperosmolality and hypernatremia probably secondary to volume depletion -Creat slightly up as ? Non-specific indicator slow UGIB? -monitor labs in am.  continue saline 75 cc/hr - Estimated Creatinine Clearance: 30.8 mL/min (by C-G formula based on Cr of 1.69).   COPD exacerbation -no wheeze no rales no rhonchi   HTN (hypertension) -continue amlodipine 10 twice a day [unusual dose]    Peripheral arterial disease -balance risk and benefit of Plavix in this situation  As per above   Fall-multiple falls noted over the past month .  We will get therapy to evaluate the patient .  Patient has been recommended skilled nursing placement in the past    Fecal occult blood test positive in the past -see above discussion   Hyperlipidemia -continue atorvastatin 40 daily    PTSD (post-traumatic stress disorder) -prior [place] with  potential effects from this. Continue quetiapine 200 every afternoo  Code Status: DO NOT RESUSCITATE Famil communication:  Chase Johnson Neighbor [phone]  [phone]  D/w the patient's HCPOA as above Disposition Plan:transfer to telemetry in 24 hours   Chase Griffes, MD  [facility] Pager [phone] 06/27/2014, 10:51 AM    LOS: 2 days

## 2014-06-28 ENCOUNTER — Encounter (HOSPITAL_COMMUNITY)
Admission: EM | Disposition: A | Discharge status: Skilled Nursing Facility | Payer: Self-pay | Source: Home / Self Care | Attending: Family Medicine

## 2014-06-28 ENCOUNTER — Encounter (HOSPITAL_COMMUNITY): Payer: Self-pay

## 2014-06-28 HISTORY — PX: ESOPHAGOGASTRODUODENOSCOPY: SHX5428

## 2014-06-28 LAB — TYPE AND SCREEN
ABO/RH(D): O POS
Antibody Screen: NEGATIVE
Unit division: 0

## 2014-06-28 LAB — CBC
HCT: 27.8 % — ABNORMAL LOW (ref 39.0–52.0)
Hemoglobin: 8.6 g/dL — ABNORMAL LOW (ref 13.0–17.0)
MCH: 27.4 pg (ref 26.0–34.0)
MCHC: 30.9 g/dL (ref 30.0–36.0)
MCV: 88.5 fL (ref 78.0–100.0)
Platelets: 132 10*3/uL — ABNORMAL LOW (ref 150–400)
RBC: 3.14 MIL/uL — ABNORMAL LOW (ref 4.22–5.81)
RDW: 16.9 % — AB (ref 11.5–15.5)
WBC: 10 10*3/uL (ref 4.0–10.5)

## 2014-06-28 LAB — COMPREHENSIVE METABOLIC PANEL
ALK PHOS: 102 U/L (ref 38–126)
ALT: 52 U/L (ref 17–63)
AST: 16 U/L (ref 15–41)
Albumin: 3 g/dL — ABNORMAL LOW (ref 3.5–5.0)
Anion gap: 7 (ref 5–15)
BUN: 26 mg/dL — ABNORMAL HIGH (ref 6–20)
CALCIUM: 8.6 mg/dL — AB (ref 8.9–10.3)
CHLORIDE: 99 mmol/L — AB (ref 101–111)
CO2: 36 mmol/L — AB (ref 22–32)
Creatinine, Ser: 1.54 mg/dL — ABNORMAL HIGH (ref 0.61–1.24)
GFR, EST AFRICAN AMERICAN: 47 mL/min — AB (ref >60)
GFR, EST NON AFRICAN AMERICAN: 40 mL/min — AB (ref >60)
Glucose, Bld: 108 mg/dL — ABNORMAL HIGH (ref 65–99)
Potassium: 3.8 mmol/L (ref 3.5–5.1)
SODIUM: 142 mmol/L (ref 135–145)
TOTAL PROTEIN: 5.1 g/dL — AB (ref 6.5–8.1)
Total Bilirubin: 0.5 mg/dL (ref 0.3–1.2)

## 2014-06-28 SURGERY — EGD (ESOPHAGOGASTRODUODENOSCOPY)
Anesthesia: Moderate Sedation

## 2014-06-28 MED ORDER — SUCRALFATE 1 G PO TABS
1.0000 g | ORAL_TABLET | Freq: Three times a day (TID) | ORAL | Status: DC
  Administered 2014-06-28 – 2014-06-30 (×9): 1 g via ORAL
  Filled 2014-06-28 (×10): qty 1

## 2014-06-28 MED ORDER — BUTAMBEN-TETRACAINE-BENZOCAINE 2-2-14 % EX AERO
INHALATION_SPRAY | CUTANEOUS | Status: DC | PRN
  Administered 2014-06-28: 2 via TOPICAL

## 2014-06-28 MED ORDER — DIPHENHYDRAMINE HCL 50 MG/ML IJ SOLN
INTRAMUSCULAR | Status: AC
  Filled 2014-06-28: qty 1

## 2014-06-28 MED ORDER — ALUM & MAG HYDROXIDE-SIMETH 200-200-20 MG/5ML PO SUSP
30.0000 mL | Freq: Once | ORAL | Status: AC
  Administered 2014-06-28: 30 mL via ORAL
  Filled 2014-06-28: qty 30

## 2014-06-28 MED ORDER — GI COCKTAIL ~~LOC~~
30.0000 mL | Freq: Three times a day (TID) | ORAL | Status: DC | PRN
  Administered 2014-06-28 – 2014-07-03 (×7): 30 mL via ORAL
  Filled 2014-06-28 (×11): qty 30

## 2014-06-28 MED ORDER — FENTANYL CITRATE (PF) 100 MCG/2ML IJ SOLN
INTRAMUSCULAR | Status: AC
  Filled 2014-06-28: qty 2

## 2014-06-28 MED ORDER — MIDAZOLAM HCL 5 MG/ML IJ SOLN
INTRAMUSCULAR | Status: AC
  Filled 2014-06-28: qty 1

## 2014-06-28 MED ORDER — FENTANYL CITRATE (PF) 100 MCG/2ML IJ SOLN
INTRAMUSCULAR | Status: DC | PRN
  Administered 2014-06-28: 12.5 ug via INTRAVENOUS

## 2014-06-28 NOTE — Op Note (Signed)
[facility] [place], [id number]   ENDOSCOPY PROCEDURE REPORT  PATIENT: Chase Johnson, Chase Johnson  MR#: [id number] BIRTHDATE: [birth year] , 76  yrs. old GENDER: male ENDOSCOPIST:Talani Brazee [name], MD REFERRED BY:    [facility] PROCEDURE DATE:  July 12, 2014 PROCEDURE:   EGD, diagnostic ASA CLASS:    Class IV INDICATIONS: slowly dropping hemoglobin rule out active G.I. bleeding. MEDICATION: 12.5 TOPICAL ANESTHETIC:   Cetacaine Spray  DESCRIPTION OF PROCEDURE:   After the risks and benefits of the procedure were explained, informed consent was obtained.  The Pentax Gastroscope Peds [id number]  endoscope was introduced through the mouth  and advanced to the second portion of the duodenum . The instrument was slowly withdrawn as the mucosa was fully examined. Estimated blood loss is zero unless otherwise noted in this procedure report.      ESOPHAGUS: The mucosa of the esophagus appeared normal.  STOMACH: The mucosa of the stomach appeared normal.  DUODENUM: The duodenal mucosa showed no abnormalities. Retroflexed views revealed no abnormalities.    The scope was then withdrawn from the patient and the procedure completed.  COMPLICATIONS: There were no immediate complications.  ENDOSCOPIC IMPRESSION: 1.   The mucosa of the esophagus appeared normal 2.   The mucosa of the stomach appeared normal 3.   The duodenal mucosa showed no abnormalities 4.    No upper G.I. source of blood loss to explain the patient's drop in hemoglobin RECOMMENDATIONS: I would go ahead and feed him and follow him clinically.  He did have some slight CO2 retention during EGD.  I think he would be relatively high risk for colonoscopy unless was signs of active bleeding.   _______________________________ Lorrin MaisLaurence Spates, MD Jul 12, 2014 12:20 PM     cc:  CPT CODES: ICD CODES:  The ICD and CPT codes recommended by this software are interpretations from  the data that the clinical staff has captured with the software.  The verification of the translation of this report to the ICD and CPT codes and modifiers is the sole responsibility of the health care institution and practicing physician where this report was generated.  [employer] will not be held responsible for the validity of the ICD and CPT codes included on this report.  AMA assumes no liability for data contained or not contained herein. CPT is a Designer, television/film set of the [employer].  PATIENT NAME:  [name], [name] MR#: [id number]

## 2014-06-28 NOTE — Progress Notes (Signed)
Prn dose effective; pt resting comfortably in bed with call light within reach. Reported off to oncoming nurse; RN also informed of MD requesting for pt stool to be assessed for s/s bleed whenever pt has a BM. Francis Gaines Nissan Frazzini RN.

## 2014-06-28 NOTE — Progress Notes (Signed)
Pt transported off unit to ENDO for procedure. Francis Gaines Alford Gamero RN.

## 2014-06-28 NOTE — Interval H&P Note (Signed)
History and Physical Interval Note:  06/28/2014 11:26 AM  Chase Johnson  has presented today for surgery, with the diagnosis of gi BLEED  The various methods of treatment have been discussed with the patient and family. After consideration of risks, benefits and other options for treatment, the patient has consented to  Procedure(s): ESOPHAGOGASTRODUODENOSCOPY (EGD) (N/A) as a surgical intervention .  The patient's history has been reviewed, patient examined, no change in status, stable for surgery.  I have reviewed the patient's chart and labs.  Questions were answered to the patient's satisfaction.     Chase Johnson,Raphel Stickles L

## 2014-06-28 NOTE — Progress Notes (Signed)
Pt back to room from procedure; pt alert and verbally responsive; denies any pain. Resting comfortably in bed; will closely monitor. Delia Heady RN

## 2014-06-28 NOTE — Progress Notes (Signed)
Chase Johnson FTD:[id number] DOB: 1930/11/07 DOA: 06/25/2014 PCP: [name], PA-C  Brief narrative:  81 y/o ? htn, hld, CAD on plavix previosuly, PVD foll Dr. Nila Nephew showed bil;aterally occluded SFA's 12/2012,CKD c  Estimated Creatinine Clearance: 34.3 mL/min (by C-G formula based on Cr of 1.54).~ CKD stage 3,  2 hospitalizations ~ 1 month 5/30-->06/04/14 c PNA and COPD exacerb and also 06/11/14-->06/13/14 His wife lives at a nursing facility since 2012 Chase Johnson the cause of memory issues.  At baseline he typically ambulates with a walker and has had 2 falls in the recent month and a half He does not think that he has any problem with cough or cold after eating He states that he has had dark stools-"since been on all these medications" He tells me that he's been on oxygen since May of 2015?   Past medical history-As per Problem list Chart reviewed as below- reviewed  Consultants:   none  Procedures:  none  Antibiotics:  Ceftriaxone  Azithro   Subjective       Objective    Interim History:   Telemetry: PVC   Objective: Filed Vitals:   06/28/14 1205 06/28/14 1210 06/28/14 1306 06/28/14 1430  BP: 167/75 164/72  148/66  Pulse: 65 63  69  Temp:    98 F (36.7 C)  TempSrc:    Oral  Resp: 18 13  20   Height:      Weight:      SpO2: 93% 96% 96% 100%    Intake/Output Summary (Last 24 hours) at 06/28/14 1642 Last data filed at 06/28/14 1400  Gross per 24 hour  Intake    547 ml  Output   1500 ml  Net   -953 ml    Exam:  General: pleasant, arcus senilis Cardiovascular: s1 s 2no m/r/g  Reg rr Respiratory: clear no added sound Abdomen: soft nt nd no rebound no guard-rectal showed no gross blood on glove Skin no LE edema- of stasis dermatitis Neuro intact  Data Reviewed: Basic Metabolic Panel:  Recent Labs Lab 06/25/14 1912 06/26/14 0150 06/26/14 0708 06/27/14 0243 06/28/14 0258  NA 146* 148* 146* 139 142  K 4.8 4.8 4.7 4.0 3.8    CL 99* 96* 95* 95* 99*  CO2 43* 41* 38* 37* 36*  GLUCOSE 130* 109* 119* 136* 108*  BUN 17 21* 22* 31* 26*  CREATININE 1.33* 1.25* 1.25* 1.69* 1.54*  CALCIUM 9.0 9.5 9.2 8.2* 8.6*  MG  --  1.9  --   --   --    Liver Function Tests:  Recent Labs Lab 06/25/14 1912 06/26/14 0150 06/26/14 0708 06/27/14 0243 06/28/14 0258  AST 156* 85* 54* 21 16  ALT 116* 112* 93* 63 52  ALKPHOS 150* 156* 133* 107 102  BILITOT 0.9 0.8 0.5 0.1* 0.5  PROT 6.8 6.2* 5.7* 5.5* 5.1*  ALBUMIN 3.8 3.8 3.5 3.1* 3.0*   No results for input(s): LIPASE, AMYLASE in the last 168 hours. No results for input(s): AMMONIA in the last 168 hours. CBC:  Recent Labs Lab 06/25/14 1912 06/26/14 0150 06/26/14 0708 06/27/14 0243 06/27/14 1640 06/28/14 0258  WBC 14.4* 13.5* 12.5* 8.7 12.2* 10.0  NEUTROABS 13.6* 12.7* 11.9*  --   --   --   HGB 7.7* 7.5* 7.3* 6.9* 9.4* 8.6*  HCT 25.4* 24.8* 23.5* 22.0* 30.1* 27.8*  MCV 94.4 92.5 91.8 91.7 89.6 88.5  PLT 126* 89* 98* 92* 144* 132*   Cardiac Enzymes:  Recent Labs Lab 06/25/14  1912 06/26/14 0150  TROPONINI 0.03 <0.03   BNP: Invalid input(s): POCBNP CBG: No results for input(s): GLUCAP in the last 168 hours.  Recent Results (from the past 240 hour(s))  MRSA PCR Screening     Status: None   Collection Time: 06/25/14 11:48 PM  Result Value Ref Range Status   MRSA by PCR NEGATIVE NEGATIVE Final    Comment:        The GeneXpert MRSA Assay (FDA approved for NASAL specimens only), is one component of a comprehensive MRSA colonization surveillance program. It is not intended to diagnose MRSA infection nor to guide or monitor treatment for MRSA infections.      Studies:              All Imaging reviewed and is as per above notation   Scheduled Meds: . amLODipine  10 mg Oral Daily  . atorvastatin  40 mg Oral Daily  . azithromycin  500 mg Intravenous Q24H  . budesonide-formoterol  2 puff Inhalation BID  . cefTRIAXone (ROCEPHIN)  IV  1 g Intravenous  Q24H  . donepezil  5 mg Oral QHS  . ipratropium-albuterol  3 mL Nebulization TID  . metoprolol tartrate  37.5 mg Oral BID  . QUEtiapine  200 mg Oral QPM  . sodium chloride  3 mL Intravenous Q12H  . tamsulosin  0.4 mg Oral Daily   Continuous Infusions: . dextrose 5 % and 0.9% NaCl 75 mL/hr at 06/27/14 1229     Assessment/Plan: Present on Admission:    Acute respiratory failure with hypoxia and hypercarbia-etiology unclear.  On admission pH 7.29, PCO2 98 but probably multifactorial-anemia blood loss , COPD, aspiration Needed bipapa initally -Speech therapy saw patient-recommends dysphagia 3, thin liquids   GI bleed  -states he "has had 3 colonoscopies in the past"-I cannot find any records of the same.   Hb dropped to 6.9 and tx 1 u prbc.    -Hemoccult performed and is pending -[facility] performed endoscopy 6/26 which was neg -high risk for colonoscopy given respiratory issues -follow clinically.  Hemoglobin 6.9-->9.4 post tx-->8.6 -developed abd pain post procedure--given carafate.  MOnitor   CAD (coronary artery disease) -held Plavix 6/25 given acute concern for bleed.  Can consider more beta selective beta blocker 37.5 metoprolol twice a day for now    AKI (acute kidney injury) +Hyperosmolality and hypernatremia probably secondary to volume depletion -monitor labs in am.  continue saline 75 cc/hr - Estimated Creatinine Clearance: 34.3 mL/min (by C-G formula based on Cr of 1.54).   COPD exacerbation -no wheeze no rales no rhonchi   HTN (hypertension) -continue amlodipine 10 twice a day [unusual dose]    Peripheral arterial disease -balance risk and benefit of Plavix in this situation  As per above   Fall-multiple falls noted over the past month .  We will get therapy to evaluate the patient .  Patient has been recommended skilled nursing placement in the past    Fecal occult blood test positive in the past -see above discussion   Hyperlipidemia -continue  atorvastatin 40 daily    PTSD (post-traumatic stress disorder) -prior [place] with potential effects from this. Continue quetiapine 200 every afternoo  Code Status: DO NOT RESUSCITATE Famil communication:  Johnson,Chase Neighbor [phone]  [phone]  D/w the patient's HCPOA as above on 6/25 Disposition Plan:transfer to telemetry in 24 hours   Verneita Griffes, MD  [facility] Pager [phone] 06/28/2014, 4:42 PM    LOS: 3 days

## 2014-06-28 NOTE — Progress Notes (Signed)
Pt c/o abd pain, Dr. Verlon Au aware and prn GI cocktail ordered; prn dose adm; pt NSR telemetry. Will closely monitor. Delia Heady RN    06/28/14 1754  Vitals  Temp 98 F (36.7 C)  Temp Source Oral  BP (!) 172/65 mmHg  MAP (mmHg) 89  BP Location Right Arm  BP Method Automatic  Patient Position (if appropriate) Lying  Pulse Rate 91  Pulse Rate Source Dinamap  Resp 20  Oxygen Therapy  SpO2 95 %  O2 Device Nasal Cannula  O2 Flow Rate (L/min) 3 L/min

## 2014-06-28 NOTE — Evaluation (Signed)
Physical Therapy Evaluation Patient Details Name: Zuri Bradway MRN: [id number] DOB: 26-Aug-1930 Today's Date: 06/28/2014   History of Present Illness  Kalev XxxWhite is a 81 y.o. male with Past medical history of coronary artery disease, COPD, hypertension, peripheral vascular disease, dyslipidemia. Presented to ED after a fall  Clinical Impression  Pt admitted with above diagnosis. Pt currently with functional limitations due to the deficits listed below (see PT Problem List).  Pt will benefit from skilled PT to increase their independence and safety with mobility to allow discharge to the venue listed below.       Follow Up Recommendations Home health PT    Equipment Recommendations  Rolling walker with 5" wheels;3in1 (PT) (I belive he already has)    Recommendations for Other Services OT consult     Precautions / Restrictions Precautions Precautions: Fall      Mobility  Bed Mobility Overal bed mobility: Needs Assistance Bed Mobility: Supine to Sit;Sit to Supine     Supine to sit: Min assist Sit to supine: Min assist   General bed mobility comments: Min handheld assist to pull to sit; min assist to help LEs back into bed  Transfers Overall transfer level: Needs assistance Equipment used: Rolling walker (2 wheeled) Transfers: Sit to/from Stand Sit to Stand: Min guard         General transfer comment: minguard fo rsafety; dependent on UEs  Ambulation/Gait Ambulation/Gait assistance: Min guard (with and without physical contact) Ambulation Distance (Feet): 110 Feet Assistive device: Rolling walker (2 wheeled) Gait Pattern/deviations: Step-through pattern;Trunk flexed Gait velocity: slowed   General Gait Details: Slow moving, but relatively steady with RW  Stairs            Wheelchair Mobility    Modified Rankin (Stroke Patients Only)       Balance Overall balance assessment: Needs assistance           Standing balance-Leahy Scale: Poor  (approaching fair)                               Pertinent Vitals/Pain Pain Assessment: Faces Faces Pain Scale: Hurts even more Pain Location: abdomen and back Pain Descriptors / Indicators: Aching;Grimacing;Moaning Pain Intervention(s): Limited activity within patient's tolerance;Monitored during session;Repositioned    Home Living Family/patient expects to be discharged to:: Private residence Living Arrangements: Alone;Non-relatives/Friends Available Help at Discharge: Other (Comment);Friend(s) (pt reports he is only left alone ~2-3 hours per day) Type of Home: House Home Access: Stairs to enter Entrance Stairs-Rails: Psychiatric nurse of Steps: 3 Home Layout: One level Home Equipment: Walker - 2 wheels;Bedside commode;Tub bench      Prior Function Level of Independence: Needs assistance   Gait / Transfers Assistance Needed: independent  ADL's / Homemaking Assistance Needed: States he performs his ADL; friend cooks/cleans and takes him to appts        Hand Dominance   Dominant Hand: Right    Extremity/Trunk Assessment   Upper Extremity Assessment: Generalized weakness           Lower Extremity Assessment: Generalized weakness      Cervical / Trunk Assessment: Normal  Communication   Communication: No difficulties;Other (comment) (did not talk much on eval, post EGD)  Cognition Arousal/Alertness: Awake/alert Behavior During Therapy: WFL for tasks assessed/performed;Flat affect Overall Cognitive Status: Within Functional Limits for tasks assessed  General Comments General comments (skin integrity, edema, etc.): Session conducted on 3 L O2 and sats remained greater than or equal to 93%    Exercises        Assessment/Plan    PT Assessment Patient needs continued PT services  PT Diagnosis Difficulty walking;Generalized weakness   PT Problem List Decreased strength;Decreased activity  tolerance;Decreased balance;Decreased mobility;Decreased coordination;Decreased knowledge of use of DME;Decreased knowledge of precautions;Cardiopulmonary status limiting activity;Pain  PT Treatment Interventions DME instruction;Gait training;Stair training;Functional mobility training;Therapeutic activities;Therapeutic exercise;Balance training;Neuromuscular re-education;Patient/family education   PT Goals (Current goals can be found in the Care Plan section) Acute Rehab PT Goals Patient Stated Goal: hopes to get home PT Goal Formulation: With patient Time For Goal Achievement: 07/12/14 Potential to Achieve Goals: Good    Frequency Min 3X/week   Barriers to discharge Decreased caregiver support Must be modified independent to return home; is home alone a few hours a day    Co-evaluation               End of Session Equipment Utilized During Treatment: Gait belt;Oxygen Activity Tolerance: Patient tolerated treatment well Patient left: in bed;with call bell/phone within reach;with bed alarm set Nurse Communication: Mobility status         Time: 1115-5208 PT Time Calculation (min) (ACUTE ONLY): 23 min   Charges:   PT Evaluation $Initial PT Evaluation Tier I: 1 Procedure PT Treatments $Gait Training: 8-22 mins   PT G Codes:        Quin Hoop 06/28/2014, 5:13 PM  [place]oney [place], [place]  Acute Rehabilitation Services Pager [phone] Office [phone]

## 2014-06-28 NOTE — H&P (View-Only) (Signed)
[facility] CONSULT Reason for consult: drop in hemoglobin Referring Physician: [facility]. Primary G.I.: none patient unassigned  Chase Johnson is an 81 y.o. male.  HPI: he was admitted a couple days ago after falling. Apparently has hypoxia and uses oxygen at home. Felt the possibly have pneumonia. As COPD was given Solu-Medrol emplaced on BiPAP. He is markedly anemic has had a drop in hemoglobin since been in the hospital but only one stool check it was hemoglobin negative. The patient has been complaining of epigastric pain which he says he's had for 20 years. He is unable to tell me if he is ever had EGD or colonoscopy. Cannot find any record of any G.I. evaluation. Hemoglobin 6.9 was 8.7 month ago during an ER visit. Iron panel a few days ago was normal. Saturation was 24%. The patient is on clopidogrel at home for heart issues and denies taking pain medicines but states that he continues to have epigastric pain. According to his medication list he is not on any home proton pump inhibitor H2 blocker. Were asked to see him about his continuing drop in hemoglobin.  Past Medical History  Diagnosis Date  . CKD (chronic kidney disease)   . CAD (coronary artery disease)   . HTN (hypertension)   . PTSD (post-traumatic stress disorder)   . Tobacco abuse   . Hyperlipidemia   . COPD (chronic obstructive pulmonary disease)   . Peripheral arterial disease   . BPH (benign prostatic hyperplasia)     Past Surgical History  Procedure Laterality Date  . Prostate surgery      for BPH, but not know the detail by pt    Family History  Problem Relation Age of Onset  . Heart attack Father   . Hyperlipidemia Father   . Hypertension Father   . Stroke Father     Social History:  reports that he quit smoking about 18 months ago. His smoking use included Cigarettes. He does not have any smokeless tobacco history on file. He reports that he does not drink alcohol. His drug history is  not on file.  Allergies: No Known Allergies  Medications; Prior to Admission medications   Medication Sig Start Date End Date Taking? Authorizing Provider  albuterol (PROVENTIL) (2.5 MG/3ML) 0.083% nebulizer solution Take 3 mLs (2.5 mg total) by nebulization every 4 (four) hours as needed for wheezing or shortness of breath. 06/04/14  Yes Robbie Lis, MD  amLODipine (NORVASC) 10 MG tablet Take 10 mg by mouth daily. 05/20/14  Yes Historical Provider, MD  atorvastatin (LIPITOR) 40 MG tablet Take 40 mg by mouth daily. 05/20/14  Yes Historical Provider, MD  budesonide-formoterol (SYMBICORT) 160-4.5 MCG/ACT inhaler Inhale 2 puffs into the lungs 2 (two) times daily.   Yes Historical Provider, MD  clopidogrel (PLAVIX) 75 MG tablet Take 75 mg by mouth daily. 05/20/14  Yes Historical Provider, MD  dextromethorphan-guaiFENesin (MUCINEX DM) 30-600 MG per 12 hr tablet Take 1 tablet by mouth 2 (two) times daily. 06/13/14  Yes Hosie Poisson, MD  donepezil (ARICEPT) 5 MG tablet Take 5 mg by mouth at bedtime. 05/20/14  Yes Historical Provider, MD  furosemide (LASIX) 20 MG tablet Take 20 mg by mouth See admin instructions. Tues, Thurs, Sat, Sun   Yes Historical Provider, MD  haloperidol (HALDOL) 5 MG tablet Take 5-10 mg by mouth every 6 (six) hours as needed for agitation.  05/27/14  Yes Historical Provider, MD  ipratropium-albuterol (DUONEB) 0.5-2.5 (3) MG/3ML SOLN Take 3 mLs by nebulization  every 6 (six) hours as needed. Patient taking differently: Take 3 mLs by nebulization every 6 (six) hours as needed (shortness of breath).  06/13/14  Yes Hosie Poisson, MD  metoprolol tartrate (LOPRESSOR) 25 MG tablet Take 37.5 mg by mouth 2 (two) times daily. 05/20/14  Yes Historical Provider, MD  nitroGLYCERIN (NITROSTAT) 0.4 MG SL tablet Place 0.4 mg under the tongue every 5 (five) minutes as needed for chest pain.   Yes Historical Provider, MD  predniSONE (DELTASONE) 20 MG tablet Take 3 tablets (60 mg total) by mouth daily.  06/17/14  Yes Linton Flemings, MD  PROAIR HFA [phone] BASE) MCG/ACT inhaler Inhale 2 puffs into the lungs every 6 (six) hours as needed for wheezing.  05/20/14  Yes Historical Provider, MD  SEROQUEL XR 200 MG 24 hr tablet Take 200 mg by mouth every evening.  05/26/14  Yes Historical Provider, MD  tamsulosin (FLOMAX) 0.4 MG CAPS capsule Take 0.4 mg by mouth daily.    Yes Historical Provider, MD   . amLODipine  10 mg Oral Daily  . atorvastatin  40 mg Oral Daily  . azithromycin  500 mg Intravenous Q24H  . budesonide-formoterol  2 puff Inhalation BID  . cefTRIAXone (ROCEPHIN)  IV  1 g Intravenous Q24H  . donepezil  5 mg Oral QHS  . ipratropium-albuterol  3 mL Nebulization TID  . metoprolol tartrate  37.5 mg Oral BID  . QUEtiapine  200 mg Oral QPM  . sodium chloride  3 mL Intravenous Q12H  . tamsulosin  0.4 mg Oral Daily   PRN Meds haloperidol, hydrALAZINE, ondansetron **OR** ondansetron (ZOFRAN) IV Results for orders placed or performed during the hospital encounter of 06/25/14 (from the past 48 hour(s))  Urinalysis, Routine w reflex microscopic (not at [facility])     Status: Abnormal   Collection Time: 06/25/14  5:20 PM  Result Value Ref Range   Color, Urine YELLOW YELLOW   APPearance CLEAR CLEAR   Specific Gravity, Urine 1.015 1.005 - 1.030   pH 5.5 5.0 - 8.0   Glucose, UA NEGATIVE NEGATIVE mg/dL   Hgb urine dipstick NEGATIVE NEGATIVE   Bilirubin Urine NEGATIVE NEGATIVE   Ketones, ur NEGATIVE NEGATIVE mg/dL   Protein, ur 30 (A) NEGATIVE mg/dL   Urobilinogen, UA 0.2 0.0 - 1.0 mg/dL   Nitrite NEGATIVE NEGATIVE   Leukocytes, UA NEGATIVE NEGATIVE  Urine microscopic-add on     Status: Abnormal   Collection Time: 06/25/14  5:20 PM  Result Value Ref Range   Squamous Epithelial / LPF RARE RARE   RBC / HPF 0-2 <3 RBC/hpf   Bacteria, UA RARE RARE   Casts HYALINE CASTS (A) NEGATIVE  Urine rapid drug screen (hosp performed)     Status: None   Collection Time: 06/25/14  5:20 PM  Result Value Ref  Range   Opiates NONE DETECTED NONE DETECTED   Cocaine NONE DETECTED NONE DETECTED   Benzodiazepines NONE DETECTED NONE DETECTED   Amphetamines NONE DETECTED NONE DETECTED   Tetrahydrocannabinol NONE DETECTED NONE DETECTED   Barbiturates NONE DETECTED NONE DETECTED    Comment:        DRUG SCREEN FOR MEDICAL PURPOSES ONLY.  IF CONFIRMATION IS NEEDED FOR ANY PURPOSE, NOTIFY LAB WITHIN 5 DAYS.        LOWEST DETECTABLE LIMITS FOR URINE DRUG SCREEN Drug Class       Cutoff (ng/mL) Amphetamine      1000 Barbiturate      200 Benzodiazepine   518 Tricyclics  300 Opiates          300 Cocaine          300 THC              50   Comprehensive metabolic panel     Status: Abnormal   Collection Time: 06/25/14  7:12 PM  Result Value Ref Range   Sodium 146 (H) 135 - 145 mmol/L   Potassium 4.8 3.5 - 5.1 mmol/L   Chloride 99 (L) 101 - 111 mmol/L   CO2 43 (H) 22 - 32 mmol/L   Glucose, Bld 130 (H) 65 - 99 mg/dL   BUN 17 6 - 20 mg/dL   Creatinine, Ser 1.33 (H) 0.61 - 1.24 mg/dL   Calcium 9.0 8.9 - 10.3 mg/dL   Total Protein 6.8 6.5 - 8.1 g/dL   Albumin 3.8 3.5 - 5.0 g/dL   AST 156 (H) 15 - 41 U/L   ALT 116 (H) 17 - 63 U/L   Alkaline Phosphatase 150 (H) 38 - 126 U/L   Total Bilirubin 0.9 0.3 - 1.2 mg/dL   GFR calc non Af Amer 48 (L) >60 mL/min   GFR calc Af Amer 56 (L) >60 mL/min    Comment: (NOTE) The eGFR has been calculated using the CKD EPI equation. This calculation has not been validated in all clinical situations. eGFR's persistently <60 mL/min signify possible Chronic Kidney Disease.    Anion gap 4 (L) 5 - 15  Ethanol     Status: None   Collection Time: 06/25/14  7:12 PM  Result Value Ref Range   Alcohol, Ethyl (B) <5 <5 mg/dL    Comment:        LOWEST DETECTABLE LIMIT FOR SERUM ALCOHOL IS 5 mg/dL FOR MEDICAL PURPOSES ONLY   CBC with Differential     Status: Abnormal   Collection Time: 06/25/14  7:12 PM  Result Value Ref Range   WBC 14.4 (H) 4.0 - 10.5 K/uL   RBC  2.69 (L) 4.22 - 5.81 MIL/uL   Hemoglobin 7.7 (L) 13.0 - 17.0 g/dL   HCT 25.4 (L) 39.0 - 52.0 %   MCV 94.4 78.0 - 100.0 fL   MCH 28.6 26.0 - 34.0 pg   MCHC 30.3 30.0 - 36.0 g/dL   RDW 15.4 11.5 - 15.5 %   Platelets 126 (L) 150 - 400 K/uL    Comment: REPEATED TO VERIFY SPECIMEN CHECKED FOR CLOTS PLATELET COUNT CONFIRMED BY SMEAR    Neutrophils Relative % 94 (H) 43 - 77 %   Neutro Abs 13.6 (H) 1.7 - 7.7 K/uL   Lymphocytes Relative 3 (L) 12 - 46 %   Lymphs Abs 0.4 (L) 0.7 - 4.0 K/uL   Monocytes Relative 3 3 - 12 %   Monocytes Absolute 0.5 0.1 - 1.0 K/uL   Eosinophils Relative 0 0 - 5 %   Eosinophils Absolute 0.0 0.0 - 0.7 K/uL   Basophils Relative 0 0 - 1 %   Basophils Absolute 0.0 0.0 - 0.1 K/uL  Troponin I     Status: None   Collection Time: 06/25/14  7:12 PM  Result Value Ref Range   Troponin I 0.03 <0.031 ng/mL    Comment:        NO INDICATION OF MYOCARDIAL INJURY.   I-Stat arterial blood gas, ED     Status: Abnormal   Collection Time: 06/25/14  9:36 PM  Result Value Ref Range   pH, Arterial 7.290 (L) 7.350 - 7.450  pCO2 arterial 98.2 (HH) 35.0 - 45.0 mmHg   pO2, Arterial 212.0 (H) 80.0 - 100.0 mmHg   Bicarbonate 47.1 (H) 20.0 - 24.0 mEq/L   TCO2 >50 0 - 100 mmol/L   O2 Saturation 100.0 %   Acid-Base Excess 17.0 (H) 0.0 - 2.0 mmol/L   Patient temperature 98.8 F    Collection site RADIAL, ALLEN'S TEST ACCEPTABLE    Drawn by Operator    Sample type ARTERIAL    Comment NOTIFIED PHYSICIAN   MRSA PCR Screening     Status: None   Collection Time: 06/25/14 11:48 PM  Result Value Ref Range   MRSA by PCR NEGATIVE NEGATIVE    Comment:        The GeneXpert MRSA Assay (FDA approved for NASAL specimens only), is one component of a comprehensive MRSA colonization surveillance program. It is not intended to diagnose MRSA infection nor to guide or monitor treatment for MRSA infections.   Blood gas, arterial     Status: Abnormal   Collection Time: 06/26/14  1:29 AM   Result Value Ref Range   FIO2 0.30 %   Delivery systems BILEVEL POSITIVE AIRWAY PRESSURE    Inspiratory PAP 10    Expiratory PAP 5    pH, Arterial 7.440 7.350 - 7.450    Comment: CORRECTED ON 06/24 AT 1453: PREVIOUSLY REPORTED AS 7.433   pCO2 arterial 60.5 (HH) 35.0 - 45.0 mmHg    Comment: CRITICAL RESULT CALLED TO, READ BACK BY AND VERIFIED WITH: TARA TOMLINSON,RN AT 0139,BY TIM SNIDER RRT,RCP ON 06/26/14 CORRECTED ON 06/24 AT 1453: PREVIOUSLY REPORTED AS 61.9 CRITICAL RESULT CALLED TO, READ BACK BY AND VERIFIED WITH: TARA TOMLINSON,RN AT 0139,BY TIM SNIDER RRT,RCP ON 06/26/14    pO2, Arterial 89.3 80.0 - 100.0 mmHg    Comment: CORRECTED ON 06/24 AT 1453: PREVIOUSLY REPORTED AS 91.7   Bicarbonate 40.7 (H) 20.0 - 24.0 mEq/L   TCO2 42.6 0 - 100 mmol/L   Acid-Base Excess 15.4 (H) 0.0 - 2.0 mmol/L   O2 Saturation 98.5 %   Patient temperature 97.7     Comment: CORRECTED ON 06/24 AT 1339: PREVIOUSLY REPORTED AS 98.6   Collection site RIGHT RADIAL    Drawn by [id number]    Sample type ARTERIAL DRAW    Allens test (pass/fail) PASS PASS  Comprehensive metabolic panel     Status: Abnormal   Collection Time: 06/26/14  1:50 AM  Result Value Ref Range   Sodium 148 (H) 135 - 145 mmol/L   Potassium 4.8 3.5 - 5.1 mmol/L   Chloride 96 (L) 101 - 111 mmol/L   CO2 41 (H) 22 - 32 mmol/L   Glucose, Bld 109 (H) 65 - 99 mg/dL   BUN 21 (H) 6 - 20 mg/dL   Creatinine, Ser 1.25 (H) 0.61 - 1.24 mg/dL   Calcium 9.5 8.9 - 10.3 mg/dL   Total Protein 6.2 (L) 6.5 - 8.1 g/dL   Albumin 3.8 3.5 - 5.0 g/dL   AST 85 (H) 15 - 41 U/L   ALT 112 (H) 17 - 63 U/L   Alkaline Phosphatase 156 (H) 38 - 126 U/L   Total Bilirubin 0.8 0.3 - 1.2 mg/dL   GFR calc non Af Amer 52 (L) >60 mL/min   GFR calc Af Amer >60 >60 mL/min    Comment: (NOTE) The eGFR has been calculated using the CKD EPI equation. This calculation has not been validated in all clinical situations. eGFR's persistently <60 mL/min signify possible Chronic  Kidney Disease.    Anion gap 11 5 - 15  Magnesium     Status: None   Collection Time: 06/26/14  1:50 AM  Result Value Ref Range   Magnesium 1.9 1.7 - 2.4 mg/dL  CBC WITH DIFFERENTIAL     Status: Abnormal   Collection Time: 06/26/14  1:50 AM  Result Value Ref Range   WBC 13.5 (H) 4.0 - 10.5 K/uL   RBC 2.68 (L) 4.22 - 5.81 MIL/uL   Hemoglobin 7.5 (L) 13.0 - 17.0 g/dL   HCT 24.8 (L) 39.0 - 52.0 %   MCV 92.5 78.0 - 100.0 fL   MCH 28.0 26.0 - 34.0 pg   MCHC 30.2 30.0 - 36.0 g/dL   RDW 15.4 11.5 - 15.5 %   Platelets 89 (L) 150 - 400 K/uL    Comment: PLATELET COUNT CONFIRMED BY SMEAR   Neutrophils Relative % 94 (H) 43 - 77 %   Lymphocytes Relative 4 (L) 12 - 46 %   Monocytes Relative 2 (L) 3 - 12 %   Eosinophils Relative 0 0 - 5 %   Basophils Relative 0 0 - 1 %   Neutro Abs 12.7 (H) 1.7 - 7.7 K/uL   Lymphs Abs 0.5 (L) 0.7 - 4.0 K/uL   Monocytes Absolute 0.3 0.1 - 1.0 K/uL   Eosinophils Absolute 0.0 0.0 - 0.7 K/uL   Basophils Absolute 0.0 0.0 - 0.1 K/uL   RBC Morphology ELLIPTOCYTES   Protime-INR     Status: None   Collection Time: 06/26/14  1:50 AM  Result Value Ref Range   Prothrombin Time 14.5 11.6 - 15.2 seconds   INR 1.12 0.00 - 1.49  Troponin I     Status: None   Collection Time: 06/26/14  1:50 AM  Result Value Ref Range   Troponin I <0.03 <0.031 ng/mL    Comment:        NO INDICATION OF MYOCARDIAL INJURY.   Brain natriuretic peptide     Status: Abnormal   Collection Time: 06/26/14  1:50 AM  Result Value Ref Range   B Natriuretic Peptide 313.0 (H) 0.0 - 100.0 pg/mL  Lactic acid, plasma     Status: None   Collection Time: 06/26/14  1:50 AM  Result Value Ref Range   Lactic Acid, Venous 1.5 0.5 - 2.0 mmol/L  Reticulocytes     Status: Abnormal   Collection Time: 06/26/14  1:50 AM  Result Value Ref Range   Retic Ct Pct 2.5 0.4 - 3.1 %   RBC. 2.68 (L) 4.22 - 5.81 MIL/uL   Retic Count, Manual 67.0 19.0 - 186.0 K/uL  Type and screen     Status: None (Preliminary  result)   Collection Time: 06/26/14  1:50 AM  Result Value Ref Range   ABO/RH(D) O POS    Antibody Screen NEG    Sample Expiration 06/29/2014    Unit Number [id number]    Blood Component Type RED CELLS,LR    Unit division 00    Status of Unit ISSUED    Transfusion Status OK TO TRANSFUSE    Crossmatch Result Compatible   Iron and TIBC     Status: None   Collection Time: 06/26/14  1:50 AM  Result Value Ref Range   Iron 78 45 - 182 ug/dL   TIBC 304 250 - 450 ug/dL   Saturation Ratios 26 17.9 - 39.5 %   UIBC 226 ug/dL  CBC with Differential/Platelet     Status: Abnormal  Collection Time: 06/26/14  7:08 AM  Result Value Ref Range   WBC 12.5 (H) 4.0 - 10.5 K/uL   RBC 2.56 (L) 4.22 - 5.81 MIL/uL   Hemoglobin 7.3 (L) 13.0 - 17.0 g/dL   HCT 23.5 (L) 39.0 - 52.0 %   MCV 91.8 78.0 - 100.0 fL   MCH 28.5 26.0 - 34.0 pg   MCHC 31.1 30.0 - 36.0 g/dL   RDW 15.3 11.5 - 15.5 %   Platelets 98 (L) 150 - 400 K/uL    Comment: PLATELET COUNT CONFIRMED BY SMEAR   Neutrophils Relative % 95 (H) 43 - 77 %   Neutro Abs 11.9 (H) 1.7 - 7.7 K/uL   Lymphocytes Relative 3 (L) 12 - 46 %   Lymphs Abs 0.4 (L) 0.7 - 4.0 K/uL   Monocytes Relative 2 (L) 3 - 12 %   Monocytes Absolute 0.2 0.1 - 1.0 K/uL   Eosinophils Relative 0 0 - 5 %   Eosinophils Absolute 0.0 0.0 - 0.7 K/uL   Basophils Relative 0 0 - 1 %   Basophils Absolute 0.0 0.0 - 0.1 K/uL  Comprehensive metabolic panel     Status: Abnormal   Collection Time: 06/26/14  7:08 AM  Result Value Ref Range   Sodium 146 (H) 135 - 145 mmol/L   Potassium 4.7 3.5 - 5.1 mmol/L   Chloride 95 (L) 101 - 111 mmol/L   CO2 38 (H) 22 - 32 mmol/L   Glucose, Bld 119 (H) 65 - 99 mg/dL   BUN 22 (H) 6 - 20 mg/dL   Creatinine, Ser 1.25 (H) 0.61 - 1.24 mg/dL   Calcium 9.2 8.9 - 10.3 mg/dL   Total Protein 5.7 (L) 6.5 - 8.1 g/dL   Albumin 3.5 3.5 - 5.0 g/dL   AST 54 (H) 15 - 41 U/L   ALT 93 (H) 17 - 63 U/L   Alkaline Phosphatase 133 (H) 38 - 126 U/L   Total  Bilirubin 0.5 0.3 - 1.2 mg/dL   GFR calc non Af Amer 52 (L) >60 mL/min   GFR calc Af Amer >60 >60 mL/min    Comment: (NOTE) The eGFR has been calculated using the CKD EPI equation. This calculation has not been validated in all clinical situations. eGFR's persistently <60 mL/min signify possible Chronic Kidney Disease.    Anion gap 13 5 - 15  Protime-INR     Status: None   Collection Time: 06/26/14  7:08 AM  Result Value Ref Range   Prothrombin Time 15.0 11.6 - 15.2 seconds   INR 1.16 0.00 - 1.49  CBC     Status: Abnormal   Collection Time: 06/27/14  2:43 AM  Result Value Ref Range   WBC 8.7 4.0 - 10.5 K/uL   RBC 2.40 (L) 4.22 - 5.81 MIL/uL   Hemoglobin 6.9 (LL) 13.0 - 17.0 g/dL    Comment: REPEATED TO VERIFY CRITICAL RESULT CALLED TO, READ BACK BY AND VERIFIED WITH: T WOLF,RN6/25/16 0334 RHOLMES    HCT 22.0 (L) 39.0 - 52.0 %   MCV 91.7 78.0 - 100.0 fL   MCH 28.8 26.0 - 34.0 pg   MCHC 31.4 30.0 - 36.0 g/dL   RDW 15.3 11.5 - 15.5 %   Platelets 92 (L) 150 - 400 K/uL    Comment: REPEATED TO VERIFY PLATELET COUNT CONFIRMED BY SMEAR   Comprehensive metabolic panel     Status: Abnormal   Collection Time: 06/27/14  2:43 AM  Result Value Ref Range  Sodium 139 135 - 145 mmol/L    Comment: DELTA CHECK NOTED   Potassium 4.0 3.5 - 5.1 mmol/L   Chloride 95 (L) 101 - 111 mmol/L   CO2 37 (H) 22 - 32 mmol/L   Glucose, Bld 136 (H) 65 - 99 mg/dL   BUN 31 (H) 6 - 20 mg/dL   Creatinine, Ser 1.69 (H) 0.61 - 1.24 mg/dL   Calcium 8.2 (L) 8.9 - 10.3 mg/dL   Total Protein 5.5 (L) 6.5 - 8.1 g/dL   Albumin 3.1 (L) 3.5 - 5.0 g/dL   AST 21 15 - 41 U/L   ALT 63 17 - 63 U/L   Alkaline Phosphatase 107 38 - 126 U/L   Total Bilirubin 0.1 (L) 0.3 - 1.2 mg/dL   GFR calc non Af Amer 36 (L) >60 mL/min   GFR calc Af Amer 42 (L) >60 mL/min    Comment: (NOTE) The eGFR has been calculated using the CKD EPI equation. This calculation has not been validated in all clinical situations. eGFR's  persistently <60 mL/min signify possible Chronic Kidney Disease.    Anion gap 7 5 - 15  Protime-INR     Status: None   Collection Time: 06/27/14  2:43 AM  Result Value Ref Range   Prothrombin Time 14.7 11.6 - 15.2 seconds   INR 1.13 0.00 - 1.49  Prepare RBC     Status: None   Collection Time: 06/27/14  4:01 AM  Result Value Ref Range   Order Confirmation ORDER PROCESSED BY BLOOD BANK   Occult blood card to lab, stool RN will collect     Status: None   Collection Time: 06/27/14 10:55 AM  Result Value Ref Range   Fecal Occult Bld NEGATIVE NEGATIVE    Dg Ribs Unilateral W/chest Right  06/25/2014   CLINICAL DATA:  The patient tripped today. Right chest pain. Initial encounter.  EXAM: RIGHT RIBS AND CHEST - 3+ VIEW  COMPARISON:  None.  FINDINGS: No fracture or other bone lesions are seen involving the ribs. There is no evidence of pneumothorax. Very small left effusion is noted. Both lungs are clear. Heart size and mediastinal contours are within normal limits.  IMPRESSION: Negative for fracture.  Small left pleural effusion.   Electronically Signed   By: Inge Rise M.D.   On: 06/25/2014 18:18   Dg Lumbar Spine Complete  06/25/2014   CLINICAL DATA:  Acute lower back pain after fall today. Initial encounter.  EXAM: LUMBAR SPINE - COMPLETE 4+ VIEW  COMPARISON:  None.  FINDINGS: No fracture or spondylolisthesis is noted. Moderate degenerative disc disease is noted at L3-4, L4-5 and L5-S1 with anterior osteophyte formation at these levels. Degenerative joint disease of posterior facet joints of lower lumbar spine are noted.  IMPRESSION: Multilevel degenerative disc disease. No acute abnormality seen in the lumbar spine.   Electronically Signed   By: Marijo Conception, M.D.   On: 06/25/2014 19:33   Ct Head Wo Contrast  06/25/2014   CLINICAL DATA:  Patient with recent fall and confusion. Patient fell through the door.  EXAM: CT HEAD WITHOUT CONTRAST  TECHNIQUE: Contiguous axial images were  obtained from the base of the skull through the vertex without intravenous contrast.  COMPARISON:  Brain CT 06/11/2014  FINDINGS: Ventricles and sulci are appropriate for patient's age. Mild periventricular and subcortical Creasman matter hypodensity compatible with chronic small vessel ischemic changes. No evidence for acute cortically based infarct, intracranial hemorrhage, mass lesion or mass effect. Mucosal  thickening within the ethmoid air cells. Status post sinus surgery. Mastoid air cells are well aerated. Calvarium is intact.  IMPRESSION: No acute intracranial process.  Chronic small vessel ischemic changes.   Electronically Signed   By: Lovey Newcomer M.D.   On: 06/25/2014 19:58              Blood pressure 145/48, pulse 70, temperature 98.2 F (36.8 C), temperature source Oral, resp. rate 18, height _0  (1.854 m), weight 64.592 kg (142 lb 6.4 oz), SpO2 97 %.  Physical exam:   General-- pleasant African-American male no distress, has oxygen by nasal cannula in place ENT-- mucous membranes moist nonicteric Neck-- lymphadenopathy Heart-- regular rate and rhythm without murmurs gallops Lungs-- diminish breast sound bilaterally Abdomen-- mild epigastric tenderness normal bowel sounds no masses palpated Psych-- alert and oriented. Asked appropriate questions about EGD   Assessment: 1. Anemia. One stool test is negative and design studies do not suggest chronic bleeding. He has had continued drop in hemoglobin and with the epigastric pain I think he should at least have EGD. He does have significant COPD I think we could perform this procedure safely with light sedation. Colonoscopy would be more risky and we may want to consider alternative such as virtual colonoscopy or barium enema 2. COPD on chronic OT therapy 3. PTSD on multiple medications 4. CAD, CKD, hypertension, peripheral vascular disease  Plan: we will tentatively plan EGD tomorrow with light sedation. We will likely use  the pediatric upper scope which will help [place] avoid excessive sedation. Don't feel that he would be a good candidate for colonoscopy. We will check additional stools for bleeding.   Gabryella Murfin JR,Yacob Wilkerson L 06/27/2014, 2:59 PM   Pager: [phone] If no answer or after hours call [phone]

## 2014-06-29 ENCOUNTER — Encounter (HOSPITAL_COMMUNITY): Payer: Self-pay | Admitting: Gastroenterology

## 2014-06-29 LAB — CBC WITH DIFFERENTIAL/PLATELET
BASOS ABS: 0 10*3/uL (ref 0.0–0.1)
BASOS PCT: 0 % (ref 0–1)
EOS ABS: 0.1 10*3/uL (ref 0.0–0.7)
EOS PCT: 1 % (ref 0–5)
HCT: 27.7 % — ABNORMAL LOW (ref 39.0–52.0)
Hemoglobin: 8.8 g/dL — ABNORMAL LOW (ref 13.0–17.0)
Lymphocytes Relative: 10 % — ABNORMAL LOW (ref 12–46)
Lymphs Abs: 1 10*3/uL (ref 0.7–4.0)
MCH: 28.3 pg (ref 26.0–34.0)
MCHC: 31.8 g/dL (ref 30.0–36.0)
MCV: 89.1 fL (ref 78.0–100.0)
Monocytes Absolute: 0.9 10*3/uL (ref 0.1–1.0)
Monocytes Relative: 9 % (ref 3–12)
Neutro Abs: 7.8 10*3/uL — ABNORMAL HIGH (ref 1.7–7.7)
Neutrophils Relative %: 79 % — ABNORMAL HIGH (ref 43–77)
PLATELETS: 88 10*3/uL — AB (ref 150–400)
RBC: 3.11 MIL/uL — ABNORMAL LOW (ref 4.22–5.81)
RDW: 15.8 % — AB (ref 11.5–15.5)
WBC: 9.8 10*3/uL (ref 4.0–10.5)

## 2014-06-29 MED ORDER — CLOPIDOGREL BISULFATE 75 MG PO TABS
75.0000 mg | ORAL_TABLET | Freq: Every day | ORAL | Status: DC
  Administered 2014-06-29 – 2014-07-03 (×5): 75 mg via ORAL
  Filled 2014-06-29 (×5): qty 1

## 2014-06-29 MED ORDER — SORBITOL 70 % SOLN
30.0000 mL | Freq: Every day | Status: DC | PRN
  Filled 2014-06-29: qty 30

## 2014-06-29 NOTE — Progress Notes (Signed)
[facility] Progress Note  Subjective: Patient seen in regards to anemia. EGD was negative. Given his medical issues and pulmonary status it was felt that colonoscopy would be too high risk. Discussed this with him. He stated that he didn't want to do anything that was risky for him.  Objective: Vital signs in last 24 hours: Temp:  [97.7 F (36.5 C)-98.1 F (36.7 C)] 97.7 F (36.5 C) (06/27 0547) Pulse Rate:  [63-91] 69 (06/27 0547) Resp:  [13-20] 20 (06/27 0547) BP: (105-203)/(59-110) 105/68 mmHg (06/27 0547) SpO2:  [92 %-100 %] 99 % (06/27 0806) Weight:  [65.046 kg (143 lb 6.4 oz)] 65.046 kg (143 lb 6.4 oz) (06/27 0547) Weight change: -0.454 kg (-1 lb)   PE:  No distress  Seems somewhat short of breath  Lungs clear  Heart regular rhythm  Abdomen: Soft and nontender  Lab Results: Results for orders placed or performed during the hospital encounter of 06/25/14 (from the past 24 hour(s))  CBC with Differential/Platelet     Status: Abnormal   Collection Time: 06/29/14  3:00 AM  Result Value Ref Range   WBC 9.8 4.0 - 10.5 K/uL   RBC 3.11 (L) 4.22 - 5.81 MIL/uL   Hemoglobin 8.8 (L) 13.0 - 17.0 g/dL   HCT 27.7 (L) 39.0 - 52.0 %   MCV 89.1 78.0 - 100.0 fL   MCH 28.3 26.0 - 34.0 pg   MCHC 31.8 30.0 - 36.0 g/dL   RDW 15.8 (H) 11.5 - 15.5 %   Platelets 88 (L) 150 - 400 K/uL   Neutrophils Relative % 79 (H) 43 - 77 %   Neutro Abs 7.8 (H) 1.7 - 7.7 K/uL   Lymphocytes Relative 10 (L) 12 - 46 %   Lymphs Abs 1.0 0.7 - 4.0 K/uL   Monocytes Relative 9 3 - 12 %   Monocytes Absolute 0.9 0.1 - 1.0 K/uL   Eosinophils Relative 1 0 - 5 %   Eosinophils Absolute 0.1 0.0 - 0.7 K/uL   Basophils Relative 0 0 - 1 %   Basophils Absolute 0.0 0.0 - 0.1 K/uL    Studies/Results: No results found.    Assessment: Anemia  Negative EGD    Plan:   I asked him if he had ever had his colon checked and he told me no. I agree he would be high risk for colonoscopy. If further  evaluation of the colon is needed we could consider barium enema. Seems to be somewhat short of breath at this time.    SAM F Kharis Lapenna 06/29/2014, 10:12 AM  Pager: [phone] If no answer or after 5 PM call [phone]

## 2014-06-29 NOTE — Care Management Note (Signed)
Case Management Note  Patient Details  Name: Chase Johnson MRN: [id number] Date of Birth: 1930-01-24  Subjective/Objective:        Admitted with Acute Resp Failure            Action/Plan: CM talked to patient about DCP; patient is very private and does not to be questioned about his living conditions. Patient lives at home alone with 3 private caregiver around the clock. He has a walker,cane and home 02 with [employer]. CM offered HHPT, patient refused. Stated I had it before and they said that I did not need it anymore.   Expected Discharge Date:   06/30/2014               Expected Discharge Plan:  [place]  Discharge planning Services  CM Consult  Status of Service:  In process, will continue to follow   Sherrilyn Rist [phone] 06/29/2014, 10:43 AM

## 2014-06-29 NOTE — Progress Notes (Signed)
Chase Johnson BSW:[id number] DOB: [birth year] DOA: 06/25/2014 PCP: [name], PA-C  Brief narrative:  81 y/o ? htn, hld, CAD on plavix previosuly, PVD foll Dr. Nila Nephew showed bil;aterally occluded SFA's 12/2012,CKD c  Estimated Creatinine Clearance: 34 mL/min (by C-G formula based on Cr of 1.54).~ CKD stage 3,  2 hospitalizations ~ 1 month 5/30-->06/04/14 c PNA and COPD exacerb and also 06/11/14-->06/13/14 His wife lives at a nursing facility since 2012 [name] the cause of memory issues.  At baseline he typically ambulates with a walker and has had 2 falls in the recent month and a half He does not think that he has any problem with cough or cold after eating He states that he has had dark stools-"since been on all these medications" He tells me that he's been on oxygen since May of 2015?   Past medical history-As per Problem list Chart reviewed as below- reviewed  Consultants:   none  Procedures:  none  Antibiotics:  Ceftriaxone  Azithro   Subjective   well Wants to go home No n/v/cp toelrating diet No sob off of O2    Objective    Interim History:   Telemetry: PVC   Objective: Filed Vitals:   06/28/14 2024 06/28/14 2033 06/29/14 0547 06/29/14 0806  BP:  139/68 105/68   Pulse:  87 69   Temp:  98.1 F (36.7 C) 97.7 F (36.5 C)   TempSrc:  Oral Oral   Resp:  20 20   Height:      Weight:   65.046 kg (143 lb 6.4 oz)   SpO2: 96% 97% 100% 99%    Intake/Output Summary (Last 24 hours) at 06/29/14 1112 Last data filed at 06/29/14 4665  Gross per 24 hour  Intake    654 ml  Output   1225 ml  Net   -571 ml    Exam:  General: pleasant, arcus senilis Cardiovascular: s1 s 2no m/r/g  Reg rr Respiratory: clear no added sound Abdomen: soft nt nd no rebound no guardSkin no LE edema- of stasis dermatitis Neuro intact  Data Reviewed: Basic Metabolic Panel:  Recent Labs Lab 06/25/14 1912 06/26/14 0150 06/26/14 0708 06/27/14 0243  06/28/14 0258  NA 146* 148* 146* 139 142  K 4.8 4.8 4.7 4.0 3.8  CL 99* 96* 95* 95* 99*  CO2 43* 41* 38* 37* 36*  GLUCOSE 130* 109* 119* 136* 108*  BUN 17 21* 22* 31* 26*  CREATININE 1.33* 1.25* 1.25* 1.69* 1.54*  CALCIUM 9.0 9.5 9.2 8.2* 8.6*  MG  --  1.9  --   --   --    Liver Function Tests:  Recent Labs Lab 06/25/14 1912 06/26/14 0150 06/26/14 0708 06/27/14 0243 06/28/14 0258  AST 156* 85* 54* 21 16  ALT 116* 112* 93* 63 52  ALKPHOS 150* 156* 133* 107 102  BILITOT 0.9 0.8 0.5 0.1* 0.5  PROT 6.8 6.2* 5.7* 5.5* 5.1*  ALBUMIN 3.8 3.8 3.5 3.1* 3.0*   No results for input(s): LIPASE, AMYLASE in the last 168 hours. No results for input(s): AMMONIA in the last 168 hours. CBC:  Recent Labs Lab 06/25/14 1912 06/26/14 0150 06/26/14 0708 06/27/14 0243 06/27/14 1640 06/28/14 0258 06/29/14 0300  WBC 14.4* 13.5* 12.5* 8.7 12.2* 10.0 9.8  NEUTROABS 13.6* 12.7* 11.9*  --   --   --  7.8*  HGB 7.7* 7.5* 7.3* 6.9* 9.4* 8.6* 8.8*  HCT 25.4* 24.8* 23.5* 22.0* 30.1* 27.8* 27.7*  MCV 94.4 92.5 91.8 91.7  89.6 88.5 89.1  PLT 126* 89* 98* 92* 144* 132* 88*   Cardiac Enzymes:  Recent Labs Lab 06/25/14 1912 06/26/14 0150  TROPONINI 0.03 <0.03   BNP: Invalid input(s): POCBNP CBG: No results for input(s): GLUCAP in the last 168 hours.  Recent Results (from the past 240 hour(s))  MRSA PCR Screening     Status: None   Collection Time: 06/25/14 11:48 PM  Result Value Ref Range Status   MRSA by PCR NEGATIVE NEGATIVE Final    Comment:        The GeneXpert MRSA Assay (FDA approved for NASAL specimens only), is one component of a comprehensive MRSA colonization surveillance program. It is not intended to diagnose MRSA infection nor to guide or monitor treatment for MRSA infections.      Studies:              All Imaging reviewed and is as per above notation   Scheduled Meds: . amLODipine  10 mg Oral Daily  . atorvastatin  40 mg Oral Daily  . budesonide-formoterol   2 puff Inhalation BID  . clopidogrel  75 mg Oral Daily  . donepezil  5 mg Oral QHS  . ipratropium-albuterol  3 mL Nebulization TID  . metoprolol tartrate  37.5 mg Oral BID  . QUEtiapine  200 mg Oral QPM  . sodium chloride  3 mL Intravenous Q12H  . sucralfate  1 g Oral TID WC & HS  . tamsulosin  0.4 mg Oral Daily   Continuous Infusions: . dextrose 5 % and 0.9% NaCl 75 mL/hr at 06/27/14 1229     Assessment/Plan: Present on Admission:    Acute respiratory failure with hypoxia and hypercarbia-etiology unclear.  On admission pH 7.29, PCO2 98 but probably multifactorial-anemia blood loss , COPD, aspiration Needed bipap initally -Speech therapy saw patient-recommends dysphagia 3, thin liquids   GI bleed  -states he "has had 3 colonoscopies in the past"-I cannot find any records of the same.   Hb dropped to 6.9 and tx 1 u prbc.    -Hemoccult performed and was neg on 6/24 -[facility] performed endoscopy 6/26 which was neg -high risk for colonoscopy given respiratory issues -follow clinically.  Hemoglobin 6.9-->9.4 post tx-->8.6 -developed abd pain post procedure--given carafate.  MOnitor   CAD (coronary artery disease) -held Plavix 6/25 given acute concern for bleed.  -I discussed personally c GI-they recommned re-start Anti-plt agent as risks of CVA/CAV/PAD outweigh bleeding risk Can consider more beta selective beta blocker 37.5 metoprolol twice a day for now    AKI (acute kidney injury) +Hyperosmolality and hypernatremia probably secondary to volume depletion -monitor labs in am.  continue saline 75 cc/hr - Estimated Creatinine Clearance: 34 mL/min (by C-G formula based on Cr of 1.54).   COPD exacerbation -no wheeze no rales no rhonchi   HTN (hypertension) -continue amlodipine 10 twice a day [unusual dose]    Peripheral arterial disease -balance risk and benefit of Plavix in this situation    Fall-multiple falls noted over the past month .  We will get therapy to evaluate  the patient .  Patient has been recommended skilled nursing placement in the past -he wishes to go home and [facility] has been recommended to him    Fecal occult blood test positive in the past -see above discussion   Hyperlipidemia -continue atorvastatin 40 daily    PTSD (post-traumatic stress disorder) -prior [place] with potential effects from this. Continue quetiapine 200 every afternoo  Code Status:  DO NOT RESUSCITATE Famil communication:  [name],[name] Neighbor [phone]  [phone]  D/w the patient's HCPOA as above on 6/25 Disposition Plan: likely d/c home with hh in 24 hours if no further bleed   Verneita Griffes, MD  [facility] Pager [phone] 06/29/2014, 11:12 AM    LOS: 4 days

## 2014-06-29 NOTE — Progress Notes (Signed)
Physical Therapy Treatment Patient Details Name: Chase Johnson MRN: [id number] DOB: 02-19-1930 Today's Date: 06/29/2014    History of Present Illness [name] is a 81 y.o. male with Past medical history of coronary artery disease, COPD, hypertension, peripheral vascular disease, dyslipidemia. Presented to ED after a fall    PT Comments    Pt ambulated 150' with RW and min-guard A, no LOB during ambulation but pt shows decreased safety awareness and awareness of deficits with slowed balance reactions in standing so falls continue to be a risk. PT will continue to follow.   Follow Up Recommendations  Home health PT     Equipment Recommendations  Rolling walker with 5" wheels;3in1 (PT)    Recommendations for Other Services OT consult     Precautions / Restrictions Precautions Precautions: Fall Restrictions Weight Bearing Restrictions: No    Mobility  Bed Mobility Overal bed mobility: Modified Independent Bed Mobility: Sit to Supine       Sit to supine: Modified independent (Device/Increase time)   General bed mobility comments: pt able to get into bed without physical assist  Transfers Overall transfer level: Needs assistance Equipment used: Rolling walker (2 wheeled) Transfers: Sit to/from Stand Sit to Stand: Supervision         General transfer comment: pt stood from chair without physical assist, supervision given for balance. Very good posture with initial standing  Ambulation/Gait Ambulation/Gait assistance: Min guard Ambulation Distance (Feet): 150 Feet Assistive device: Rolling walker (2 wheeled) Gait Pattern/deviations: Step-through pattern;Trunk flexed;Narrow base of support Gait velocity: slowed Gait velocity interpretation:  (between 1.8 and 2.62 ft/sec) General Gait Details: decreased pace and occasionally bumped things with RW but self corrected without LOB. No physical assistance needed. Pt needed assistance to find his room upon return  back down hallway. Increased trunk flexion with increaed distance but pt self corrected with vc's   Stairs            Wheelchair Mobility    Modified Rankin (Stroke Patients Only)       Balance Overall balance assessment: Needs assistance Sitting-balance support: No upper extremity supported;Feet supported Sitting balance-Leahy Scale: Good     Standing balance support: No upper extremity supported Standing balance-Leahy Scale: Fair Standing balance comment: pt able to stand without UE support but limitations evident when performing dynamic activity, close guarding given when pt moving without UE support                    Cognition Arousal/Alertness: Awake/alert Behavior During Therapy: Flat affect Overall Cognitive Status: No family/caregiver present to determine baseline cognitive functioning Area of Impairment: Safety/judgement;Problem solving     Memory: Decreased short-term memory   Safety/Judgement: Decreased awareness of deficits;Decreased awareness of safety   Problem Solving: Slow processing General Comments: pt with delayed processing of commands and mildly agitated upon therapist's arrival. Likely that this is baseline for pt.    Exercises      General Comments General comments (skin integrity, edema, etc.): VSS on 3L O2      Pertinent Vitals/Pain Pain Assessment: No/denies pain    Home Living                      Prior Function            PT Goals (current goals can now be found in the care plan section) Acute Rehab PT Goals Patient Stated Goal: hopes to get home PT Goal Formulation: With patient Time For Goal Achievement: 07/12/14  Potential to Achieve Goals: Good Progress towards PT goals: Progressing toward goals    Frequency  Min 3X/week    PT Plan Current plan remains appropriate    Co-evaluation             End of Session Equipment Utilized During Treatment: Gait belt;Oxygen Activity Tolerance: Patient  tolerated treatment well Patient left: in bed;with call bell/phone within reach;with bed alarm set     Time: 8453-6468 PT Time Calculation (min) (ACUTE ONLY): 21 min  Charges:  $Gait Training: 8-22 mins                    G Codes:     Leighton Roach, PT  Acute Rehab Services  [place], [place] 06/29/2014, 12:19 PM

## 2014-06-29 NOTE — Progress Notes (Signed)
Case discussed w/ Dr. Verlon Au; also reviewed Dr. Estell Harpin note.  I think we're all in agreement--see how he does back on anti-platelet therapy, but unless he is a surgical candidate, would not put him through a colonoscopy.  In fact, unless there's signs of ongoing significant blood loss, I wouldn't even put him through a barium enema.  The easiest/safest thing for him might be ongoing hgb monitoring through his PCP, w/ transfusions as needed to keep his hgb in the acceptable range.  Cleotis Nipper, M.D. Pager [phone] If no answer or after 5 PM call [phone]

## 2014-06-30 ENCOUNTER — Inpatient Hospital Stay (HOSPITAL_COMMUNITY): Payer: Medicare Other

## 2014-06-30 LAB — CBC WITH DIFFERENTIAL/PLATELET
Basophils Absolute: 0 10*3/uL (ref 0.0–0.1)
Basophils Relative: 0 % (ref 0–1)
EOS ABS: 0.2 10*3/uL (ref 0.0–0.7)
Eosinophils Relative: 2 % (ref 0–5)
HCT: 29.3 % — ABNORMAL LOW (ref 39.0–52.0)
Hemoglobin: 8.9 g/dL — ABNORMAL LOW (ref 13.0–17.0)
Lymphocytes Relative: 10 % — ABNORMAL LOW (ref 12–46)
Lymphs Abs: 1.1 10*3/uL (ref 0.7–4.0)
MCH: 27.6 pg (ref 26.0–34.0)
MCHC: 30.4 g/dL (ref 30.0–36.0)
MCV: 90.7 fL (ref 78.0–100.0)
MONO ABS: 0.6 10*3/uL (ref 0.1–1.0)
Monocytes Relative: 6 % (ref 3–12)
NEUTROS PCT: 82 % — AB (ref 43–77)
Neutro Abs: 8.6 10*3/uL — ABNORMAL HIGH (ref 1.7–7.7)
Platelets: 95 10*3/uL — ABNORMAL LOW (ref 150–400)
RBC: 3.23 MIL/uL — AB (ref 4.22–5.81)
RDW: 15.3 % (ref 11.5–15.5)
WBC: 10.5 10*3/uL (ref 4.0–10.5)

## 2014-06-30 MED ORDER — SUCRALFATE 1 G PO TABS
1.0000 g | ORAL_TABLET | Freq: Three times a day (TID) | ORAL | Status: DC
  Administered 2014-07-01 – 2014-07-03 (×10): 1 g via ORAL
  Filled 2014-06-30 (×13): qty 1

## 2014-06-30 MED ORDER — SORBITOL 70 % SOLN: 30.0000 mL | Freq: Every day | Status: AC | PRN

## 2014-06-30 MED ORDER — PANTOPRAZOLE SODIUM 40 MG PO TBEC: 40.0000 mg | DELAYED_RELEASE_TABLET | Freq: Every day | ORAL | Status: AC

## 2014-06-30 MED ORDER — FUROSEMIDE 20 MG PO TABS
20.0000 mg | ORAL_TABLET | Freq: Every day | ORAL | Status: DC
  Administered 2014-07-01 – 2014-07-03 (×3): 20 mg via ORAL
  Filled 2014-06-30 (×3): qty 1

## 2014-06-30 NOTE — Progress Notes (Signed)
[facility] Progress Note  Subjective: No signs of any further bleeding. Patient anxious to go home.  Objective: Vital signs in last 24 hours: Temp:  [97.8 F (36.6 C)-98.1 F (36.7 C)] 97.9 F (36.6 C) (06/28 0437) Pulse Rate:  [67-82] 67 (06/28 0437) Resp:  [16-18] 18 (06/28 0437) BP: (130-175)/(59-70) 130/59 mmHg (06/28 0437) SpO2:  [95 %-100 %] 100 % (06/28 0437) Weight:  [64.139 kg (141 lb 6.4 oz)] 64.139 kg (141 lb 6.4 oz) (06/28 0437) Weight change: -0.907 kg (-2 lb)   PE:  No distress  Heart regular rhythm  Abdomen soft nontender  Lab Results: Results for orders placed or performed during the hospital encounter of 06/25/14 (from the past 24 hour(s))  CBC with Differential/Platelet     Status: Abnormal   Collection Time: 06/30/14  5:40 AM  Result Value Ref Range   WBC 10.5 4.0 - 10.5 K/uL   RBC 3.23 (L) 4.22 - 5.81 MIL/uL   Hemoglobin 8.9 (L) 13.0 - 17.0 g/dL   HCT 29.3 (L) 39.0 - 52.0 %   MCV 90.7 78.0 - 100.0 fL   MCH 27.6 26.0 - 34.0 pg   MCHC 30.4 30.0 - 36.0 g/dL   RDW 15.3 11.5 - 15.5 %   Platelets 95 (L) 150 - 400 K/uL   Neutrophils Relative % 82 (H) 43 - 77 %   Lymphocytes Relative 10 (L) 12 - 46 %   Monocytes Relative 6 3 - 12 %   Eosinophils Relative 2 0 - 5 %   Basophils Relative 0 0 - 1 %   Neutro Abs 8.6 (H) 1.7 - 7.7 K/uL   Lymphs Abs 1.1 0.7 - 4.0 K/uL   Monocytes Absolute 0.6 0.1 - 1.0 K/uL   Eosinophils Absolute 0.2 0.0 - 0.7 K/uL   Basophils Absolute 0.0 0.0 - 0.1 K/uL   RBC Morphology ELLIPTOCYTES     Studies/Results: No results found.    Assessment: Anemia  Plan:   Per prior note from Dr. Cristina Gong, would not pursue further evaluation of the colon. Since he is stable I think he can be discharged home. He can follow-up with his PCP and have his hemoglobins checked periodically. Would send him on iron supplementation. We will sign off. Call us if needed.    Cassell Clement 06/30/2014, 8:31 AM  Pager: [phone] If  no answer or after 5 PM call [phone] Lab Results  Component Value Date   HGB 8.9* 06/30/2014   HGB 8.8* 06/29/2014   HGB 8.6* 06/28/2014   HCT 29.3* 06/30/2014   HCT 27.7* 06/29/2014   HCT 27.8* 06/28/2014   ALKPHOS 102 06/28/2014   ALKPHOS 107 06/27/2014   ALKPHOS 133* 06/26/2014   AST 16 06/28/2014   AST 21 06/27/2014   AST 54* 06/26/2014   ALT 52 06/28/2014   ALT 63 06/27/2014   ALT 93* 06/26/2014

## 2014-06-30 NOTE — Care Management (Signed)
Important Message  Patient Details  Name: Chase Johnson MRN: [id number] Date of Birth: 1930-05-26   Medicare Important Message Given:       Pricilla Handler 06/30/2014, 8:09 AM

## 2014-06-30 NOTE — Progress Notes (Signed)
Chase Johnson AOZ:[id number] DOB: [birth year] DOA: 06/25/2014 PCP: [name], PA-C  Brief narrative:  81 y/o ? htn, hld, CAD on plavix previosuly, PVD foll Dr. Nila Nephew showed bil;aterally occluded SFA's 12/2012,CKD c  Estimated Creatinine Clearance: 33.5 mL/min (by C-G formula based on Cr of 1.54).~ CKD stage 3,  2 hospitalizations ~ 1 month 5/30-->06/04/14 c PNA and COPD exacerb and also 06/11/14-->06/13/14 His wife lives at a nursing facility since 2012 [name] the cause of memory issues.  At baseline he typically ambulates with a walker and has had 2 falls in the recent month and a half He does not think that he has any problem with cough or cold after eating He states that he has had dark stools-"since been on all these medications" He tells me that he's been on oxygen since May of 2015? Patient did fair initally during hospital stay but was found to be hemoccult + and GI sae the patiemnt in consult and did an Endoscopy due to his h/o being on Plavix and found no source in Upper GI tract-His lower GI tract was felt to be too risky to assess with Colonoscopy and as his hemoglobin stabilized, fuirther interventionw as deferred He became progressively more SOB and CXr was perfromed and due to ? JVD an echo was ordered as it was felt he was at gihh risk for HF  Past medical history-As per Problem list Chart reviewed as below- reviewed  Consultants:   none  Procedures:  none  Antibiotics:  Ceftriaxone  Azithro   Subjective   Looks ill Sob Tired appearing No cp no sob No dark stool Non-compliant with wearing O2   Objective    Interim History:   Telemetry: PVC   Objective: Filed Vitals:   06/29/14 1931 06/29/14 2244 06/30/14 0437 06/30/14 0842  BP:  154/66 130/59   Pulse:  82 67   Temp:  97.8 F (36.6 C) 97.9 F (36.6 C)   TempSrc:  Oral Oral   Resp:  16 18   Height:      Weight:   64.139 kg (141 lb 6.4 oz)   SpO2: 97% 97% 100% 94%     Intake/Output Summary (Last 24 hours) at 06/30/14 0857 Last data filed at 06/30/14 0841  Gross per 24 hour  Intake    260 ml  Output   1150 ml  Net   -890 ml    Exam:  General: pleasant, arcus senilis Cardiovascular: s1 s 2no m/r/g  Reg rr, Jvd ? ?  Respiratory: clear no added sound Abdomen: soft nt nd no rebound no guardSkin no LE edema- of stasis dermatitis Neuro intact  Data Reviewed: Basic Metabolic Panel:  Recent Labs Lab 06/25/14 1912 06/26/14 0150 06/26/14 0708 06/27/14 0243 06/28/14 0258  NA 146* 148* 146* 139 142  K 4.8 4.8 4.7 4.0 3.8  CL 99* 96* 95* 95* 99*  CO2 43* 41* 38* 37* 36*  GLUCOSE 130* 109* 119* 136* 108*  BUN 17 21* 22* 31* 26*  CREATININE 1.33* 1.25* 1.25* 1.69* 1.54*  CALCIUM 9.0 9.5 9.2 8.2* 8.6*  MG  --  1.9  --   --   --    Liver Function Tests:  Recent Labs Lab 06/25/14 1912 06/26/14 0150 06/26/14 0708 06/27/14 0243 06/28/14 0258  AST 156* 85* 54* 21 16  ALT 116* 112* 93* 63 52  ALKPHOS 150* 156* 133* 107 102  BILITOT 0.9 0.8 0.5 0.1* 0.5  PROT 6.8 6.2* 5.7* 5.5* 5.1*  ALBUMIN  3.8 3.8 3.5 3.1* 3.0*   No results for input(s): LIPASE, AMYLASE in the last 168 hours. No results for input(s): AMMONIA in the last 168 hours. CBC:  Recent Labs Lab 06/25/14 1912 06/26/14 0150 06/26/14 0708 06/27/14 0243 06/27/14 1640 06/28/14 0258 06/29/14 0300 06/30/14 0540  WBC 14.4* 13.5* 12.5* 8.7 12.2* 10.0 9.8 10.5  NEUTROABS 13.6* 12.7* 11.9*  --   --   --  7.8* 8.6*  HGB 7.7* 7.5* 7.3* 6.9* 9.4* 8.6* 8.8* 8.9*  HCT 25.4* 24.8* 23.5* 22.0* 30.1* 27.8* 27.7* 29.3*  MCV 94.4 92.5 91.8 91.7 89.6 88.5 89.1 90.7  PLT 126* 89* 98* 92* 144* 132* 88* 95*   Cardiac Enzymes:  Recent Labs Lab 06/25/14 1912 06/26/14 0150  TROPONINI 0.03 <0.03   BNP: Invalid input(s): POCBNP CBG: No results for input(s): GLUCAP in the last 168 hours.  Recent Results (from the past 240 hour(s))  MRSA PCR Screening     Status: None   Collection  Time: 06/25/14 11:48 PM  Result Value Ref Range Status   MRSA by PCR NEGATIVE NEGATIVE Final    Comment:        The GeneXpert MRSA Assay (FDA approved for NASAL specimens only), is one component of a comprehensive MRSA colonization surveillance program. It is not intended to diagnose MRSA infection nor to guide or monitor treatment for MRSA infections.      Studies:              All Imaging reviewed and is as per above notation   Scheduled Meds: . amLODipine  10 mg Oral Daily  . atorvastatin  40 mg Oral Daily  . budesonide-formoterol  2 puff Inhalation BID  . clopidogrel  75 mg Oral Daily  . donepezil  5 mg Oral QHS  . ipratropium-albuterol  3 mL Nebulization TID  . metoprolol tartrate  37.5 mg Oral BID  . QUEtiapine  200 mg Oral QPM  . sodium chloride  3 mL Intravenous Q12H  . sucralfate  1 g Oral TID WC & HS  . tamsulosin  0.4 mg Oral Daily   Continuous Infusions:     Assessment/Plan: Present on Admission:    Acute respiratory failure with hypoxia and hypercarbia-etiology unclear. Felt to initially be 2/2 COPD exacerbation -no wheeze no rales no rhonchi  On admission pH 7.29, PCO2 98 but probably multifactorial-anemia blood loss , COPD, aspiration Needed bipap initally -Speech therapy saw patient-recommends dysphagia 3, thin liquids -patient noted to be SOB and had crackles on exam on 6/28 and ? JVD ? felt might have risk for Acute diastolic HF and CXR and ECHO ordered   GI bleed  -states he "has had 3 colonoscopies in the past"-I cannot find any records of the same.   Hb dropped to 6.9 and tx 1 u prbc.    -Hemoccult performed and was neg on 6/24 -[facility] performed endoscopy 6/26 which was neg -high risk for colonoscopy given respiratory issues -follow clinically.  Hemoglobin 6.9-->9.4 post tx-->8.6 -developed abd pain post procedure--given carafate.   Monitor   CAD (coronary artery disease) -held Plavix 6/25 given acute concern for bleed.  -I discussed  personally c GI-they recommned re-start Anti-plt agent as risks of CVA/CAV/PAD outweigh bleeding risk Can consider more beta selective beta blocker 37.5 metoprolol twice a day for now    AKI (acute kidney injury) +Hyperosmolality and hypernatremia probably secondary to volume depletion -monitor labs in am.  continue saline 75 cc/hr - Estimated Creatinine Clearance: 33.5 mL/min (  by C-G formula based on Cr of 1.54).    HTN (hypertension) -continue amlodipine 10 twice a day [unusual dose]    Peripheral arterial disease -balance risk and benefit of Plavix in this situation    Fall-multiple falls noted over the past month .  We will get therapy to evaluate the patient .  Patient has been recommended skilled nursing placement in the past -he wishes to go home and [facility] PT/OT has been recommended to him -he might be to frail to go home and PT/Ot requested to re-visit him on 6/28    Fecal occult blood test positive in the past -see above discussion   Hyperlipidemia -continue atorvastatin 40 daily    PTSD (post-traumatic stress disorder) -prior [employer] career with potential effects from this. Continue quetiapine 200 every afternoo  Code Status: DO NOT RESUSCITATE Famil communication:  [name],[name] Neighbor [phone]  [phone]  D/w the patient's HCPOA as above on 6/28 [place] is supposed to take him a memory care unit [facility] [phone] We will follow Disposition Plan: Follow and echo pending  Unclear dispo-might be able to d/c to [facility] 72-96 hr D/c tele   Verneita Griffes, MD  [facility] Pager [phone] 06/30/2014, 8:57 AM    LOS: 5 days

## 2014-07-01 ENCOUNTER — Other Ambulatory Visit (HOSPITAL_COMMUNITY): Payer: Medicare Other

## 2014-07-01 ENCOUNTER — Inpatient Hospital Stay (HOSPITAL_COMMUNITY): Payer: Medicare Other

## 2014-07-01 DIAGNOSIS — F4310 Post-traumatic stress disorder, unspecified: Secondary | ICD-10-CM

## 2014-07-01 DIAGNOSIS — I10 Essential (primary) hypertension: Secondary | ICD-10-CM

## 2014-07-01 DIAGNOSIS — J441 Chronic obstructive pulmonary disease with (acute) exacerbation: Principal | ICD-10-CM

## 2014-07-01 DIAGNOSIS — I509 Heart failure, unspecified: Secondary | ICD-10-CM

## 2014-07-01 DIAGNOSIS — D649 Anemia, unspecified: Secondary | ICD-10-CM

## 2014-07-01 DIAGNOSIS — N179 Acute kidney failure, unspecified: Secondary | ICD-10-CM

## 2014-07-01 DIAGNOSIS — J9601 Acute respiratory failure with hypoxia: Secondary | ICD-10-CM

## 2014-07-01 DIAGNOSIS — I739 Peripheral vascular disease, unspecified: Secondary | ICD-10-CM

## 2014-07-01 LAB — BASIC METABOLIC PANEL
Anion gap: 11 (ref 5–15)
BUN: 15 mg/dL (ref 6–20)
CALCIUM: 8.6 mg/dL — AB (ref 8.9–10.3)
CHLORIDE: 97 mmol/L — AB (ref 101–111)
CO2: 35 mmol/L — AB (ref 22–32)
Creatinine, Ser: 1.26 mg/dL — ABNORMAL HIGH (ref 0.61–1.24)
GFR calc Af Amer: 60 mL/min — ABNORMAL LOW (ref >60)
GFR calc non Af Amer: 51 mL/min — ABNORMAL LOW (ref >60)
Glucose, Bld: 102 mg/dL — ABNORMAL HIGH (ref 65–99)
Potassium: 4.1 mmol/L (ref 3.5–5.1)
SODIUM: 143 mmol/L (ref 135–145)

## 2014-07-01 LAB — CBC
HCT: 30.8 % — ABNORMAL LOW (ref 39.0–52.0)
Hemoglobin: 9.5 g/dL — ABNORMAL LOW (ref 13.0–17.0)
MCH: 27.7 pg (ref 26.0–34.0)
MCHC: 30.8 g/dL (ref 30.0–36.0)
MCV: 89.8 fL (ref 78.0–100.0)
PLATELETS: 103 10*3/uL — AB (ref 150–400)
RBC: 3.43 MIL/uL — AB (ref 4.22–5.81)
RDW: 15.2 % (ref 11.5–15.5)
WBC: 11.5 10*3/uL — ABNORMAL HIGH (ref 4.0–10.5)

## 2014-07-01 MED ORDER — SUCRALFATE 1 G PO TABS: 1.0000 g | ORAL_TABLET | Freq: Three times a day (TID) | ORAL | Status: AC

## 2014-07-01 NOTE — Clinical Social Work Note (Addendum)
Clinical Social Work Assessment  Patient Details  Name: Chase Chase Johnson MRN: [id number] Date of Birth: 1930/01/20  Date of referral:  07/01/14               Reason for consult:  [facility] Placement                Permission sought to share information with:  Other (Neighbor Chase Chase Johnson ) Permission granted to share information::     Name::     Chase Chase Johnson  Agency::     Relationship::  Friend and Administrator, arts Information:  [phone]  Housing/Transportation Living arrangements for the past 2 months:  [place] of Information:  Patient Patient Interpreter Needed:  None Criminal Activity/Legal Involvement Pertinent to Current Situation/Hospitalization:  No - Comment as needed Significant Relationships:  Friend Chase Chase Johnson) Lives with:  Friends Do you feel safe going back to the place where you live?  Yes Need for family participation in patient care:  Yes (Comment)  Care giving concerns:  None addressed by patient   Social Worker assessment / plan:  CSW talked with patient about discharge planning and recommendation by PT of ST rehab. Patient was sitting in a chair at bedside and throughout the conversation with CSW, kept his head down towards his lap. Patient agreeable to ST rehab and his preference is [facility] where is wife currently lives. His second choice is [facility]. Patient gave CSW permission to contact his friend, Chase Chase Johnson regarding SNF placement. When Chase Chase Johnson was asked about [facility] skilled [facility] (mentioned in MD's progress note) patient indicated that he had been there before and does not want to go there.  Chase Chase Johnson given skilled [facility] list which he studied for awhile, and SNF search process explained.  CSW talked with patient's POA - Chase Chase Johnson after 6:30 pm regarding skilled Chase Johnson placement. Chase Chase Johnson explained to patient that patient can no longer care for himself, and he had been utilizing patient's income to  pay for 24 hour care in the home as well as paying for his wife's care at [facility]. Per Chase Chase Johnson, he has been looking for placement for patient and has spoken with [facility], however they do not have availability; however [facility] skilled [facility] can take patient. Chase Chase Johnson reported that Chase Chase Johnson wants to move to [facility] to be with her husband. Chase Chase Johnson informed CSW that he will talk with patient about going to [facility] skilled nursing [facility].   Employment status:  Retired Health visitor, Managed Care PT Recommendations:  [place] / Referral to community resources:  Other (Comment Required) (None needed or requested at this time)  Patient/Family's Response to care:  Patient did not express any concerns regarding his care.  Patient/Family's Understanding of and Emotional Response to Diagnosis, Current Treatment, and Prognosis:  Not discussed.  Emotional Assessment Appearance:  Appears stated age Attitude/Demeanor/Rapport:  Guarded (Patient did not make eye contact with CSW during the assessment) Affect (typically observed):  Guarded, Blunt Orientation:  Oriented to Self, Oriented to Place, Oriented to Situation Alcohol / Substance use:  Tobacco Use (Patient reports that he quit smoking about 18 months ago. Patient reports that he does not drink alcohol or use illicit drugs.) Psych involvement (Current and /or in the community):  No (Comment)  Discharge Needs  Concerns to be addressed:  Discharge Planning Concerns Readmission within the last 30 days:  Yes Current discharge risk:  None Barriers to Discharge:  No Barriers Identified   Chase Feil, LCSW 07/01/2014, 6:13 PM

## 2014-07-01 NOTE — Progress Notes (Signed)
  Echocardiogram 2D Echocardiogram has been performed.  Chase Johnson 07/01/2014, 1:59 PM

## 2014-07-01 NOTE — Clinical Social Work Placement (Signed)
   CLINICAL SOCIAL WORK PLACEMENT  NOTE  Date:  07/01/2014  Patient Details  Name: Chase Johnson MRN: [id number] Date of Birth: [birth year]  Clinical Social Work is seeking post-discharge placement for this patient at the [place] level of care (*CSW will initial, date and re-position this form in  chart as items are completed):  Yes   Patient/family provided with [facility] Work Department's list of facilities offering this level of care within the geographic area requested by the patient (or if unable, by the patient's family).  Yes   Patient/family informed of their freedom to choose among providers that offer the needed level of care, that participate in Medicare, Medicaid or managed care program needed by the patient, have an available bed and are willing to accept the patient.  No   Patient/family informed of [facility]'s ownership interest in [facility] and [facility], as well as of the fact that they are under no obligation to receive care at these facilities.  PASRR submitted to EDS on       PASRR number received on 03/17/14     Existing PASRR number confirmed on 07/01/14     FL2 transmitted to all facilities in geographic area requested by pt/family on 07/01/14     FL2 transmitted to all facilities within larger geographic area on       Patient informed that his/her managed care company has contracts with or will negotiate with certain facilities, including the following:            Patient/family informed of bed offers received.  Patient chooses bed at       Physician recommends and patient chooses bed at      Patient to be transferred to   on  .  Patient to be transferred to facility by       Patient family notified on   of transfer.  Name of family member notified:        PHYSICIAN Please prepare priority discharge summary, including medications, Please sign FL2, Please sign DNR     Additional Comment:  07/01/14  - POA is [name] [phone]). [name] has found placement for patient at [facility] in [place], [place].   _______________________________________________ Sable Feil, LCSW 07/01/2014, 7:07 PM

## 2014-07-01 NOTE — Progress Notes (Signed)
[facility] PROGRESS NOTE  Chase Johnson HWE:[id number] DOB: [birth year] DOA: 06/25/2014 PCP: [name], PA-C  Assessment/Plan: #1acute respiratory failure with hypoxia and hypercarbia secondary to acute COPD exacerbation Clinical improvement. Patient back on 3 L nasal cannula as his home regimen. Patient was on steroids which were titrated down to off. Patient was also on antibiotics and nebulizer treatments. Patient had some complaints of shortness of breath yesterday however back to his baseline. Chest x-ray which was done was unremarkable. 2-D echo was done with a EF of 65-70% with no wall motion abnormalities grade 1 diastolic dysfunction. Continue current nebulizer treatments and oxygen. Follow.  #2 GI bleed Patient was noted to have a hemoglobin drop down to 6.9. Hemoccults which was performed on 6/24 was negative. Patient was seen in consultation by Dr. [name] of GI and underwent an upper endoscopy on 06/28/2014 which was negative for any source of bleeding. It was felt patient was high-risk for colonoscopy given his respiratory issues recommended to follow clinically. Patient had developed abdominal pain postprocedure Carafate was given with resolution of abdominal pain. Patient is status post 1 unit packed red blood cells with appropriate response.hemoglobin currently at 9.5.  #3 anemia See problem #2.patient will likely need to go home on oral iron supplementation.  #4 coronary artery disease Stable. 2-D echo done at [facility] of 65-70% with no wall motion abnormalities. Grade 1 diastolic dysfunction. Patient's Plavix was initially held due to concern for bleeding. Patient's Plavix has been resumed secondary to his wrists of CVA/PAD which way bleeding risk. Continue metoprolol,Lasix  #5 hypertension Stable. Continue current regimen of Norvasc, Lopressor,Lasix.  #6 hyperlipidemia Continue Lipitor.  #7peripheral arterial disease Stable. Continue Plavix.  #8 multiple  falls Patient has been seen by PT. Patient needs skilled nursing facility.  #9 PTSD Primary to urticaria with potential effects. Continue Seroquel.  #10 acute kidney injury Saline lock IV fluids. Follow.   Code Status: DNR  Family Communication: Updated patient. No family at bedside. Disposition Plan: SNF hopefully tomorrow.   Consultants:  Gastroenterology: Dr [name]: 06/27/14  Procedures:  CT head without contrast 06/25/2014  Chest x-ray 06/30/2014  Xray of L-spine 06/25/2014  X-ray of the ribs 06/25/2014  2-D echo 07/01/2014   1 unit packed red blood cells 06/27/2014  Antibiotics:  IV Rocephin  06/26/2014>>>> 06/28/2014  IV azithromycin 06/26/2014>>>> 06/28/2014  HPI/Subjective: Patient denies any shortness of breath. Patient denies any chest pain. Patient states his breathing is at baseline.  Objective: Filed Vitals:   07/01/14 0930  BP: 142/70  Pulse: 78  Temp: 98.7 F (37.1 C)  Resp: 18    Intake/Output Summary (Last 24 hours) at 07/01/14 1406 Last data filed at 07/01/14 0542  Gross per 24 hour  Intake    440 ml  Output    350 ml  Net     90 ml   Filed Weights   06/29/14 0547 06/30/14 0437 07/01/14 0540  Weight: 65.046 kg (143 lb 6.4 oz) 64.139 kg (141 lb 6.4 oz) 63.9 kg (140 lb 14 oz)    Exam:   General:  NAD  Cardiovascular: RRR  Respiratory: CTAB. FAIR AIR MOVEMENT  Abdomen: soft, nontender, nondistended, positive bowel sounds.  Musculoskeletal: no clubbing cyanosis or edema.  Data Reviewed: Basic Metabolic Panel:  Recent Labs Lab 06/26/14 0150 06/26/14 0708 06/27/14 0243 06/28/14 0258 07/01/14 0803  NA 148* 146* 139 142 143  K 4.8 4.7 4.0 3.8 4.1  CL 96* 95* 95* 99* 97*  CO2 41* 38* 37* 36*  35*  GLUCOSE 109* 119* 136* 108* 102*  BUN 21* 22* 31* 26* 15  CREATININE 1.25* 1.25* 1.69* 1.54* 1.26*  CALCIUM 9.5 9.2 8.2* 8.6* 8.6*  MG 1.9  --   --   --   --    Liver Function Tests:  Recent Labs Lab 06/25/14 1912  06/26/14 0150 06/26/14 0708 06/27/14 0243 06/28/14 0258  AST 156* 85* 54* 21 16  ALT 116* 112* 93* 63 52  ALKPHOS 150* 156* 133* 107 102  BILITOT 0.9 0.8 0.5 0.1* 0.5  PROT 6.8 6.2* 5.7* 5.5* 5.1*  ALBUMIN 3.8 3.8 3.5 3.1* 3.0*   No results for input(s): LIPASE, AMYLASE in the last 168 hours. No results for input(s): AMMONIA in the last 168 hours. CBC:  Recent Labs Lab 06/25/14 1912 06/26/14 0150 06/26/14 0708  06/27/14 1640 06/28/14 0258 06/29/14 0300 06/30/14 0540 07/01/14 0803  WBC 14.4* 13.5* 12.5*  < > 12.2* 10.0 9.8 10.5 11.5*  NEUTROABS 13.6* 12.7* 11.9*  --   --   --  7.8* 8.6*  --   HGB 7.7* 7.5* 7.3*  < > 9.4* 8.6* 8.8* 8.9* 9.5*  HCT 25.4* 24.8* 23.5*  < > 30.1* 27.8* 27.7* 29.3* 30.8*  MCV 94.4 92.5 91.8  < > 89.6 88.5 89.1 90.7 89.8  PLT 126* 89* 98*  < > 144* 132* 88* 95* 103*  < > = values in this interval not displayed. Cardiac Enzymes:  Recent Labs Lab 06/25/14 1912 06/26/14 0150  TROPONINI 0.03 <0.03   BNP (last 3 results)  Recent Labs  06/01/14 1943 06/11/14 0128 06/26/14 0150  BNP 835.0* 147.7* 313.0*    ProBNP (last 3 results) No results for input(s): PROBNP in the last 8760 hours.  CBG: No results for input(s): GLUCAP in the last 168 hours.  Recent Results (from the past 240 hour(s))  MRSA PCR Screening     Status: None   Collection Time: 06/25/14 11:48 PM  Result Value Ref Range Status   MRSA by PCR NEGATIVE NEGATIVE Final    Comment:        The GeneXpert MRSA Assay (FDA approved for NASAL specimens only), is one component of a comprehensive MRSA colonization surveillance program. It is not intended to diagnose MRSA infection nor to guide or monitor treatment for MRSA infections.      Studies: Dg Chest 2 View  06/30/2014   CLINICAL DATA:  Pleural effusion  EXAM: CHEST  2 VIEW  COMPARISON:  06/25/2014  FINDINGS: Cardiomediastinal silhouette is stable. There is small left pleural effusion with left basilar atelectasis.  No acute infiltrate or pulmonary edema. Mild hyperinflation again noted.  IMPRESSION: Mild hyperinflation again noted. Small left pleural effusion with left basilar atelectasis. No segmental infiltrate or pulmonary edema.   Electronically Signed   By: Lahoma Crocker M.D.   On: 06/30/2014 09:38    Scheduled Meds: . amLODipine  10 mg Oral Daily  . atorvastatin  40 mg Oral Daily  . budesonide-formoterol  2 puff Inhalation BID  . clopidogrel  75 mg Oral Daily  . donepezil  5 mg Oral QHS  . furosemide  20 mg Oral Daily  . ipratropium-albuterol  3 mL Nebulization TID  . metoprolol tartrate  37.5 mg Oral BID  . QUEtiapine  200 mg Oral QPM  . sodium chloride  3 mL Intravenous Q12H  . sucralfate  1 g Oral TID WC & HS  . tamsulosin  0.4 mg Oral Daily   Continuous Infusions:   Principal  Problem:   Acute respiratory failure with hypoxia and hypercarbia Active Problems:   Peripheral arterial disease   Hyperlipidemia   CAD (coronary artery disease)   HTN (hypertension)   PTSD (post-traumatic stress disorder)   COPD exacerbation   Fecal occult blood test positive   AKI (acute kidney injury)   Fall   Hyperosmolality and hypernatremia   Troponin level elevated    Time spent: [place] MD [facility] Pager [phone]. If 7PM-7AM, please contact night-coverage at [email or web address], password [facility] 07/01/2014, 2:06 PM  LOS: 6 days

## 2014-07-02 DIAGNOSIS — K922 Gastrointestinal hemorrhage, unspecified: Secondary | ICD-10-CM

## 2014-07-02 DIAGNOSIS — J9622 Acute and chronic respiratory failure with hypercapnia: Secondary | ICD-10-CM | POA: Diagnosis present

## 2014-07-02 LAB — BASIC METABOLIC PANEL
Anion gap: 6 (ref 5–15)
BUN: 15 mg/dL (ref 6–20)
CHLORIDE: 98 mmol/L — AB (ref 101–111)
CO2: 39 mmol/L — ABNORMAL HIGH (ref 22–32)
Calcium: 8.5 mg/dL — ABNORMAL LOW (ref 8.9–10.3)
Creatinine, Ser: 1.21 mg/dL (ref 0.61–1.24)
GFR calc Af Amer: >60 mL/min (ref >60)
GFR, EST NON AFRICAN AMERICAN: 54 mL/min — AB (ref >60)
Glucose, Bld: 107 mg/dL — ABNORMAL HIGH (ref 65–99)
Potassium: 3.8 mmol/L (ref 3.5–5.1)
Sodium: 143 mmol/L (ref 135–145)

## 2014-07-02 MED ORDER — FERROUS SULFATE 325 (65 FE) MG PO TABS: 325.0000 mg | ORAL_TABLET | Freq: Two times a day (BID) | ORAL | Status: AC

## 2014-07-02 NOTE — Clinical Social Work Note (Signed)
Clinical Social Worker continuing to follow patient and family for support and discharge planning needs.  CSW spoke with patient POA who is in agreement with placement at [facility] in [place].  CSW contacted facility who states that a bed will be available as early as 8am tomorrow morning.  CSW has updated MD, RN and patient POA of discharge plans.  CSW to facilitate patient discharge needs once bed available.  Barbette Or, [place]'

## 2014-07-02 NOTE — Progress Notes (Signed)
Patient worried about his breathing this AM.  Checked o2 saturation it was 100% on 3LNC.  Calmed him down with relaxation techniques.  Will continue to monitor.

## 2014-07-02 NOTE — Discharge Summary (Addendum)
Physician Discharge Summary  Chase Johnson ZOX:[id number] DOB: 1930-01-30 DOA: 06/25/2014  PCP: [name], PA-C  Admit date: 06/25/2014 Discharge date: 07/03/2014  Time spent: 65 minutes  Recommendations for Outpatient Follow-up:  1. Follow-up with M.D. at skilled nursing facility. Patient will need a CBC done in 1-2 weeks to follow-up on his hemoglobin, if patient's hemoglobin is less than 7 patient will likely require transfusion. Per GI patient is too high risk for colonoscopy and to transfuse as needed.  Discharge Diagnoses:  Principal Problem:   Acute on chronic respiratory failure with hypercapnia Active Problems:   COPD exacerbation   Peripheral arterial disease   Hyperlipidemia   CAD (coronary artery disease)   HTN (hypertension)   PTSD (post-traumatic stress disorder)   Fecal occult blood test positive   AKI (acute kidney injury)   Acute respiratory failure with hypoxia and hypercarbia   Fall   Hyperosmolality and hypernatremia   Troponin level elevated   Absolute anemia   Discharge Condition: Stable and improved  Diet recommendation: Heart healthy  Filed Weights   07/01/14 0540 07/02/14 0627 07/03/14 0447  Weight: 63.9 kg (140 lb 14 oz) 63.231 kg (139 lb 6.4 oz) 62.596 kg (138 lb)    History of present illness:  Per Dr Chase Johnson is a 81 y.o. male with Past medical history of coronary artery disease, COPD, hypertension, peripheral vascular disease, dyslipidemia. The patient presented with complaints of a fall. he reported the patient was walking he tripped over something and fell on the ground. There was no head injury had a headache or neck injury reported. He did have some pain on his right shoulder as per initial documentation with the patient was talking about left shoulder pain to admitting MD. He denied any fever or chills nausea vomiting diarrhea or constipation. Patient has baseline cognitive imbalance and therefore history was  limited. Patient mentioned he is taking all his medications. He denied any recent other falls. Also discussed the patient's neighbor has healthcare power of attorney Mr. Chase Johnson, he mentioned since last 2 days prior to admission, the patient has not been acting himself and has not been able to do his daily activity on his own. Patient also earlier also had a fall in which Mr. Chase Johnson had hard time lifting him up from the ground. Also the patient had 24/ 7 help available earlier at home but right now due to increasing cost of the services patient did not have that help recently.  The patient is coming from home. And at his baseline dependent for most of his ADL.   Hospital Course:  #1acute on chronic respiratory failure with hypoxia and hypercarbia secondary to acute COPD exacerbation Patient was admitted with acute respiratory failure with hypoxia and hypercarbia felt to be secondary to acute COPD exacerbation. Patient also noted to chronically be on 3 L nasal cannula. Patient was on steroids which were titrated down to off. Patient was also on antibiotics and nebulizer treatments. Patient had some complaints of shortness of breath however back to his baseline. Chest x-ray which was done was unremarkable. 2-D echo was done with a EF of 65-70% with no wall motion abnormalities grade 1 diastolic dysfunction. Patient improved clinically and was back to 3 L nasal cannula by day of discharge. Patient was back to his baseline and patient be discharged in stable and improved condition.  #2 GI bleed Patient was noted to have a hemoglobin drop down to 6.9. Hemoccults which was performed on 6/24 was negative. Patient  was seen in consultation by Dr. Oletta Johnson of GI and underwent an upper endoscopy on 06/28/2014 which was negative for any source of bleeding. It was felt patient was high-risk for colonoscopy given his respiratory issues recommended to follow clinically. Patient had developed abdominal pain  postprocedure Carafate was given with resolution of abdominal pain. Patient is status post 1 unit packed red blood cells with appropriate response.hemoglobin currently at 9.5. Patient may need transfusions as needed in the future.  #3 anemia See problem #2. Patient will be discharged on oral iron supplementation.  #4 coronary artery disease Stable. 2-D echo done at [facility] of 65-70% with no wall motion abnormalities. Grade 1 diastolic dysfunction. Patient's Plavix was initially held due to concern for bleeding. Patient's Plavix has been resumed secondary to his risks of CVA/PAD which outweigh bleeding risk. Patient was maintained on home regimen of metoprolol,Lasix.  #5 hypertension Stable. Patient was maintained on his home regimen of Norvasc, Lopressor,Lasix.  #6 hyperlipidemia Continued on Lipitor.  #7peripheral arterial disease Stable. Continued on Plavix.  #8 multiple falls Patient has been seen by PT. Patient needs skilled nursing facility.  #9 PTSD Prior Catering manager. Patient was maintained on his home regimen of Seroquel.  #10 acute kidney injury Resolved.   Procedures:  CT head without contrast 06/25/2014  Chest x-ray 06/30/2014  Xray of L-spine 06/25/2014  X-ray of the ribs 06/25/2014  2-D echo 07/01/2014   1 unit packed red blood cells 06/27/2014  Upper endoscopy 06/28/2014 Dr. Oletta Johnson  Consultations:  Gastroenterology: Dr Chase Johnson: 06/27/14  Discharge Exam: Filed Vitals:   07/03/14 0940  BP: 160/67  Pulse: 75  Temp: 98.3 F (36.8 C)  Resp: 20    General: NAD Cardiovascular: RRR Respiratory: CTAB  Discharge Instructions   Discharge Instructions    Diet - low sodium heart healthy    Complete by:  As directed      Discharge instructions    Complete by:  As directed   Follow up with MD at SNF     Increase activity slowly    Complete by:  As directed           Current Discharge Medication List    START taking these medications    Details  ferrous sulfate 325 (65 FE) MG tablet Take 1 tablet (325 mg total) by mouth 2 (two) times daily with a meal. Refills: 0    pantoprazole (PROTONIX) 40 MG tablet Take 1 tablet (40 mg total) by mouth daily. Qty: 60 tablet, Refills: 0    sorbitol 70 % SOLN Take 30 mLs by mouth daily as needed for moderate constipation. Qty: 1000 mL, Refills: 0    sucralfate (CARAFATE) 1 G tablet Take 1 tablet (1 g total) by mouth 4 (four) times daily -  with meals and at bedtime. Qty: 56 tablet, Refills: 0      CONTINUE these medications which have CHANGED   Details  metoprolol tartrate (LOPRESSOR) 50 MG tablet Take 1 tablet (50 mg total) by mouth 2 (two) times daily. Qty: 62 tablet, Refills: 0      CONTINUE these medications which have NOT CHANGED   Details  albuterol (PROVENTIL) (2.5 MG/3ML) 0.083% nebulizer solution Take 3 mLs (2.5 mg total) by nebulization every 4 (four) hours as needed for wheezing or shortness of breath. Qty: 30 vial, Refills: 0    amLODipine (NORVASC) 10 MG tablet Take 10 mg by mouth daily. Refills: 1    atorvastatin (LIPITOR) 40 MG tablet Take 40 mg by mouth  daily. Refills: 1    budesonide-formoterol (SYMBICORT) 160-4.5 MCG/ACT inhaler Inhale 2 puffs into the lungs 2 (two) times daily.    clopidogrel (PLAVIX) 75 MG tablet Take 75 mg by mouth daily. Refills: 1    donepezil (ARICEPT) 5 MG tablet Take 5 mg by mouth at bedtime. Refills: 1    furosemide (LASIX) 20 MG tablet Take 20 mg by mouth See admin instructions. Tues, Thurs, Sat, Sun    haloperidol (HALDOL) 5 MG tablet Take 5-10 mg by mouth every 6 (six) hours as needed for agitation.  Refills: 0    ipratropium-albuterol (DUONEB) 0.5-2.5 (3) MG/3ML SOLN Take 3 mLs by nebulization every 6 (six) hours as needed. Qty: 360 mL, Refills: 1    SEROQUEL XR 200 MG 24 hr tablet Take 200 mg by mouth every evening.  Refills: 3    tamsulosin (FLOMAX) 0.4 MG CAPS capsule Take 0.4 mg by mouth daily.       STOP  taking these medications     dextromethorphan-guaiFENesin (MUCINEX DM) 30-600 MG per 12 hr tablet      nitroGLYCERIN (NITROSTAT) 0.4 MG SL tablet      predniSONE (DELTASONE) 20 MG tablet      PROAIR HFA 108 (90 BASE) MCG/ACT inhaler        No Known Allergies Follow-up Information    Follow up with [name], PA-C. Schedule an appointment as soon as possible for a visit in 2 weeks.   Specialty:  Cardiology   Contact information:   [facility]  [address] [place] [address] [phone]       Please follow up.   Why:  f/u with MD at SNF       The results of significant diagnostics from this hospitalization (including imaging, microbiology, ancillary and laboratory) are listed below for reference.    Significant Diagnostic Studies: Dg Chest 2 View  06/30/2014   CLINICAL DATA:  Pleural effusion  EXAM: CHEST  2 VIEW  COMPARISON:  06/25/2014  FINDINGS: Cardiomediastinal silhouette is stable. There is small left pleural effusion with left basilar atelectasis. No acute infiltrate or pulmonary edema. Mild hyperinflation again noted.  IMPRESSION: Mild hyperinflation again noted. Small left pleural effusion with left basilar atelectasis. No segmental infiltrate or pulmonary edema.   Electronically Signed   By: Lahoma Crocker M.D.   On: 06/30/2014 09:38   Dg Ribs Unilateral W/chest Right  06/25/2014   CLINICAL DATA:  The patient tripped today. Right chest pain. Initial encounter.  EXAM: RIGHT RIBS AND CHEST - 3+ VIEW  COMPARISON:  None.  FINDINGS: No fracture or other bone lesions are seen involving the ribs. There is no evidence of pneumothorax. Very small left effusion is noted. Both lungs are clear. Heart size and mediastinal contours are within normal limits.  IMPRESSION: Negative for fracture.  Small left pleural effusion.   Electronically Signed   By: Inge Rise M.D.   On: 06/25/2014 18:18   Dg Lumbar Spine Complete  06/25/2014   CLINICAL DATA:  Acute lower  back pain after fall today. Initial encounter.  EXAM: LUMBAR SPINE - COMPLETE 4+ VIEW  COMPARISON:  None.  FINDINGS: No fracture or spondylolisthesis is noted. Moderate degenerative disc disease is noted at L3-4, L4-5 and L5-S1 with anterior osteophyte formation at these levels. Degenerative joint disease of posterior facet joints of lower lumbar spine are noted.  IMPRESSION: Multilevel degenerative disc disease. No acute abnormality seen in the lumbar spine.   Electronically Signed  By: Marijo Conception, M.D.   On: 06/25/2014 19:33   Ct Head Wo Contrast  06/25/2014   CLINICAL DATA:  Patient with recent fall and confusion. Patient fell through the door.  EXAM: CT HEAD WITHOUT CONTRAST  TECHNIQUE: Contiguous axial images were obtained from the base of the skull through the vertex without intravenous contrast.  COMPARISON:  Brain CT 06/11/2014  FINDINGS: Ventricles and sulci are appropriate for patient's age. Mild periventricular and subcortical Bulnes matter hypodensity compatible with chronic small vessel ischemic changes. No evidence for acute cortically based infarct, intracranial hemorrhage, mass lesion or mass effect. Mucosal thickening within the ethmoid air cells. Status post sinus surgery. Mastoid air cells are well aerated. Calvarium is intact.  IMPRESSION: No acute intracranial process.  Chronic small vessel ischemic changes.   Electronically Signed   By: Lovey Newcomer M.D.   On: 06/25/2014 19:58   Ct Head Wo Contrast  06/11/2014   CLINICAL DATA:  Weakness for 1 day  EXAM: CT HEAD WITHOUT CONTRAST  TECHNIQUE: Contiguous axial images were obtained from the base of the skull through the vertex without intravenous contrast.  COMPARISON:  MRI 03/14/2014  FINDINGS: There is no intracranial hemorrhage, mass or evidence of acute infarction. There is Tocci matter hypodensity in both cerebral hemispheres, likely due to chronic small vessel ischemic disease. There is mild generalized atrophy. There is no bony  abnormality. There is prior sinonasal surgery on the right with the visible paranasal sinuses being clear.  IMPRESSION: No acute findings. Generalized atrophy and moderate chronic small vessel ischemic disease.   Electronically Signed   By: Andreas Newport M.D.   On: 06/11/2014 06:11   Dg Chest Port 1 View  06/17/2014   CLINICAL DATA:  Shortness of breath tonight.  EXAM: PORTABLE CHEST - 1 VIEW  COMPARISON:  06/11/2014  FINDINGS: Emphysematous changes in the lungs. Normal heart size and pulmonary vascularity. No focal airspace disease or consolidation in the lungs. No blunting of costophrenic angles. No pneumothorax. Mediastinal contours appear intact.  IMPRESSION: Emphysematous changes in the lungs. No evidence of active pulmonary disease.   Electronically Signed   By: Lucienne Capers M.D.   On: 06/17/2014 02:41   Dg Chest Port 1 View  06/11/2014   CLINICAL DATA:  Dyspnea  EXAM: PORTABLE CHEST - 1 VIEW  COMPARISON:  06/01/2014  FINDINGS: Bullous emphysematous changes are present in the upper lobes. No pneumothorax is evident. No superimposed acute infiltrate or CHF is evident. Heart size is normal, unchanged. Pulmonary vasculature is normal. There is no large effusion.  IMPRESSION: Severe bullous emphysematous changes without evidence of an acute superimposed process.   Electronically Signed   By: Andreas Newport M.D.   On: 06/11/2014 01:32    Microbiology: Recent Results (from the past 240 hour(s))  MRSA PCR Screening     Status: None   Collection Time: 06/25/14 11:48 PM  Result Value Ref Range Status   MRSA by PCR NEGATIVE NEGATIVE Final    Comment:        The GeneXpert MRSA Assay (FDA approved for NASAL specimens only), is one component of a comprehensive MRSA colonization surveillance program. It is not intended to diagnose MRSA infection nor to guide or monitor treatment for MRSA infections.      Labs: Basic Metabolic Panel:  Recent Labs Lab 06/27/14 0243 06/28/14 0258  07/01/14 0803 07/02/14 0800 07/03/14 0826  NA 139 142 143 143 144  K 4.0 3.8 4.1 3.8 4.1  CL 95* 99*  97* 98* 97*  CO2 37* 36* 35* 39* 40*  GLUCOSE 136* 108* 102* 107* 106*  BUN 31* 26* 15 15 12   CREATININE 1.69* 1.54* 1.26* 1.21 1.29*  CALCIUM 8.2* 8.6* 8.6* 8.5* 8.5*   Liver Function Tests:  Recent Labs Lab 06/27/14 0243 06/28/14 0258  AST 21 16  ALT 63 52  ALKPHOS 107 102  BILITOT 0.1* 0.5  PROT 5.5* 5.1*  ALBUMIN 3.1* 3.0*   No results for input(s): LIPASE, AMYLASE in the last 168 hours. No results for input(s): AMMONIA in the last 168 hours. CBC:  Recent Labs Lab 06/28/14 0258 06/29/14 0300 06/30/14 0540 07/01/14 0803 07/03/14 0826  WBC 10.0 9.8 10.5 11.5* 9.0  NEUTROABS  --  7.8* 8.6*  --   --   HGB 8.6* 8.8* 8.9* 9.5* 9.2*  HCT 27.8* 27.7* 29.3* 30.8* 30.2*  MCV 88.5 89.1 90.7 89.8 90.7  PLT 132* 88* 95* 103* 107*   Cardiac Enzymes: No results for input(s): CKTOTAL, CKMB, CKMBINDEX, TROPONINI in the last 168 hours. BNP: BNP (last 3 results)  Recent Labs  06/01/14 1943 06/11/14 0128 06/26/14 0150  BNP 835.0* 147.7* 313.0*    ProBNP (last 3 results) No results for input(s): PROBNP in the last 8760 hours.  CBG: No results for input(s): GLUCAP in the last 168 hours.     SignedIrine Seal MD  [facility] 07/03/2014, 11:01 AM

## 2014-07-02 NOTE — Progress Notes (Signed)
Physical Therapy Treatment Patient Details Name: Chase Johnson MRN: [id number] DOB: 09/10/1930 Today's Date: 07/02/2014    History of Present Illness Chase Johnson is a 81 y.o. male with Past medical history of coronary artery disease, COPD, hypertension, peripheral vascular disease, dyslipidemia. Presented to ED after a fall    PT Comments    Pt with decreased safety awareness and poor insight with leaving RW in room.  Without RW, he ambulates with shuffled gait pattern.  D/c plan is now SNF per notes.  Follow Up Recommendations  SNF     Equipment Recommendations  None recommended by PT    Recommendations for Other Services       Precautions / Restrictions Precautions Precautions: Fall Restrictions Weight Bearing Restrictions: No    Mobility  Bed Mobility Overal bed mobility: Modified Independent       Supine to sit: Modified independent (Device/Increase time)        Transfers Overall transfer level: Needs assistance Equipment used: Rolling walker (2 wheeled) Transfers: Sit to/from Stand Sit to Stand: Supervision;Min assist         General transfer comment:  (Stood from bed with S, but low toilet with MIN A.)  Ambulation/Gait Ambulation/Gait assistance: Min guard Ambulation Distance (Feet): 150 Feet (x2) Assistive device: Rolling walker (2 wheeled) Gait Pattern/deviations: Step-through pattern;Trunk flexed Gait velocity: decreased   General Gait Details: Pt with decreased safety awareness leaving RW in room and then with decreased balance.  With cueing, he would return to RW. slow processing noted.   Stairs            Wheelchair Mobility    Modified Rankin (Stroke Patients Only)       Balance   Sitting-balance support: Feet supported Sitting balance-Leahy Scale: Good     Standing balance support: No upper extremity supported Standing balance-Leahy Scale: Fair Standing balance comment: Able to stand without UE assist, but  decreased balance with decreased step length                    Cognition Arousal/Alertness: Awake/alert Behavior During Therapy: Flat affect Overall Cognitive Status: No family/caregiver present to determine baseline cognitive functioning Area of Impairment: Safety/judgement;Problem solving     Memory: Decreased short-term memory   Safety/Judgement: Decreased awareness of deficits;Decreased awareness of safety   Problem Solving: Slow processing General Comments: delayed processing. (hearing phone ring several times then being cued to pick up)    Exercises      General Comments        Pertinent Vitals/Pain Pain Assessment: 0-10 Pain Score: 6  Pain Location: abd pain Pain Descriptors / Indicators: Aching Pain Intervention(s): Patient requesting pain meds-RN notified    Home Living                      Prior Function            PT Goals (current goals can now be found in the care plan section) Acute Rehab PT Goals PT Goal Formulation: With patient Time For Goal Achievement: 07/12/14 Potential to Achieve Goals: Good Progress towards PT goals: Progressing toward goals    Frequency  Min 3X/week    PT Plan Discharge plan needs to be updated    Co-evaluation             End of Session Equipment Utilized During Treatment: Gait belt;Oxygen Activity Tolerance: Patient tolerated treatment well Patient left: in chair;with call bell/phone within reach;with chair alarm set  Time: 3435-6861 PT Time Calculation (min) (ACUTE ONLY): 23 min  Charges:  $Gait Training: 23-37 mins                    G Codes:      Chase Johnson 07/02/2014, 1:10 PM

## 2014-07-02 NOTE — Care Management (Signed)
Important Message  Patient Details  Name: Chase Johnson MRN: [id number] Date of Birth: July 06, 1930   Medicare Important Message Given:  Yes-third notification given    Pricilla Handler 07/02/2014, 2:47 PM

## 2014-07-03 DIAGNOSIS — I129 Hypertensive chronic kidney disease with stage 1 through stage 4 chronic kidney disease, or unspecified chronic kidney disease: Secondary | ICD-10-CM | POA: Diagnosis present

## 2014-07-03 DIAGNOSIS — D62 Acute posthemorrhagic anemia: Secondary | ICD-10-CM | POA: Diagnosis present

## 2014-07-03 DIAGNOSIS — D500 Iron deficiency anemia secondary to blood loss (chronic): Secondary | ICD-10-CM | POA: Diagnosis present

## 2014-07-03 DIAGNOSIS — E43 Unspecified severe protein-calorie malnutrition: Secondary | ICD-10-CM | POA: Diagnosis present

## 2014-07-03 DIAGNOSIS — Z79899 Other long term (current) drug therapy: Secondary | ICD-10-CM | POA: Diagnosis not present

## 2014-07-03 DIAGNOSIS — N281 Cyst of kidney, acquired: Secondary | ICD-10-CM | POA: Diagnosis not present

## 2014-07-03 DIAGNOSIS — M6281 Muscle weakness (generalized): Secondary | ICD-10-CM | POA: Diagnosis not present

## 2014-07-03 DIAGNOSIS — E785 Hyperlipidemia, unspecified: Secondary | ICD-10-CM | POA: Diagnosis present

## 2014-07-03 DIAGNOSIS — K219 Gastro-esophageal reflux disease without esophagitis: Secondary | ICD-10-CM | POA: Diagnosis present

## 2014-07-03 DIAGNOSIS — I739 Peripheral vascular disease, unspecified: Secondary | ICD-10-CM | POA: Diagnosis present

## 2014-07-03 DIAGNOSIS — K5730 Diverticulosis of large intestine without perforation or abscess without bleeding: Secondary | ICD-10-CM | POA: Diagnosis present

## 2014-07-03 DIAGNOSIS — R109 Unspecified abdominal pain: Secondary | ICD-10-CM | POA: Diagnosis not present

## 2014-07-03 DIAGNOSIS — Z9181 History of falling: Secondary | ICD-10-CM | POA: Diagnosis not present

## 2014-07-03 DIAGNOSIS — K922 Gastrointestinal hemorrhage, unspecified: Secondary | ICD-10-CM | POA: Insufficient documentation

## 2014-07-03 DIAGNOSIS — E870 Hyperosmolality and hypernatremia: Secondary | ICD-10-CM | POA: Diagnosis present

## 2014-07-03 DIAGNOSIS — L8993 Pressure ulcer of unspecified site, stage 3: Secondary | ICD-10-CM | POA: Diagnosis present

## 2014-07-03 DIAGNOSIS — F4310 Post-traumatic stress disorder, unspecified: Secondary | ICD-10-CM | POA: Diagnosis present

## 2014-07-03 DIAGNOSIS — I10 Essential (primary) hypertension: Secondary | ICD-10-CM | POA: Diagnosis not present

## 2014-07-03 DIAGNOSIS — K2970 Gastritis, unspecified, without bleeding: Secondary | ICD-10-CM | POA: Diagnosis present

## 2014-07-03 DIAGNOSIS — J441 Chronic obstructive pulmonary disease with (acute) exacerbation: Secondary | ICD-10-CM | POA: Diagnosis present

## 2014-07-03 DIAGNOSIS — Z87891 Personal history of nicotine dependence: Secondary | ICD-10-CM | POA: Diagnosis not present

## 2014-07-03 DIAGNOSIS — Z8701 Personal history of pneumonia (recurrent): Secondary | ICD-10-CM | POA: Diagnosis not present

## 2014-07-03 DIAGNOSIS — R0600 Dyspnea, unspecified: Secondary | ICD-10-CM | POA: Diagnosis not present

## 2014-07-03 DIAGNOSIS — D72829 Elevated white blood cell count, unspecified: Secondary | ICD-10-CM | POA: Diagnosis present

## 2014-07-03 DIAGNOSIS — R069 Unspecified abnormalities of breathing: Secondary | ICD-10-CM | POA: Diagnosis not present

## 2014-07-03 DIAGNOSIS — N189 Chronic kidney disease, unspecified: Secondary | ICD-10-CM | POA: Diagnosis present

## 2014-07-03 DIAGNOSIS — J449 Chronic obstructive pulmonary disease, unspecified: Secondary | ICD-10-CM | POA: Diagnosis not present

## 2014-07-03 DIAGNOSIS — M6259 Muscle wasting and atrophy, not elsewhere classified, multiple sites: Secondary | ICD-10-CM | POA: Diagnosis not present

## 2014-07-03 DIAGNOSIS — N183 Chronic kidney disease, stage 3 (moderate): Secondary | ICD-10-CM | POA: Diagnosis present

## 2014-07-03 DIAGNOSIS — J90 Pleural effusion, not elsewhere classified: Secondary | ICD-10-CM | POA: Diagnosis not present

## 2014-07-03 DIAGNOSIS — I2510 Atherosclerotic heart disease of native coronary artery without angina pectoris: Secondary | ICD-10-CM | POA: Diagnosis not present

## 2014-07-03 DIAGNOSIS — Z8673 Personal history of transient ischemic attack (TIA), and cerebral infarction without residual deficits: Secondary | ICD-10-CM | POA: Diagnosis not present

## 2014-07-03 DIAGNOSIS — D649 Anemia, unspecified: Secondary | ICD-10-CM | POA: Diagnosis not present

## 2014-07-03 DIAGNOSIS — J439 Emphysema, unspecified: Secondary | ICD-10-CM | POA: Diagnosis not present

## 2014-07-03 DIAGNOSIS — J9610 Chronic respiratory failure, unspecified whether with hypoxia or hypercapnia: Secondary | ICD-10-CM | POA: Diagnosis not present

## 2014-07-03 DIAGNOSIS — R489 Unspecified symbolic dysfunctions: Secondary | ICD-10-CM | POA: Diagnosis not present

## 2014-07-03 DIAGNOSIS — I509 Heart failure, unspecified: Secondary | ICD-10-CM | POA: Diagnosis present

## 2014-07-03 DIAGNOSIS — J9622 Acute and chronic respiratory failure with hypercapnia: Secondary | ICD-10-CM | POA: Diagnosis not present

## 2014-07-03 DIAGNOSIS — D631 Anemia in chronic kidney disease: Secondary | ICD-10-CM | POA: Diagnosis not present

## 2014-07-03 DIAGNOSIS — N400 Enlarged prostate without lower urinary tract symptoms: Secondary | ICD-10-CM | POA: Diagnosis not present

## 2014-07-03 DIAGNOSIS — G4751 Confusional arousals: Secondary | ICD-10-CM | POA: Diagnosis not present

## 2014-07-03 DIAGNOSIS — G8929 Other chronic pain: Secondary | ICD-10-CM | POA: Diagnosis present

## 2014-07-03 DIAGNOSIS — D696 Thrombocytopenia, unspecified: Secondary | ICD-10-CM | POA: Diagnosis present

## 2014-07-03 DIAGNOSIS — I5030 Unspecified diastolic (congestive) heart failure: Secondary | ICD-10-CM | POA: Diagnosis present

## 2014-07-03 DIAGNOSIS — I82721 Chronic embolism and thrombosis of deep veins of right upper extremity: Secondary | ICD-10-CM | POA: Diagnosis present

## 2014-07-03 DIAGNOSIS — R918 Other nonspecific abnormal finding of lung field: Secondary | ICD-10-CM | POA: Diagnosis not present

## 2014-07-03 DIAGNOSIS — J9612 Chronic respiratory failure with hypercapnia: Secondary | ICD-10-CM | POA: Diagnosis not present

## 2014-07-03 DIAGNOSIS — K567 Ileus, unspecified: Secondary | ICD-10-CM | POA: Diagnosis not present

## 2014-07-03 DIAGNOSIS — R0689 Other abnormalities of breathing: Secondary | ICD-10-CM | POA: Diagnosis not present

## 2014-07-03 DIAGNOSIS — F419 Anxiety disorder, unspecified: Secondary | ICD-10-CM | POA: Diagnosis not present

## 2014-07-03 DIAGNOSIS — F4312 Post-traumatic stress disorder, chronic: Secondary | ICD-10-CM | POA: Diagnosis not present

## 2014-07-03 DIAGNOSIS — E46 Unspecified protein-calorie malnutrition: Secondary | ICD-10-CM | POA: Diagnosis not present

## 2014-07-03 DIAGNOSIS — K31811 Angiodysplasia of stomach and duodenum with bleeding: Secondary | ICD-10-CM | POA: Diagnosis present

## 2014-07-03 DIAGNOSIS — R0602 Shortness of breath: Secondary | ICD-10-CM | POA: Diagnosis not present

## 2014-07-03 DIAGNOSIS — M1990 Unspecified osteoarthritis, unspecified site: Secondary | ICD-10-CM | POA: Diagnosis not present

## 2014-07-03 DIAGNOSIS — R262 Difficulty in walking, not elsewhere classified: Secondary | ICD-10-CM | POA: Diagnosis not present

## 2014-07-03 DIAGNOSIS — I272 Other secondary pulmonary hypertension: Secondary | ICD-10-CM | POA: Diagnosis present

## 2014-07-03 DIAGNOSIS — Z7951 Long term (current) use of inhaled steroids: Secondary | ICD-10-CM | POA: Diagnosis not present

## 2014-07-03 DIAGNOSIS — J9601 Acute respiratory failure with hypoxia: Secondary | ICD-10-CM | POA: Diagnosis not present

## 2014-07-03 DIAGNOSIS — R10814 Left lower quadrant abdominal tenderness: Secondary | ICD-10-CM | POA: Diagnosis not present

## 2014-07-03 DIAGNOSIS — E860 Dehydration: Secondary | ICD-10-CM | POA: Diagnosis not present

## 2014-07-03 DIAGNOSIS — Z8659 Personal history of other mental and behavioral disorders: Secondary | ICD-10-CM | POA: Diagnosis not present

## 2014-07-03 DIAGNOSIS — I209 Angina pectoris, unspecified: Secondary | ICD-10-CM | POA: Diagnosis not present

## 2014-07-03 LAB — CBC
HEMATOCRIT: 30.2 % — AB (ref 39.0–52.0)
Hemoglobin: 9.2 g/dL — ABNORMAL LOW (ref 13.0–17.0)
MCH: 27.6 pg (ref 26.0–34.0)
MCHC: 30.5 g/dL (ref 30.0–36.0)
MCV: 90.7 fL (ref 78.0–100.0)
Platelets: 107 10*3/uL — ABNORMAL LOW (ref 150–400)
RBC: 3.33 MIL/uL — ABNORMAL LOW (ref 4.22–5.81)
RDW: 14.8 % (ref 11.5–15.5)
WBC: 9 10*3/uL (ref 4.0–10.5)

## 2014-07-03 LAB — BASIC METABOLIC PANEL
ANION GAP: 7 (ref 5–15)
BUN: 12 mg/dL (ref 6–20)
CHLORIDE: 97 mmol/L — AB (ref 101–111)
CO2: 40 mmol/L — ABNORMAL HIGH (ref 22–32)
CREATININE: 1.29 mg/dL — AB (ref 0.61–1.24)
Calcium: 8.5 mg/dL — ABNORMAL LOW (ref 8.9–10.3)
GFR calc Af Amer: 58 mL/min — ABNORMAL LOW (ref >60)
GFR calc non Af Amer: 50 mL/min — ABNORMAL LOW (ref >60)
Glucose, Bld: 106 mg/dL — ABNORMAL HIGH (ref 65–99)
POTASSIUM: 4.1 mmol/L (ref 3.5–5.1)
Sodium: 144 mmol/L (ref 135–145)

## 2014-07-03 MED ORDER — METOPROLOL TARTRATE 50 MG PO TABS: 50.0000 mg | ORAL_TABLET | Freq: Two times a day (BID) | ORAL | Status: DC

## 2014-07-03 MED ORDER — METOPROLOL TARTRATE 50 MG PO TABS
50.0000 mg | ORAL_TABLET | Freq: Two times a day (BID) | ORAL | Status: DC
  Administered 2014-07-03: 50 mg via ORAL
  Filled 2014-07-03 (×2): qty 1

## 2014-07-03 NOTE — Progress Notes (Signed)
1130 to [facility] rehab via [facility]

## 2014-07-03 NOTE — Progress Notes (Signed)
1158 report given Chase Johnson

## 2014-07-03 NOTE — Progress Notes (Signed)
[facility] PROGRESS NOTE  Edan XxxWhite QVZ:[id number] DOB: 1930-04-04 DOA: 06/25/2014 PCP: [name], PA-C  No significant change to discharge summary dictated on 07/02/2014. For hospitalization please see discharge summary dated 07/02/2014.  Assessment/Plan: #1acute respiratory failure with hypoxia and hypercarbia secondary to acute COPD exacerbation Clinical improvement. Patient back on 3 L nasal cannula as his home regimen. Patient was on steroids which were titrated down to off. Patient was also on antibiotics and nebulizer treatments. Patient had some complaints of shortness of breath yesterday however back to his baseline. Chest x-ray which was done was unremarkable. 2-D echo was done with a EF of 65-70% with no wall motion abnormalities grade 1 diastolic dysfunction. Continue current nebulizer treatments and oxygen. Follow.  #2 GI bleed Patient was noted to have a hemoglobin drop down to 6.9. Hemoccults which was performed on 6/24 was negative. Patient was seen in consultation by Dr. [name] of GI and underwent an upper endoscopy on 06/28/2014 which was negative for any source of bleeding. It was felt patient was high-risk for colonoscopy given his respiratory issues recommended to follow clinically. Patient had developed abdominal pain postprocedure Carafate was given with resolution of abdominal pain. Patient is status post 1 unit packed red blood cells with appropriate response. Hemoglobin currently at 9.2.  #3 anemia See problem #2. Patient will need oral iron supplementation on discharge.  #4 coronary artery disease Stable. 2-D echo done at [facility] of 65-70% with no wall motion abnormalities. Grade 1 diastolic dysfunction. Patient's Plavix was initially held due to concern for bleeding. Patient's Plavix has been resumed secondary to his risks of CVA/PAD which outweigh bleeding risk. Continue metoprolol,Lasix.  #5 hypertension Stable. Continue current regimen of Norvasc,  Lasix. Increase Lopressor to 50 mg by mouth twice a day.  #6 hyperlipidemia Continue Lipitor.  #7peripheral arterial disease Stable. Continue Plavix.  #8 multiple falls Patient has been seen by PT. Patient needs skilled nursing facility.  #9 PTSD Prior [employer] career with potential effects. Continue Seroquel.  #10 acute kidney injury Saline lock IV fluids. Follow.   Code Status: DNR  Family Communication: Updated patient. No family at bedside. Disposition Plan: SNF today.   Consultants:  Gastroenterology: Dr [name]: 06/27/14  Procedures:  CT head without contrast 06/25/2014  Chest x-ray 06/30/2014  Xray of L-spine 06/25/2014  X-ray of the ribs 06/25/2014  2-D echo 07/01/2014   1 unit packed red blood cells 06/27/2014  Antibiotics:  IV Rocephin  06/26/2014>>>> 06/28/2014  IV azithromycin 06/26/2014>>>> 06/28/2014  HPI/Subjective: Patient with no complaints. No SOB. No CP.  Objective: Filed Vitals:   07/03/14 0940  BP: 160/67  Pulse: 75  Temp: 98.3 F (36.8 C)  Resp: 20    Intake/Output Summary (Last 24 hours) at 07/03/14 1101 Last data filed at 07/03/14 0939  Gross per 24 hour  Intake    486 ml  Output    800 ml  Net   -314 ml   Filed Weights   07/01/14 0540 07/02/14 0627 07/03/14 0447  Weight: 63.9 kg (140 lb 14 oz) 63.231 kg (139 lb 6.4 oz) 62.596 kg (138 lb)    Exam:   General:  NAD  Cardiovascular: RRR  Respiratory: CTAB.   Abdomen: soft, nontender, nondistended, positive bowel sounds.  Musculoskeletal: no clubbing cyanosis or edema.  Data Reviewed: Basic Metabolic Panel:  Recent Labs Lab 06/27/14 0243 06/28/14 0258 07/01/14 0803 07/02/14 0800 07/03/14 0826  NA 139 142 143 143 144  K 4.0 3.8 4.1 3.8 4.1  CL 95*  99* 97* 98* 97*  CO2 37* 36* 35* 39* 40*  GLUCOSE 136* 108* 102* 107* 106*  BUN 31* 26* 15 15 12   CREATININE 1.69* 1.54* 1.26* 1.21 1.29*  CALCIUM 8.2* 8.6* 8.6* 8.5* 8.5*   Liver Function  Tests:  Recent Labs Lab 06/27/14 0243 06/28/14 0258  AST 21 16  ALT 63 52  ALKPHOS 107 102  BILITOT 0.1* 0.5  PROT 5.5* 5.1*  ALBUMIN 3.1* 3.0*   No results for input(s): LIPASE, AMYLASE in the last 168 hours. No results for input(s): AMMONIA in the last 168 hours. CBC:  Recent Labs Lab 06/28/14 0258 06/29/14 0300 06/30/14 0540 07/01/14 0803 07/03/14 0826  WBC 10.0 9.8 10.5 11.5* 9.0  NEUTROABS  --  7.8* 8.6*  --   --   HGB 8.6* 8.8* 8.9* 9.5* 9.2*  HCT 27.8* 27.7* 29.3* 30.8* 30.2*  MCV 88.5 89.1 90.7 89.8 90.7  PLT 132* 88* 95* 103* 107*   Cardiac Enzymes: No results for input(s): CKTOTAL, CKMB, CKMBINDEX, TROPONINI in the last 168 hours. BNP (last 3 results)  Recent Labs  06/01/14 1943 06/11/14 0128 06/26/14 0150  BNP 835.0* 147.7* 313.0*    ProBNP (last 3 results) No results for input(s): PROBNP in the last 8760 hours.  CBG: No results for input(s): GLUCAP in the last 168 hours.  Recent Results (from the past 240 hour(s))  MRSA PCR Screening     Status: None   Collection Time: 06/25/14 11:48 PM  Result Value Ref Range Status   MRSA by PCR NEGATIVE NEGATIVE Final    Comment:        The GeneXpert MRSA Assay (FDA approved for NASAL specimens only), is one component of a comprehensive MRSA colonization surveillance program. It is not intended to diagnose MRSA infection nor to guide or monitor treatment for MRSA infections.      Studies: No results found.  Scheduled Meds: . amLODipine  10 mg Oral Daily  . atorvastatin  40 mg Oral Daily  . budesonide-formoterol  2 puff Inhalation BID  . clopidogrel  75 mg Oral Daily  . donepezil  5 mg Oral QHS  . furosemide  20 mg Oral Daily  . ipratropium-albuterol  3 mL Nebulization TID  . metoprolol tartrate  50 mg Oral BID  . QUEtiapine  200 mg Oral QPM  . sodium chloride  3 mL Intravenous Q12H  . sucralfate  1 g Oral TID WC & HS  . tamsulosin  0.4 mg Oral Daily   Continuous Infusions:    Principal Problem:   Acute on chronic respiratory failure with hypercapnia Active Problems:   COPD exacerbation   Peripheral arterial disease   Hyperlipidemia   CAD (coronary artery disease)   HTN (hypertension)   PTSD (post-traumatic stress disorder)   Fecal occult blood test positive   AKI (acute kidney injury)   Acute respiratory failure with hypoxia and hypercarbia   Fall   Hyperosmolality and hypernatremia   Troponin level elevated   Absolute anemia    Time spent: [place] MD [facility] Pager [phone]. If 7PM-7AM, please contact night-coverage at [email or web address], password [facility] 07/03/2014, 11:01 AM  LOS: 8 days

## 2014-07-04 DIAGNOSIS — J9622 Acute and chronic respiratory failure with hypercapnia: Secondary | ICD-10-CM | POA: Diagnosis not present

## 2014-07-04 DIAGNOSIS — I82721 Chronic embolism and thrombosis of deep veins of right upper extremity: Secondary | ICD-10-CM | POA: Diagnosis not present

## 2014-07-04 DIAGNOSIS — I10 Essential (primary) hypertension: Secondary | ICD-10-CM | POA: Diagnosis not present

## 2014-07-04 DIAGNOSIS — J449 Chronic obstructive pulmonary disease, unspecified: Secondary | ICD-10-CM | POA: Diagnosis not present

## 2014-07-08 ENCOUNTER — Encounter (HOSPITAL_COMMUNITY): Payer: Self-pay | Admitting: Emergency Medicine

## 2014-07-08 ENCOUNTER — Emergency Department (HOSPITAL_COMMUNITY): Payer: Medicare Other

## 2014-07-08 ENCOUNTER — Emergency Department (HOSPITAL_COMMUNITY)
Admission: EM | Admit: 2014-07-08 | Discharge: 2014-07-08 | Disposition: A | Discharge status: Home / Self Care | Payer: Medicare Other | Attending: Emergency Medicine | Admitting: Emergency Medicine

## 2014-07-08 DIAGNOSIS — Z7951 Long term (current) use of inhaled steroids: Secondary | ICD-10-CM | POA: Insufficient documentation

## 2014-07-08 DIAGNOSIS — G4751 Confusional arousals: Secondary | ICD-10-CM | POA: Insufficient documentation

## 2014-07-08 DIAGNOSIS — Z8659 Personal history of other mental and behavioral disorders: Secondary | ICD-10-CM | POA: Insufficient documentation

## 2014-07-08 DIAGNOSIS — Z87891 Personal history of nicotine dependence: Secondary | ICD-10-CM | POA: Insufficient documentation

## 2014-07-08 DIAGNOSIS — I2510 Atherosclerotic heart disease of native coronary artery without angina pectoris: Secondary | ICD-10-CM | POA: Insufficient documentation

## 2014-07-08 DIAGNOSIS — R0602 Shortness of breath: Secondary | ICD-10-CM | POA: Diagnosis not present

## 2014-07-08 DIAGNOSIS — J441 Chronic obstructive pulmonary disease with (acute) exacerbation: Secondary | ICD-10-CM | POA: Diagnosis not present

## 2014-07-08 DIAGNOSIS — E785 Hyperlipidemia, unspecified: Secondary | ICD-10-CM | POA: Insufficient documentation

## 2014-07-08 DIAGNOSIS — N189 Chronic kidney disease, unspecified: Secondary | ICD-10-CM | POA: Insufficient documentation

## 2014-07-08 DIAGNOSIS — Z79899 Other long term (current) drug therapy: Secondary | ICD-10-CM | POA: Insufficient documentation

## 2014-07-08 DIAGNOSIS — I129 Hypertensive chronic kidney disease with stage 1 through stage 4 chronic kidney disease, or unspecified chronic kidney disease: Secondary | ICD-10-CM | POA: Diagnosis not present

## 2014-07-08 DIAGNOSIS — Z8701 Personal history of pneumonia (recurrent): Secondary | ICD-10-CM | POA: Insufficient documentation

## 2014-07-08 DIAGNOSIS — N400 Enlarged prostate without lower urinary tract symptoms: Secondary | ICD-10-CM | POA: Insufficient documentation

## 2014-07-08 DIAGNOSIS — J449 Chronic obstructive pulmonary disease, unspecified: Secondary | ICD-10-CM

## 2014-07-08 DIAGNOSIS — R0902 Hypoxemia: Secondary | ICD-10-CM

## 2014-07-08 LAB — CBC WITH DIFFERENTIAL/PLATELET
BASOS ABS: 0 10*3/uL (ref 0.0–0.1)
BASOS PCT: 0 % (ref 0–1)
EOS ABS: 0 10*3/uL (ref 0.0–0.7)
EOS PCT: 0 % (ref 0–5)
HCT: 32.3 % — ABNORMAL LOW (ref 39.0–52.0)
Hemoglobin: 9.7 g/dL — ABNORMAL LOW (ref 13.0–17.0)
LYMPHS ABS: 0.5 10*3/uL — AB (ref 0.7–4.0)
Lymphocytes Relative: 4 % — ABNORMAL LOW (ref 12–46)
MCH: 28 pg (ref 26.0–34.0)
MCHC: 30 g/dL (ref 30.0–36.0)
MCV: 93.1 fL (ref 78.0–100.0)
Monocytes Absolute: 0.7 10*3/uL (ref 0.1–1.0)
Monocytes Relative: 6 % (ref 3–12)
NEUTROS PCT: 90 % — AB (ref 43–77)
Neutro Abs: 11.4 10*3/uL — ABNORMAL HIGH (ref 1.7–7.7)
Platelets: 179 10*3/uL (ref 150–400)
RBC: 3.47 MIL/uL — ABNORMAL LOW (ref 4.22–5.81)
RDW: 14.3 % (ref 11.5–15.5)
WBC: 12.6 10*3/uL — AB (ref 4.0–10.5)

## 2014-07-08 LAB — I-STAT CHEM 8, ED
BUN: 22 mg/dL — AB (ref 6–20)
CALCIUM ION: 1.05 mmol/L — AB (ref 1.13–1.30)
CHLORIDE: 93 mmol/L — AB (ref 101–111)
CREATININE: 1.3 mg/dL — AB (ref 0.61–1.24)
Glucose, Bld: 103 mg/dL — ABNORMAL HIGH (ref 65–99)
HCT: 33 % — ABNORMAL LOW (ref 39.0–52.0)
Hemoglobin: 11.2 g/dL — ABNORMAL LOW (ref 13.0–17.0)
Potassium: 3.7 mmol/L (ref 3.5–5.1)
Sodium: 144 mmol/L (ref 135–145)
TCO2: 40 mmol/L (ref 0–100)

## 2014-07-08 LAB — I-STAT TROPONIN, ED: Troponin i, poc: 0 ng/mL (ref 0.00–0.08)

## 2014-07-08 NOTE — ED Provider Notes (Signed)
CSN: [id number]     Arrival date & time 07/08/14  2019 History   First MD Initiated Contact with Patient 07/08/14 2021     Chief Complaint  Patient presents with  . Respiratory Distress  . Pneumonia     (Consider location/radiation/quality/duration/timing/severity/associated sxs/prior Treatment) Patient is a 81 y.o. male presenting with pneumonia. The history is provided by the patient and the EMS personnel.  Pneumonia Associated symptoms include shortness of breath. Pertinent negatives include no chest pain, no abdominal pain and no headaches.   patient was transferred from nursing home with respiratory distress. Reported sats in the 70s. Per EMS patient was found to be on a nonrebreather mask however the oxygen was only at 3 L. Taken off the mask and put in on nasal cannula and oxygen improved. Reportedly had been diagnosed with pneumonia recently. Patient still somewhat fatigued. States he feels kind of weak all over. States she got home and not gone to the nursing home.  Past Medical History  Diagnosis Date  . CKD (chronic kidney disease)   . CAD (coronary artery disease)   . HTN (hypertension)   . PTSD (post-traumatic stress disorder)   . Tobacco abuse   . Hyperlipidemia   . COPD (chronic obstructive pulmonary disease)   . Peripheral arterial disease   . BPH (benign prostatic hyperplasia)    Past Surgical History  Procedure Laterality Date  . Prostate surgery      for BPH, but not know the detail by pt  . Esophagogastroduodenoscopy N/A 06/28/2014    Procedure: ESOPHAGOGASTRODUODENOSCOPY (EGD);  Surgeon: Laurence Spates, MD;  Location: [facility] ENDOSCOPY;  Service: Endoscopy;  Laterality: N/A;   Family History  Problem Relation Age of Onset  . Heart attack Father   . Hyperlipidemia Father   . Hypertension Father   . Stroke Father    History  Substance Use Topics  . Smoking status: Former Smoker    Types: Cigarettes    Quit date: 12/01/2012  . Smokeless tobacco: Not on file   . Alcohol Use: No    Review of Systems  Constitutional: Positive for fatigue. Negative for activity change and appetite change.  Eyes: Negative for pain.  Respiratory: Positive for cough and shortness of breath. Negative for chest tightness.   Cardiovascular: Negative for chest pain and leg swelling.  Gastrointestinal: Negative for abdominal pain and diarrhea.  Genitourinary: Negative for flank pain.  Musculoskeletal: Negative for back pain and neck stiffness.  Skin: Negative for rash.  Neurological: Negative for weakness, numbness and headaches.  Psychiatric/Behavioral: Positive for confusion. Negative for behavioral problems.      Allergies  Review of patient's allergies indicates no known allergies.  Home Medications   Prior to Admission medications   Medication Sig Start Date End Date Taking? Authorizing Provider  albuterol (PROVENTIL) (2.5 MG/3ML) 0.083% nebulizer solution Take 3 mLs (2.5 mg total) by nebulization every 4 (four) hours as needed for wheezing or shortness of breath. 06/04/14   Robbie Lis, MD  amLODipine (NORVASC) 10 MG tablet Take 10 mg by mouth daily. 05/20/14   Historical Provider, MD  atorvastatin (LIPITOR) 40 MG tablet Take 40 mg by mouth daily. 05/20/14   Historical Provider, MD  budesonide-formoterol (SYMBICORT) 160-4.5 MCG/ACT inhaler Inhale 2 puffs into the lungs 2 (two) times daily.    Historical Provider, MD  clopidogrel (PLAVIX) 75 MG tablet Take 75 mg by mouth daily. 05/20/14   Historical Provider, MD  donepezil (ARICEPT) 5 MG tablet Take 5 mg by mouth at  bedtime. 05/20/14   Historical Provider, MD  ferrous sulfate 325 (65 FE) MG tablet Take 1 tablet (325 mg total) by mouth 2 (two) times daily with a meal. 07/02/14   Eugenie Filler, MD  furosemide (LASIX) 20 MG tablet Take 20 mg by mouth See admin instructions. Tues, Thurs, Sat, Sun    Historical Provider, MD  haloperidol (HALDOL) 5 MG tablet Take 5-10 mg by mouth every 6 (six) hours as needed for  agitation.  05/27/14   Historical Provider, MD  ipratropium-albuterol (DUONEB) 0.5-2.5 (3) MG/3ML SOLN Take 3 mLs by nebulization every 6 (six) hours as needed. Patient taking differently: Take 3 mLs by nebulization every 6 (six) hours as needed (shortness of breath).  06/13/14   Hosie Poisson, MD  metoprolol tartrate (LOPRESSOR) 50 MG tablet Take 1 tablet (50 mg total) by mouth 2 (two) times daily. 07/03/14   Eugenie Filler, MD  pantoprazole (PROTONIX) 40 MG tablet Take 1 tablet (40 mg total) by mouth daily. 06/30/14   Nita Sells, MD  SEROQUEL XR 200 MG 24 hr tablet Take 200 mg by mouth every evening.  05/26/14   Historical Provider, MD  sorbitol 70 % SOLN Take 30 mLs by mouth daily as needed for moderate constipation. 06/30/14   Nita Sells, MD  sucralfate (CARAFATE) 1 G tablet Take 1 tablet (1 g total) by mouth 4 (four) times daily -  with meals and at bedtime. 07/01/14   Eugenie Filler, MD  tamsulosin (FLOMAX) 0.4 MG CAPS capsule Take 0.4 mg by mouth daily.     Historical Provider, MD   BP 150/65 mmHg  Pulse 89  Temp(Src) 98.4 F (36.9 C) (Oral)  Resp 16  Ht 6\' 1"  (1.854 m)  Wt 138 lb (62.596 kg)  BMI 18.21 kg/m2  SpO2 100% Physical Exam  Constitutional: He appears well-developed.  HENT:  Head: Normocephalic.  Neck: Neck supple.  Cardiovascular: Normal rate and regular rhythm.   Pulmonary/Chest: Effort normal.  Somewhat harsh breath sounds diffusely, worst the left base.  Abdominal: Soft.  Musculoskeletal: Normal range of motion.  Neurological: He is alert.  Somewhat sedate but awakens.  Skin: Skin is warm.    ED Course  Procedures (including critical care time) Labs Review Labs Reviewed  CBC WITH DIFFERENTIAL/PLATELET - Abnormal; Notable for the following:    WBC 12.6 (*)    RBC 3.47 (*)    Hemoglobin 9.7 (*)    HCT 32.3 (*)    Neutrophils Relative % 90 (*)    Neutro Abs 11.4 (*)    Lymphocytes Relative 4 (*)    Lymphs Abs 0.5 (*)    All other  components within normal limits  I-STAT CHEM 8, ED - Abnormal; Notable for the following:    Chloride 93 (*)    BUN 22 (*)    Creatinine, Ser 1.30 (*)    Glucose, Bld 103 (*)    Calcium, Ion 1.05 (*)    Hemoglobin 11.2 (*)    HCT 33.0 (*)    All other components within normal limits  I-STAT TROPOININ, ED    Imaging Review Dg Chest 2 View  07/08/2014   CLINICAL DATA:  Shortness of breath and weakness.  Symptoms today.  EXAM: CHEST  2 VIEW  COMPARISON:  Plain films of the chest 06/2024 08/2014. Single view of the chest 06/11/2014.  FINDINGS: The lungs are severely emphysematous. There are small bilateral pleural effusions which are not notably changed. The lungs are clear. Heart size is  normal.  IMPRESSION: Severe emphysema.  No change in small bilateral pleural effusions.   Electronically Signed   By: Inge Rise M.D.   On: 07/08/2014 21:25     EKG Interpretation   Date/Time:  Wednesday July 08 2014 20:33:37 EDT Ventricular Rate:  87 PR Interval:  117 QRS Duration: 81 QT Interval:  336 QTC Calculation: 404 R Axis:   89 Text Interpretation:  Sinus rhythm Multiform ventricular premature  complexes Borderline short PR interval Confirmed by Alvino Chapel  MD, Ovid Curd  [phone]) on 07/08/2014 9:11:53 PM      MDM   Final diagnoses:  Hypoxia  Chronic obstructive pulmonary disease, unspecified COPD, unspecified chronic bronchitis type    Patient sent from nursing home with hypoxia. Reportedly found to be on nonrebreather but at 3 L. Oxygen level came up. Patient is back at his baseline. X-ray does not show pneumonia. Will discharge home.    Davonna Belling, MD 07/08/14 2211

## 2014-07-08 NOTE — ED Notes (Addendum)
Per EMS- Pt here for [facility] and rehab in [place]. Pt was diagnosed with pneumonia today by chest xray at facility but has not received any breathing treatments or nebulizer's due to not having any orders yet.* per facility. Pt was noted to be 45% on room air. EMS placed pt on non-rebreather and patient went to 100%. Capnography was reading elevated at 83 so pt was placed on 2L and then cap was 26. No acute distress noted. No IV access.  *Pt facility reports pt is normally more alert and less lethargic and normally gets around with a cane. At present pt is lethargic and can barely hold his arms up.

## 2014-07-08 NOTE — ED Notes (Signed)
[facility] arrived.  

## 2014-07-08 NOTE — Discharge Instructions (Signed)
Continue your oxygen and treatments as needed.

## 2014-07-11 DIAGNOSIS — N189 Chronic kidney disease, unspecified: Secondary | ICD-10-CM | POA: Diagnosis present

## 2014-07-11 DIAGNOSIS — R262 Difficulty in walking, not elsewhere classified: Secondary | ICD-10-CM | POA: Diagnosis not present

## 2014-07-11 DIAGNOSIS — I129 Hypertensive chronic kidney disease with stage 1 through stage 4 chronic kidney disease, or unspecified chronic kidney disease: Secondary | ICD-10-CM | POA: Diagnosis not present

## 2014-07-11 DIAGNOSIS — K449 Diaphragmatic hernia without obstruction or gangrene: Secondary | ICD-10-CM | POA: Diagnosis not present

## 2014-07-11 DIAGNOSIS — I739 Peripheral vascular disease, unspecified: Secondary | ICD-10-CM | POA: Diagnosis present

## 2014-07-11 DIAGNOSIS — J449 Chronic obstructive pulmonary disease, unspecified: Secondary | ICD-10-CM | POA: Diagnosis not present

## 2014-07-11 DIAGNOSIS — R195 Other fecal abnormalities: Secondary | ICD-10-CM | POA: Diagnosis not present

## 2014-07-11 DIAGNOSIS — J432 Centrilobular emphysema: Secondary | ICD-10-CM | POA: Diagnosis not present

## 2014-07-11 DIAGNOSIS — K922 Gastrointestinal hemorrhage, unspecified: Secondary | ICD-10-CM | POA: Diagnosis not present

## 2014-07-11 DIAGNOSIS — K5730 Diverticulosis of large intestine without perforation or abscess without bleeding: Secondary | ICD-10-CM | POA: Diagnosis not present

## 2014-07-11 DIAGNOSIS — I5032 Chronic diastolic (congestive) heart failure: Secondary | ICD-10-CM | POA: Diagnosis not present

## 2014-07-11 DIAGNOSIS — I493 Ventricular premature depolarization: Secondary | ICD-10-CM | POA: Diagnosis not present

## 2014-07-11 DIAGNOSIS — I509 Heart failure, unspecified: Secondary | ICD-10-CM | POA: Diagnosis not present

## 2014-07-11 DIAGNOSIS — D500 Iron deficiency anemia secondary to blood loss (chronic): Secondary | ICD-10-CM | POA: Diagnosis not present

## 2014-07-11 DIAGNOSIS — J440 Chronic obstructive pulmonary disease with acute lower respiratory infection: Secondary | ICD-10-CM | POA: Diagnosis not present

## 2014-07-11 DIAGNOSIS — J9811 Atelectasis: Secondary | ICD-10-CM | POA: Diagnosis not present

## 2014-07-11 DIAGNOSIS — D62 Acute posthemorrhagic anemia: Secondary | ICD-10-CM | POA: Diagnosis not present

## 2014-07-11 DIAGNOSIS — D72829 Elevated white blood cell count, unspecified: Secondary | ICD-10-CM | POA: Diagnosis not present

## 2014-07-11 DIAGNOSIS — Z87891 Personal history of nicotine dependence: Secondary | ICD-10-CM | POA: Diagnosis not present

## 2014-07-11 DIAGNOSIS — F4310 Post-traumatic stress disorder, unspecified: Secondary | ICD-10-CM | POA: Diagnosis present

## 2014-07-11 DIAGNOSIS — E785 Hyperlipidemia, unspecified: Secondary | ICD-10-CM | POA: Diagnosis present

## 2014-07-11 DIAGNOSIS — D696 Thrombocytopenia, unspecified: Secondary | ICD-10-CM | POA: Diagnosis present

## 2014-07-11 DIAGNOSIS — E870 Hyperosmolality and hypernatremia: Secondary | ICD-10-CM | POA: Diagnosis not present

## 2014-07-11 DIAGNOSIS — J189 Pneumonia, unspecified organism: Secondary | ICD-10-CM | POA: Diagnosis not present

## 2014-07-11 DIAGNOSIS — Z741 Need for assistance with personal care: Secondary | ICD-10-CM | POA: Diagnosis not present

## 2014-07-11 DIAGNOSIS — J9622 Acute and chronic respiratory failure with hypercapnia: Secondary | ICD-10-CM | POA: Diagnosis not present

## 2014-07-11 DIAGNOSIS — R0600 Dyspnea, unspecified: Secondary | ICD-10-CM | POA: Diagnosis not present

## 2014-07-11 DIAGNOSIS — R000 Tachycardia, unspecified: Secondary | ICD-10-CM | POA: Diagnosis not present

## 2014-07-11 DIAGNOSIS — R600 Localized edema: Secondary | ICD-10-CM | POA: Diagnosis not present

## 2014-07-11 DIAGNOSIS — R109 Unspecified abdominal pain: Secondary | ICD-10-CM | POA: Diagnosis not present

## 2014-07-11 DIAGNOSIS — J90 Pleural effusion, not elsewhere classified: Secondary | ICD-10-CM | POA: Diagnosis not present

## 2014-07-11 DIAGNOSIS — J441 Chronic obstructive pulmonary disease with (acute) exacerbation: Secondary | ICD-10-CM | POA: Diagnosis not present

## 2014-07-11 DIAGNOSIS — G8929 Other chronic pain: Secondary | ICD-10-CM | POA: Diagnosis present

## 2014-07-11 DIAGNOSIS — R10814 Left lower quadrant abdominal tenderness: Secondary | ICD-10-CM | POA: Diagnosis not present

## 2014-07-11 DIAGNOSIS — E43 Unspecified severe protein-calorie malnutrition: Secondary | ICD-10-CM | POA: Diagnosis not present

## 2014-07-11 DIAGNOSIS — D649 Anemia, unspecified: Secondary | ICD-10-CM | POA: Diagnosis not present

## 2014-07-11 DIAGNOSIS — L8993 Pressure ulcer of unspecified site, stage 3: Secondary | ICD-10-CM | POA: Diagnosis not present

## 2014-07-11 DIAGNOSIS — R069 Unspecified abnormalities of breathing: Secondary | ICD-10-CM | POA: Diagnosis not present

## 2014-07-11 DIAGNOSIS — I82721 Chronic embolism and thrombosis of deep veins of right upper extremity: Secondary | ICD-10-CM | POA: Diagnosis present

## 2014-07-11 DIAGNOSIS — I5030 Unspecified diastolic (congestive) heart failure: Secondary | ICD-10-CM | POA: Diagnosis present

## 2014-07-11 DIAGNOSIS — N281 Cyst of kidney, acquired: Secondary | ICD-10-CM | POA: Diagnosis not present

## 2014-07-11 DIAGNOSIS — I272 Other secondary pulmonary hypertension: Secondary | ICD-10-CM | POA: Diagnosis present

## 2014-07-11 DIAGNOSIS — D122 Benign neoplasm of ascending colon: Secondary | ICD-10-CM | POA: Diagnosis not present

## 2014-07-11 DIAGNOSIS — D124 Benign neoplasm of descending colon: Secondary | ICD-10-CM | POA: Diagnosis not present

## 2014-07-11 DIAGNOSIS — I10 Essential (primary) hypertension: Secondary | ICD-10-CM | POA: Diagnosis not present

## 2014-07-11 DIAGNOSIS — R0689 Other abnormalities of breathing: Secondary | ICD-10-CM | POA: Diagnosis not present

## 2014-07-11 DIAGNOSIS — I5021 Acute systolic (congestive) heart failure: Secondary | ICD-10-CM | POA: Diagnosis not present

## 2014-07-11 DIAGNOSIS — N183 Chronic kidney disease, stage 3 (moderate): Secondary | ICD-10-CM | POA: Diagnosis not present

## 2014-07-11 DIAGNOSIS — R488 Other symbolic dysfunctions: Secondary | ICD-10-CM | POA: Diagnosis not present

## 2014-07-11 DIAGNOSIS — M6281 Muscle weakness (generalized): Secondary | ICD-10-CM | POA: Diagnosis not present

## 2014-07-11 DIAGNOSIS — R079 Chest pain, unspecified: Secondary | ICD-10-CM | POA: Diagnosis not present

## 2014-07-11 DIAGNOSIS — J439 Emphysema, unspecified: Secondary | ICD-10-CM | POA: Diagnosis not present

## 2014-07-11 DIAGNOSIS — K31811 Angiodysplasia of stomach and duodenum with bleeding: Secondary | ICD-10-CM | POA: Diagnosis not present

## 2014-07-11 DIAGNOSIS — E860 Dehydration: Secondary | ICD-10-CM | POA: Diagnosis not present

## 2014-07-11 DIAGNOSIS — N400 Enlarged prostate without lower urinary tract symptoms: Secondary | ICD-10-CM | POA: Diagnosis not present

## 2014-07-11 DIAGNOSIS — K2960 Other gastritis without bleeding: Secondary | ICD-10-CM | POA: Diagnosis not present

## 2014-07-11 DIAGNOSIS — K2970 Gastritis, unspecified, without bleeding: Secondary | ICD-10-CM | POA: Diagnosis not present

## 2014-07-11 DIAGNOSIS — K219 Gastro-esophageal reflux disease without esophagitis: Secondary | ICD-10-CM | POA: Diagnosis present

## 2014-07-11 DIAGNOSIS — J9612 Chronic respiratory failure with hypercapnia: Secondary | ICD-10-CM | POA: Diagnosis not present

## 2014-07-20 ENCOUNTER — Inpatient Hospital Stay: Payer: Medicare Other | Admitting: Internal Medicine

## 2014-07-23 DIAGNOSIS — M6250 Muscle wasting and atrophy, not elsewhere classified, unspecified site: Secondary | ICD-10-CM | POA: Diagnosis not present

## 2014-07-23 DIAGNOSIS — R600 Localized edema: Secondary | ICD-10-CM | POA: Diagnosis not present

## 2014-07-23 DIAGNOSIS — I739 Peripheral vascular disease, unspecified: Secondary | ICD-10-CM | POA: Diagnosis not present

## 2014-07-23 DIAGNOSIS — D649 Anemia, unspecified: Secondary | ICD-10-CM | POA: Diagnosis not present

## 2014-07-23 DIAGNOSIS — L03032 Cellulitis of left toe: Secondary | ICD-10-CM | POA: Diagnosis not present

## 2014-07-23 DIAGNOSIS — B351 Tinea unguium: Secondary | ICD-10-CM | POA: Diagnosis not present

## 2014-07-23 DIAGNOSIS — M79675 Pain in left toe(s): Secondary | ICD-10-CM | POA: Diagnosis not present

## 2014-07-23 DIAGNOSIS — D500 Iron deficiency anemia secondary to blood loss (chronic): Secondary | ICD-10-CM | POA: Diagnosis not present

## 2014-07-23 DIAGNOSIS — J9612 Chronic respiratory failure with hypercapnia: Secondary | ICD-10-CM | POA: Diagnosis not present

## 2014-07-23 DIAGNOSIS — J440 Chronic obstructive pulmonary disease with acute lower respiratory infection: Secondary | ICD-10-CM | POA: Diagnosis not present

## 2014-07-23 DIAGNOSIS — N189 Chronic kidney disease, unspecified: Secondary | ICD-10-CM | POA: Diagnosis not present

## 2014-07-23 DIAGNOSIS — D72829 Elevated white blood cell count, unspecified: Secondary | ICD-10-CM | POA: Diagnosis not present

## 2014-07-23 DIAGNOSIS — Z741 Need for assistance with personal care: Secondary | ICD-10-CM | POA: Diagnosis not present

## 2014-07-23 DIAGNOSIS — R488 Other symbolic dysfunctions: Secondary | ICD-10-CM | POA: Diagnosis not present

## 2014-07-23 DIAGNOSIS — J90 Pleural effusion, not elsewhere classified: Secondary | ICD-10-CM | POA: Diagnosis not present

## 2014-07-23 DIAGNOSIS — R262 Difficulty in walking, not elsewhere classified: Secondary | ICD-10-CM | POA: Diagnosis not present

## 2014-07-23 DIAGNOSIS — K922 Gastrointestinal hemorrhage, unspecified: Secondary | ICD-10-CM | POA: Diagnosis not present

## 2014-07-23 DIAGNOSIS — J432 Centrilobular emphysema: Secondary | ICD-10-CM | POA: Diagnosis not present

## 2014-07-23 DIAGNOSIS — N183 Chronic kidney disease, stage 3 (moderate): Secondary | ICD-10-CM | POA: Diagnosis not present

## 2014-07-23 DIAGNOSIS — D62 Acute posthemorrhagic anemia: Secondary | ICD-10-CM | POA: Diagnosis not present

## 2014-07-23 DIAGNOSIS — J449 Chronic obstructive pulmonary disease, unspecified: Secondary | ICD-10-CM | POA: Diagnosis not present

## 2014-07-23 DIAGNOSIS — I509 Heart failure, unspecified: Secondary | ICD-10-CM | POA: Diagnosis not present

## 2014-07-23 DIAGNOSIS — J441 Chronic obstructive pulmonary disease with (acute) exacerbation: Secondary | ICD-10-CM | POA: Diagnosis not present

## 2014-07-23 DIAGNOSIS — I129 Hypertensive chronic kidney disease with stage 1 through stage 4 chronic kidney disease, or unspecified chronic kidney disease: Secondary | ICD-10-CM | POA: Diagnosis not present

## 2014-07-23 DIAGNOSIS — F0390 Unspecified dementia without behavioral disturbance: Secondary | ICD-10-CM | POA: Diagnosis not present

## 2014-07-23 DIAGNOSIS — M6281 Muscle weakness (generalized): Secondary | ICD-10-CM | POA: Diagnosis not present

## 2014-07-23 DIAGNOSIS — M79674 Pain in right toe(s): Secondary | ICD-10-CM | POA: Diagnosis not present

## 2014-07-27 DIAGNOSIS — D649 Anemia, unspecified: Secondary | ICD-10-CM | POA: Diagnosis not present

## 2014-07-27 DIAGNOSIS — M6250 Muscle wasting and atrophy, not elsewhere classified, unspecified site: Secondary | ICD-10-CM | POA: Diagnosis not present

## 2014-07-27 DIAGNOSIS — N189 Chronic kidney disease, unspecified: Secondary | ICD-10-CM | POA: Diagnosis not present

## 2014-07-27 DIAGNOSIS — J449 Chronic obstructive pulmonary disease, unspecified: Secondary | ICD-10-CM | POA: Diagnosis not present

## 2014-08-24 DIAGNOSIS — D649 Anemia, unspecified: Secondary | ICD-10-CM | POA: Diagnosis not present

## 2014-08-24 DIAGNOSIS — J449 Chronic obstructive pulmonary disease, unspecified: Secondary | ICD-10-CM | POA: Diagnosis not present

## 2014-08-24 DIAGNOSIS — F0390 Unspecified dementia without behavioral disturbance: Secondary | ICD-10-CM | POA: Diagnosis not present

## 2014-08-24 DIAGNOSIS — N183 Chronic kidney disease, stage 3 (moderate): Secondary | ICD-10-CM | POA: Diagnosis not present

## 2014-08-26 DIAGNOSIS — B351 Tinea unguium: Secondary | ICD-10-CM | POA: Diagnosis not present

## 2014-08-26 DIAGNOSIS — M79674 Pain in right toe(s): Secondary | ICD-10-CM | POA: Diagnosis not present

## 2014-08-26 DIAGNOSIS — M79675 Pain in left toe(s): Secondary | ICD-10-CM | POA: Diagnosis not present

## 2014-08-26 DIAGNOSIS — L03032 Cellulitis of left toe: Secondary | ICD-10-CM | POA: Diagnosis not present

## 2014-08-26 DIAGNOSIS — I739 Peripheral vascular disease, unspecified: Secondary | ICD-10-CM | POA: Diagnosis not present

## 2014-08-28 DIAGNOSIS — R300 Dysuria: Secondary | ICD-10-CM | POA: Diagnosis not present

## 2014-09-02 DIAGNOSIS — I10 Essential (primary) hypertension: Secondary | ICD-10-CM | POA: Diagnosis not present

## 2014-10-14 DIAGNOSIS — Z23 Encounter for immunization: Secondary | ICD-10-CM | POA: Diagnosis not present

## 2014-11-23 DIAGNOSIS — I509 Heart failure, unspecified: Secondary | ICD-10-CM | POA: Diagnosis not present

## 2014-11-23 DIAGNOSIS — K219 Gastro-esophageal reflux disease without esophagitis: Secondary | ICD-10-CM | POA: Diagnosis not present

## 2014-11-23 DIAGNOSIS — I129 Hypertensive chronic kidney disease with stage 1 through stage 4 chronic kidney disease, or unspecified chronic kidney disease: Secondary | ICD-10-CM | POA: Diagnosis not present

## 2014-11-23 DIAGNOSIS — F0390 Unspecified dementia without behavioral disturbance: Secondary | ICD-10-CM | POA: Diagnosis not present

## 2014-11-25 DIAGNOSIS — M79674 Pain in right toe(s): Secondary | ICD-10-CM | POA: Diagnosis not present

## 2014-11-25 DIAGNOSIS — M79675 Pain in left toe(s): Secondary | ICD-10-CM | POA: Diagnosis not present

## 2014-11-25 DIAGNOSIS — I739 Peripheral vascular disease, unspecified: Secondary | ICD-10-CM | POA: Diagnosis not present

## 2014-11-25 DIAGNOSIS — B351 Tinea unguium: Secondary | ICD-10-CM | POA: Diagnosis not present

## 2015-01-11 DIAGNOSIS — E784 Other hyperlipidemia: Secondary | ICD-10-CM | POA: Diagnosis not present

## 2015-01-11 DIAGNOSIS — N189 Chronic kidney disease, unspecified: Secondary | ICD-10-CM | POA: Diagnosis not present

## 2015-01-11 DIAGNOSIS — I10 Essential (primary) hypertension: Secondary | ICD-10-CM | POA: Diagnosis not present

## 2015-01-20 DIAGNOSIS — J449 Chronic obstructive pulmonary disease, unspecified: Secondary | ICD-10-CM | POA: Diagnosis not present

## 2015-01-20 DIAGNOSIS — F0390 Unspecified dementia without behavioral disturbance: Secondary | ICD-10-CM | POA: Diagnosis not present

## 2015-01-20 DIAGNOSIS — J9610 Chronic respiratory failure, unspecified whether with hypoxia or hypercapnia: Secondary | ICD-10-CM | POA: Diagnosis not present

## 2015-01-20 DIAGNOSIS — N189 Chronic kidney disease, unspecified: Secondary | ICD-10-CM | POA: Diagnosis not present

## 2015-02-24 DIAGNOSIS — B351 Tinea unguium: Secondary | ICD-10-CM | POA: Diagnosis not present

## 2015-02-24 DIAGNOSIS — I739 Peripheral vascular disease, unspecified: Secondary | ICD-10-CM | POA: Diagnosis not present

## 2015-02-24 DIAGNOSIS — M79675 Pain in left toe(s): Secondary | ICD-10-CM | POA: Diagnosis not present

## 2015-02-24 DIAGNOSIS — M79674 Pain in right toe(s): Secondary | ICD-10-CM | POA: Diagnosis not present

## 2015-03-24 DIAGNOSIS — F0390 Unspecified dementia without behavioral disturbance: Secondary | ICD-10-CM | POA: Diagnosis not present

## 2015-03-24 DIAGNOSIS — I509 Heart failure, unspecified: Secondary | ICD-10-CM | POA: Diagnosis not present

## 2015-03-24 DIAGNOSIS — I10 Essential (primary) hypertension: Secondary | ICD-10-CM | POA: Diagnosis not present

## 2015-03-24 DIAGNOSIS — J9610 Chronic respiratory failure, unspecified whether with hypoxia or hypercapnia: Secondary | ICD-10-CM | POA: Diagnosis not present

## 2015-05-10 DIAGNOSIS — K219 Gastro-esophageal reflux disease without esophagitis: Secondary | ICD-10-CM | POA: Diagnosis not present

## 2015-05-10 DIAGNOSIS — I129 Hypertensive chronic kidney disease with stage 1 through stage 4 chronic kidney disease, or unspecified chronic kidney disease: Secondary | ICD-10-CM | POA: Diagnosis not present

## 2015-05-10 DIAGNOSIS — F0390 Unspecified dementia without behavioral disturbance: Secondary | ICD-10-CM | POA: Diagnosis not present

## 2015-05-10 DIAGNOSIS — I7389 Other specified peripheral vascular diseases: Secondary | ICD-10-CM | POA: Diagnosis not present

## 2015-06-02 DIAGNOSIS — M79674 Pain in right toe(s): Secondary | ICD-10-CM | POA: Diagnosis not present

## 2015-06-02 DIAGNOSIS — B351 Tinea unguium: Secondary | ICD-10-CM | POA: Diagnosis not present

## 2015-06-02 DIAGNOSIS — I739 Peripheral vascular disease, unspecified: Secondary | ICD-10-CM | POA: Diagnosis not present

## 2015-06-02 DIAGNOSIS — M79675 Pain in left toe(s): Secondary | ICD-10-CM | POA: Diagnosis not present

## 2015-07-05 ENCOUNTER — Encounter (HOSPITAL_COMMUNITY): Payer: Self-pay

## 2015-07-05 ENCOUNTER — Inpatient Hospital Stay (HOSPITAL_COMMUNITY)
Admission: EM | Admit: 2015-07-05 | Discharge: 2015-07-08 | DRG: 065 | Disposition: A | Discharge status: Skilled Nursing Facility | Payer: Medicare Other | Attending: Internal Medicine | Admitting: Internal Medicine

## 2015-07-05 ENCOUNTER — Emergency Department (HOSPITAL_COMMUNITY): Payer: Medicare Other

## 2015-07-05 ENCOUNTER — Inpatient Hospital Stay (HOSPITAL_COMMUNITY): Payer: Medicare Other

## 2015-07-05 DIAGNOSIS — D631 Anemia in chronic kidney disease: Secondary | ICD-10-CM | POA: Diagnosis present

## 2015-07-05 DIAGNOSIS — M6281 Muscle weakness (generalized): Secondary | ICD-10-CM | POA: Diagnosis not present

## 2015-07-05 DIAGNOSIS — N183 Chronic kidney disease, stage 3 (moderate): Secondary | ICD-10-CM | POA: Diagnosis present

## 2015-07-05 DIAGNOSIS — I639 Cerebral infarction, unspecified: Principal | ICD-10-CM | POA: Diagnosis present

## 2015-07-05 DIAGNOSIS — F4310 Post-traumatic stress disorder, unspecified: Secondary | ICD-10-CM | POA: Diagnosis present

## 2015-07-05 DIAGNOSIS — E785 Hyperlipidemia, unspecified: Secondary | ICD-10-CM | POA: Diagnosis not present

## 2015-07-05 DIAGNOSIS — R29810 Facial weakness: Secondary | ICD-10-CM | POA: Diagnosis present

## 2015-07-05 DIAGNOSIS — I638 Other cerebral infarction: Secondary | ICD-10-CM | POA: Diagnosis not present

## 2015-07-05 DIAGNOSIS — I10 Essential (primary) hypertension: Secondary | ICD-10-CM | POA: Diagnosis present

## 2015-07-05 DIAGNOSIS — G309 Alzheimer's disease, unspecified: Secondary | ICD-10-CM | POA: Diagnosis present

## 2015-07-05 DIAGNOSIS — I739 Peripheral vascular disease, unspecified: Secondary | ICD-10-CM | POA: Diagnosis present

## 2015-07-05 DIAGNOSIS — I6789 Other cerebrovascular disease: Secondary | ICD-10-CM | POA: Diagnosis not present

## 2015-07-05 DIAGNOSIS — Z87891 Personal history of nicotine dependence: Secondary | ICD-10-CM | POA: Diagnosis not present

## 2015-07-05 DIAGNOSIS — I2510 Atherosclerotic heart disease of native coronary artery without angina pectoris: Secondary | ICD-10-CM | POA: Diagnosis present

## 2015-07-05 DIAGNOSIS — R1310 Dysphagia, unspecified: Secondary | ICD-10-CM | POA: Diagnosis present

## 2015-07-05 DIAGNOSIS — J449 Chronic obstructive pulmonary disease, unspecified: Secondary | ICD-10-CM | POA: Diagnosis present

## 2015-07-05 DIAGNOSIS — R2681 Unsteadiness on feet: Secondary | ICD-10-CM | POA: Diagnosis not present

## 2015-07-05 DIAGNOSIS — R4781 Slurred speech: Secondary | ICD-10-CM | POA: Diagnosis not present

## 2015-07-05 DIAGNOSIS — N400 Enlarged prostate without lower urinary tract symptoms: Secondary | ICD-10-CM | POA: Diagnosis present

## 2015-07-05 DIAGNOSIS — K219 Gastro-esophageal reflux disease without esophagitis: Secondary | ICD-10-CM | POA: Diagnosis present

## 2015-07-05 DIAGNOSIS — R0603 Acute respiratory distress: Secondary | ICD-10-CM

## 2015-07-05 DIAGNOSIS — Z79899 Other long term (current) drug therapy: Secondary | ICD-10-CM

## 2015-07-05 DIAGNOSIS — E611 Iron deficiency: Secondary | ICD-10-CM | POA: Diagnosis present

## 2015-07-05 DIAGNOSIS — R471 Dysarthria and anarthria: Secondary | ICD-10-CM | POA: Diagnosis present

## 2015-07-05 DIAGNOSIS — R4701 Aphasia: Secondary | ICD-10-CM | POA: Diagnosis present

## 2015-07-05 DIAGNOSIS — I129 Hypertensive chronic kidney disease with stage 1 through stage 4 chronic kidney disease, or unspecified chronic kidney disease: Secondary | ICD-10-CM | POA: Diagnosis present

## 2015-07-05 DIAGNOSIS — F0281 Dementia in other diseases classified elsewhere with behavioral disturbance: Secondary | ICD-10-CM | POA: Diagnosis present

## 2015-07-05 DIAGNOSIS — G8929 Other chronic pain: Secondary | ICD-10-CM | POA: Diagnosis not present

## 2015-07-05 HISTORY — DX: Acute respiratory distress: R06.03

## 2015-07-05 HISTORY — DX: Dysarthria and anarthria: R47.1

## 2015-07-05 LAB — COMPREHENSIVE METABOLIC PANEL
ALT: 18 U/L (ref 17–63)
ANION GAP: 8 (ref 5–15)
AST: 32 U/L (ref 15–41)
Albumin: 4.4 g/dL (ref 3.5–5.0)
Alkaline Phosphatase: 60 U/L (ref 38–126)
BILIRUBIN TOTAL: 0.8 mg/dL (ref 0.3–1.2)
BUN: 25 mg/dL — ABNORMAL HIGH (ref 6–20)
CALCIUM: 9.7 mg/dL (ref 8.9–10.3)
CO2: 27 mmol/L (ref 22–32)
Chloride: 107 mmol/L (ref 101–111)
Creatinine, Ser: 1.56 mg/dL — ABNORMAL HIGH (ref 0.61–1.24)
GFR, EST AFRICAN AMERICAN: 46 mL/min — AB (ref >60)
GFR, EST NON AFRICAN AMERICAN: 39 mL/min — AB (ref >60)
Glucose, Bld: 100 mg/dL — ABNORMAL HIGH (ref 65–99)
POTASSIUM: 4.5 mmol/L (ref 3.5–5.1)
Sodium: 142 mmol/L (ref 135–145)
TOTAL PROTEIN: 7.5 g/dL (ref 6.5–8.1)

## 2015-07-05 LAB — CBC
HCT: 34.6 % — ABNORMAL LOW (ref 39.0–52.0)
HCT: 33.4 % — ABNORMAL LOW (ref 39.0–52.0)
Hemoglobin: 10.8 g/dL — ABNORMAL LOW (ref 13.0–17.0)
Hemoglobin: 10.4 g/dL — ABNORMAL LOW (ref 13.0–17.0)
MCH: 26.9 pg (ref 26.0–34.0)
MCH: 26.9 pg (ref 26.0–34.0)
MCHC: 31.2 g/dL (ref 30.0–36.0)
MCHC: 31.1 g/dL (ref 30.0–36.0)
MCV: 86.3 fL (ref 78.0–100.0)
MCV: 86.3 fL (ref 78.0–100.0)
Platelets: 249 10*3/uL (ref 150–400)
Platelets: 198 10*3/uL (ref 150–400)
RBC: 4.01 MIL/uL — ABNORMAL LOW (ref 4.22–5.81)
RBC: 3.87 MIL/uL — ABNORMAL LOW (ref 4.22–5.81)
RDW: 13.9 % (ref 11.5–15.5)
RDW: 14 % (ref 11.5–15.5)
WBC: 7.8 10*3/uL (ref 4.0–10.5)
WBC: 8.3 10*3/uL (ref 4.0–10.5)

## 2015-07-05 LAB — RAPID URINE DRUG SCREEN, HOSP PERFORMED
Amphetamines: NOT DETECTED
Barbiturates: NOT DETECTED
Benzodiazepines: NOT DETECTED
Cocaine: NOT DETECTED
OPIATES: NOT DETECTED
Tetrahydrocannabinol: NOT DETECTED

## 2015-07-05 LAB — URINALYSIS, ROUTINE W REFLEX MICROSCOPIC
Bilirubin Urine: NEGATIVE
GLUCOSE, UA: NEGATIVE mg/dL
Hgb urine dipstick: NEGATIVE
Ketones, ur: NEGATIVE mg/dL
LEUKOCYTES UA: NEGATIVE
NITRITE: NEGATIVE
PROTEIN: NEGATIVE mg/dL
Specific Gravity, Urine: 1.012 (ref 1.005–1.030)
pH: 6 (ref 5.0–8.0)

## 2015-07-05 LAB — I-STAT CHEM 8, ED
BUN: 34 mg/dL — AB (ref 6–20)
CHLORIDE: 103 mmol/L (ref 101–111)
Calcium, Ion: 1.18 mmol/L (ref 1.12–1.23)
Creatinine, Ser: 1.6 mg/dL — ABNORMAL HIGH (ref 0.61–1.24)
GLUCOSE: 98 mg/dL (ref 65–99)
HCT: 35 % — ABNORMAL LOW (ref 39.0–52.0)
Hemoglobin: 11.9 g/dL — ABNORMAL LOW (ref 13.0–17.0)
Potassium: 4.3 mmol/L (ref 3.5–5.1)
Sodium: 145 mmol/L (ref 135–145)
TCO2: 32 mmol/L (ref 0–100)

## 2015-07-05 LAB — ETHANOL: Alcohol, Ethyl (B): <5 mg/dL (ref <5)

## 2015-07-05 LAB — DIFFERENTIAL
Basophils Absolute: 0 10*3/uL (ref 0.0–0.1)
Basophils Relative: 0 %
EOS ABS: 0.2 10*3/uL (ref 0.0–0.7)
Eosinophils Relative: 2 %
Lymphocytes Relative: 17 %
Lymphs Abs: 1.3 10*3/uL (ref 0.7–4.0)
MONO ABS: 0.6 10*3/uL (ref 0.1–1.0)
MONOS PCT: 8 %
NEUTROS PCT: 73 %
Neutro Abs: 5.7 10*3/uL (ref 1.7–7.7)

## 2015-07-05 LAB — PROTIME-INR
INR: 1.12 (ref 0.00–1.49)
PROTHROMBIN TIME: 14.6 s (ref 11.6–15.2)

## 2015-07-05 LAB — MAGNESIUM: Magnesium: 1.9 mg/dL (ref 1.7–2.4)

## 2015-07-05 LAB — CREATININE, SERUM
Creatinine, Ser: 1.51 mg/dL — ABNORMAL HIGH (ref 0.61–1.24)
GFR calc Af Amer: 47 mL/min — ABNORMAL LOW (ref >60)
GFR calc non Af Amer: 41 mL/min — ABNORMAL LOW (ref >60)

## 2015-07-05 LAB — PHOSPHORUS: Phosphorus: 3 mg/dL (ref 2.5–4.6)

## 2015-07-05 LAB — I-STAT TROPONIN, ED: TROPONIN I, POC: 0 ng/mL (ref 0.00–0.08)

## 2015-07-05 LAB — APTT: aPTT: 28 seconds (ref 24–37)

## 2015-07-05 MED ORDER — SUCRALFATE 1 G PO TABS
1.0000 g | ORAL_TABLET | Freq: Three times a day (TID) | ORAL | Status: DC
  Administered 2015-07-06 – 2015-07-08 (×4): 1 g via ORAL
  Filled 2015-07-05 (×5): qty 1

## 2015-07-05 MED ORDER — AMLODIPINE BESYLATE 10 MG PO TABS: 10.0000 mg | ORAL_TABLET | Freq: Every day | ORAL | Status: DC

## 2015-07-05 MED ORDER — DONEPEZIL HCL 5 MG PO TABS
5.0000 mg | ORAL_TABLET | Freq: Every day | ORAL | Status: DC
  Administered 2015-07-06 – 2015-07-07 (×2): 5 mg via ORAL
  Filled 2015-07-05 (×2): qty 1

## 2015-07-05 MED ORDER — IPRATROPIUM BROMIDE 0.02 % IN SOLN: 0.5000 mg | RESPIRATORY_TRACT | Status: DC

## 2015-07-05 MED ORDER — PANTOPRAZOLE SODIUM 40 MG PO TBEC
40.0000 mg | DELAYED_RELEASE_TABLET | Freq: Every day | ORAL | Status: DC
  Administered 2015-07-06 – 2015-07-08 (×3): 40 mg via ORAL
  Filled 2015-07-05 (×3): qty 1

## 2015-07-05 MED ORDER — ALBUTEROL SULFATE (2.5 MG/3ML) 0.083% IN NEBU: 2.5000 mg | INHALATION_SOLUTION | Freq: Two times a day (BID) | RESPIRATORY_TRACT | Status: DC

## 2015-07-05 MED ORDER — MELATONIN 3 MG PO CAPS: 3.0000 mg | ORAL_CAPSULE | Freq: Every evening | ORAL | Status: DC

## 2015-07-05 MED ORDER — HYDRALAZINE HCL 20 MG/ML IJ SOLN
10.0000 mg | INTRAMUSCULAR | Status: DC | PRN
Start: 2015-07-05 — End: 2015-07-08

## 2015-07-05 MED ORDER — METOPROLOL TARTRATE 12.5 MG HALF TABLET
12.5000 mg | ORAL_TABLET | Freq: Two times a day (BID) | ORAL | Status: DC
  Administered 2015-07-06 – 2015-07-08 (×4): 12.5 mg via ORAL
  Filled 2015-07-05 (×5): qty 1

## 2015-07-05 MED ORDER — ASPIRIN EC 325 MG PO TBEC
325.0000 mg | DELAYED_RELEASE_TABLET | Freq: Every day | ORAL | Status: DC
  Administered 2015-07-06 – 2015-07-08 (×3): 325 mg via ORAL
  Filled 2015-07-05 (×3): qty 1

## 2015-07-05 MED ORDER — ENOXAPARIN SODIUM 30 MG/0.3ML ~~LOC~~ SOLN
30.0000 mg | SUBCUTANEOUS | Status: DC
  Administered 2015-07-06 – 2015-07-07 (×2): 30 mg via SUBCUTANEOUS
  Filled 2015-07-05 (×2): qty 0.3

## 2015-07-05 MED ORDER — IPRATROPIUM BROMIDE 0.02 % IN SOLN: 0.5000 mg | RESPIRATORY_TRACT | Status: DC | PRN

## 2015-07-05 MED ORDER — TAMSULOSIN HCL 0.4 MG PO CAPS
0.4000 mg | ORAL_CAPSULE | Freq: Every day | ORAL | Status: DC
  Administered 2015-07-06 – 2015-07-08 (×3): 0.4 mg via ORAL
  Filled 2015-07-05 (×3): qty 1

## 2015-07-05 MED ORDER — QUETIAPINE FUMARATE 50 MG PO TABS
100.0000 mg | ORAL_TABLET | Freq: Every day | ORAL | Status: DC
  Administered 2015-07-06 – 2015-07-07 (×2): 100 mg via ORAL
  Filled 2015-07-05 (×2): qty 2

## 2015-07-05 MED ORDER — ATORVASTATIN CALCIUM 40 MG PO TABS
40.0000 mg | ORAL_TABLET | Freq: Every day | ORAL | Status: DC
  Administered 2015-07-06 – 2015-07-08 (×4): 40 mg via ORAL
  Filled 2015-07-05 (×3): qty 1

## 2015-07-05 MED ORDER — STROKE: EARLY STAGES OF RECOVERY BOOK: Freq: Once | Status: DC

## 2015-07-05 MED ORDER — ALBUTEROL SULFATE (2.5 MG/3ML) 0.083% IN NEBU
2.5000 mg | INHALATION_SOLUTION | Freq: Three times a day (TID) | RESPIRATORY_TRACT | Status: DC
  Administered 2015-07-05 – 2015-07-07 (×4): 2.5 mg via RESPIRATORY_TRACT
  Filled 2015-07-05 (×5): qty 3

## 2015-07-05 MED ORDER — ALBUTEROL SULFATE (2.5 MG/3ML) 0.083% IN NEBU: 2.5000 mg | INHALATION_SOLUTION | RESPIRATORY_TRACT | Status: DC | PRN

## 2015-07-05 MED ORDER — SODIUM CHLORIDE 0.9 % IV SOLN: INTRAVENOUS | Status: DC

## 2015-07-05 MED ORDER — FERROUS SULFATE 325 (65 FE) MG PO TABS
325.0000 mg | ORAL_TABLET | Freq: Two times a day (BID) | ORAL | Status: DC
  Administered 2015-07-06 – 2015-07-08 (×4): 325 mg via ORAL
  Filled 2015-07-05 (×5): qty 1

## 2015-07-05 MED ORDER — MOMETASONE FURO-FORMOTEROL FUM 100-5 MCG/ACT IN AERO
2.0000 | INHALATION_SPRAY | Freq: Two times a day (BID) | RESPIRATORY_TRACT | Status: DC
  Administered 2015-07-05 – 2015-07-08 (×5): 2 via RESPIRATORY_TRACT
  Filled 2015-07-05: qty 8.8

## 2015-07-05 NOTE — ED Notes (Signed)
Chase Johnson, [place] can be reached at [phone]

## 2015-07-05 NOTE — Consult Note (Signed)
Requesting Physician: Dr. Alvino Chapel    Chief Complaint: CVA  History obtained from:  Patient   Chart    HPI:                                                                                                                                         Chase Johnson is an 82 y.o. male who resides ina nursing home and was noted to have dysarthric speech yesterday. HE was brought to hospital today due to not improving. He states he became very upset yesterday and suddenly noted he had slurred speech. Currently he want to leave but understands he likely had a CVA.   Date last known well: Yesterday Time last known well: Unable to determine tPA Given: No: No known LSN   Past Medical History  Diagnosis Date  . CKD (chronic kidney disease)   . CAD (coronary artery disease)   . HTN (hypertension)   . PTSD (post-traumatic stress disorder)   . Tobacco abuse   . Hyperlipidemia   . COPD (chronic obstructive pulmonary disease) ([place])   . Peripheral arterial disease ([place])   . BPH (benign prostatic hyperplasia)     Past Surgical History  Procedure Laterality Date  . Prostate surgery      for BPH, but not know the detail by pt  . Esophagogastroduodenoscopy N/A 06/28/2014    Procedure: ESOPHAGOGASTRODUODENOSCOPY (EGD);  Surgeon: Laurence Spates, MD;  Location: [facility] ENDOSCOPY;  Service: Endoscopy;  Laterality: N/A;    Family History  Problem Relation Age of Onset  . Heart attack Father   . Hyperlipidemia Father   . Hypertension Father   . Stroke Father    Social History:  reports that he quit smoking about 2 years ago. His smoking use included Cigarettes. He does not have any smokeless tobacco history on file. He reports that he does not drink alcohol. His drug history is not on file.  Allergies: No Known Allergies  Medications:                                                                                                                           No current facility-administered medications for  this encounter.   Current Outpatient Prescriptions  Medication Sig Dispense Refill  . albuterol (PROVENTIL) (2.5 MG/3ML) 0.083% nebulizer solution Take 3 mLs (2.5 mg total) by nebulization every 4 (four) hours as  needed for wheezing or shortness of breath. 30 vial 0  . Amino Acids-Protein Hydrolys (FEEDING SUPPLEMENT, PRO-STAT SUGAR FREE 64,) LIQD Take 30 mLs by mouth 2 (two) times daily.    Marland Kitchen amLODipine (NORVASC) 10 MG tablet Take 10 mg by mouth daily.  1  . atorvastatin (LIPITOR) 40 MG tablet Take 40 mg by mouth daily.  1  . donepezil (ARICEPT) 5 MG tablet Take 5 mg by mouth at bedtime.  1  . ferrous sulfate 325 (65 FE) MG tablet Take 1 tablet (325 mg total) by mouth 2 (two) times daily with a meal.  0  . Fluticasone-Salmeterol (ADVAIR) 100-50 MCG/DOSE AEPB Inhale 1 puff into the lungs 2 (two) times daily.    . furosemide (LASIX) 20 MG tablet Take 10 mg by mouth daily.     Marland Kitchen ipratropium (ATROVENT) 0.02 % nebulizer solution Take 0.5 mg by nebulization every 4 (four) hours.    . Melatonin 3 MG CAPS Take 3 mg by mouth every evening.    . metoprolol tartrate (LOPRESSOR) 25 MG tablet Take 12.5 mg by mouth 2 (two) times daily.    . pantoprazole (PROTONIX) 40 MG tablet Take 1 tablet (40 mg total) by mouth daily. 60 tablet 0  . potassium chloride (K-DUR) 10 MEQ tablet Take 10 mEq by mouth daily.    Marland Kitchen PRESCRIPTION MEDICATION Take 120 mLs by mouth 2 (two) times daily. Med[employer]    . QUEtiapine (SEROQUEL) 100 MG tablet Take 100 mg by mouth at bedtime.    . sorbitol 70 % SOLN Take 30 mLs by mouth daily as needed for moderate constipation. (Patient taking differently: Take 30 mLs by mouth daily. ) 1000 mL 0  . sucralfate (CARAFATE) 1 G tablet Take 1 tablet (1 g total) by mouth 4 (four) times daily -  with meals and at bedtime. (Patient taking differently: Take 1 g by mouth 3 (three) times daily before meals. ) 56 tablet 0  . tamsulosin (FLOMAX) 0.4 MG CAPS capsule Take 0.4 mg by mouth daily.     Marland Kitchen  ipratropium-albuterol (DUONEB) 0.5-2.5 (3) MG/3ML SOLN Take 3 mLs by nebulization every 6 (six) hours as needed. (Patient not taking: Reported on 07/05/2015) 360 mL 1  . metoprolol tartrate (LOPRESSOR) 50 MG tablet Take 1 tablet (50 mg total) by mouth 2 (two) times daily. (Patient not taking: Reported on 07/05/2015) 62 tablet 0     ROS:                                                                                                                                       History obtained from the patient  General ROS: negative for - chills, fatigue, fever, night sweats, weight gain or weight loss Psychological ROS: negative for - behavioral disorder, hallucinations, memory difficulties, mood swings or suicidal ideation Ophthalmic ROS: negative for - blurry vision, double vision, eye pain or loss of vision ENT  ROS: negative for - epistaxis, nasal discharge, oral lesions, sore throat, tinnitus or vertigo Allergy and Immunology ROS: negative for - hives or itchy/watery eyes Hematological and Lymphatic ROS: negative for - bleeding problems, bruising or swollen lymph nodes Endocrine ROS: negative for - galactorrhea, hair pattern changes, polydipsia/polyuria or temperature intolerance Respiratory ROS: negative for - cough, hemoptysis, shortness of breath or wheezing Cardiovascular ROS: negative for - chest pain, dyspnea on exertion, edema or irregular heartbeat Gastrointestinal ROS: negative for - abdominal pain, diarrhea, hematemesis, nausea/vomiting or stool incontinence Genito-Urinary ROS: negative for - dysuria, hematuria, incontinence or urinary frequency/urgency Musculoskeletal ROS: negative for - joint swelling or muscular weakness Neurological ROS: as noted in HPI Dermatological ROS: negative for rash and skin lesion changes  Neurologic Examination:                                                                                                      Blood pressure 149/74, pulse 71, temperature 98.7  F (37.1 C), temperature source Oral, resp. rate 13, SpO2 99 %.  HEENT-  Normocephalic, no lesions, without obvious abnormality.  Normal external eye and conjunctiva.  Normal TM's bilaterally.  Normal auditory canals and external ears. Normal external nose, mucus membranes and septum.  Normal pharynx. Cardiovascular- regularly irregular rhythm, pulses palpable throughout   Lungs- chest clear, no wheezing, rales, normal symmetric air entry Abdomen- normal findings: bowel sounds normal Extremities- no edema Lymph-no adenopathy palpable Musculoskeletal-no joint tenderness, deformity or swelling Skin-warm and dry, no hyperpigmentation, vitiligo, or suspicious lesions  Neurological Examination Mental Status: Alert, oriented,.  Speech dysarthric without evidence of aphasia.  Able to follow 3 step commands without difficulty. Cranial Nerves: II: ; Visual fields grossly normal, pupils equal, round, reactive to light and accommodation III,IV, VI: ptosis not present, extra-ocular motions intact bilaterally but has less movement to the right when crossing midline.  V,VII: smile asymmetric on the right, facial light touch sensation normal bilaterally VIII: hearing normal bilaterally IX,X: uvula rises symmetrically XI: bilateral shoulder shrug XII: midline tongue extension Motor: Right : Upper extremity   5/5    Left:     Upper extremity   5/5  Lower extremity   5/5     Lower extremity   5/5 Tone and bulk:normal tone throughout; no atrophy noted Sensory: Pinprick and light touch intact throughout, bilaterally Deep Tendon Reflexes: 1+ and symmetric throughout Plantars: Right: downgoing   Left: downgoing Cerebellar: normal finger-to-nose, and normal heel-to-shin test Gait: not tested       Lab Results: Basic Metabolic Panel: No results for input(s): NA, K, CL, CO2, GLUCOSE, BUN, CREATININE, CALCIUM, MG, PHOS in the last 168 hours.  Liver Function Tests: No results for input(s): AST, ALT,  ALKPHOS, BILITOT, PROT, ALBUMIN in the last 168 hours. No results for input(s): LIPASE, AMYLASE in the last 168 hours. No results for input(s): AMMONIA in the last 168 hours.  CBC: No results for input(s): WBC, NEUTROABS, HGB, HCT, MCV, PLT in the last 168 hours.  Cardiac Enzymes: No results for input(s): CKTOTAL, CKMB, CKMBINDEX, TROPONINI in the last 168 hours.  Lipid Panel: No results for input(s): CHOL, TRIG, HDL, CHOLHDL, VLDL, LDLCALC in the last 168 hours.  CBG: No results for input(s): GLUCAP in the last 168 hours.  Microbiology: Results for orders placed or performed during the hospital encounter of 06/25/14  MRSA PCR Screening     Status: None   Collection Time: 06/25/14 11:48 PM  Result Value Ref Range Status   MRSA by PCR NEGATIVE NEGATIVE Final    Comment:        The GeneXpert MRSA Assay (FDA approved for NASAL specimens only), is one component of a comprehensive MRSA colonization surveillance program. It is not intended to diagnose MRSA infection nor to guide or monitor treatment for MRSA infections.     Coagulation Studies: No results for input(s): LABPROT, INR in the last 72 hours.  Imaging: No results found.     Assessment and plan discussed with with attending physician and they are in agreement.    Etta Quill PA-C [facility] [phone]  07/05/2015, 2:31 PM   Assessment: 82 y.o. male with likely CVA secondary to HTN while getting angry. No studies have been had at this point. CT pending.   Stroke Risk Factors - hyperlipidemia and hypertension  Recommend: 1. HgbA1c, fasting lipid panel 2. MRI, MRA  of the brain without contrast--after head CT 3. PT consult, OT consult, Speech consult 4. Echocardiogram 5. Carotid dopplers 6. Prophylactic therapy-ASA 325 if CT shows no ICH.  7. Risk factor modification 8. Telemetry monitoring 9. Frequent neuro checks 10 NPO until passes stroke swallow screen 11 please page stroke NP  Or   PA  Or MD from 8am -4 pm  as this patient from this time will be  followed by the stroke.   You can look them up on [email or web address]    Neurology Attending Addendum Patient seen, examined, d/w Mr. Tamala Julian. I have reviewed his note and agree with the following additions.   The patient has had slurred speech for about one day with variable complaints of left sided numbness as well per his power of attorney. He also has a reported history of Alzheimer's dementia per POA.   On my exam, the patient's speech is moderately dysarthric. He does not appear aphasic. The remainder of his neurologic exam is nonfocal. However, he did report that he did not feel me tapping his left knee with the reflex hammer as well as he felt it on the right. Despite this, he had intact and symmetric pinprick sensation.   Recs as per PA note. Will have stroke team follow up in AM.

## 2015-07-05 NOTE — ED Provider Notes (Signed)
CSN: [id number]     Arrival date & time 07/05/15  1340 History   First MD Initiated Contact with Patient 07/05/15 1349     Chief Complaint  Patient presents with  . Aphasia     (Consider location/radiation/quality/duration/timing/severity/associated sxs/prior Treatment) The history is provided by the patient and the EMS personnel.     Patient presents with slurred speech that began yesterday.  States he was arguing with the staff and started crying.  Denies pain anywhere.  Level V caveat for severe dysarthria.   Past Medical History  Diagnosis Date  . CKD (chronic kidney disease)   . CAD (coronary artery disease)   . HTN (hypertension)   . PTSD (post-traumatic stress disorder)   . Tobacco abuse   . Hyperlipidemia   . COPD (chronic obstructive pulmonary disease) ([place])   . Peripheral arterial disease ([place])   . BPH (benign prostatic hyperplasia)    Past Surgical History  Procedure Laterality Date  . Prostate surgery      for BPH, but not know the detail by pt  . Esophagogastroduodenoscopy N/A 06/28/2014    Procedure: ESOPHAGOGASTRODUODENOSCOPY (EGD);  Surgeon: Laurence Spates, MD;  Location: [facility] ENDOSCOPY;  Service: Endoscopy;  Laterality: N/A;   Family History  Problem Relation Age of Onset  . Heart attack Father   . Hyperlipidemia Father   . Hypertension Father   . Stroke Father    Social History  Substance Use Topics  . Smoking status: Former Smoker    Types: Cigarettes    Quit date: 12/01/2012  . Smokeless tobacco: None  . Alcohol Use: No    Review of Systems  Unable to perform ROS: Other  Dysarthria.      Allergies  Review of patient's allergies indicates no known allergies.  Home Medications   Prior to Admission medications   Medication Sig Start Date End Date Taking? Authorizing Provider  albuterol (PROVENTIL) (2.5 MG/3ML) 0.083% nebulizer solution Take 3 mLs (2.5 mg total) by nebulization every 4 (four) hours as needed for wheezing or shortness of  breath. 06/04/14   Robbie Lis, MD  amLODipine (NORVASC) 10 MG tablet Take 10 mg by mouth daily. 05/20/14   Historical Provider, MD  atorvastatin (LIPITOR) 40 MG tablet Take 40 mg by mouth daily. 05/20/14   Historical Provider, MD  budesonide-formoterol (SYMBICORT) 160-4.5 MCG/ACT inhaler Inhale 2 puffs into the lungs 2 (two) times daily.    Historical Provider, MD  clopidogrel (PLAVIX) 75 MG tablet Take 75 mg by mouth daily. 05/20/14   Historical Provider, MD  donepezil (ARICEPT) 5 MG tablet Take 5 mg by mouth at bedtime. 05/20/14   Historical Provider, MD  ferrous sulfate 325 (65 FE) MG tablet Take 1 tablet (325 mg total) by mouth 2 (two) times daily with a meal. 07/02/14   Eugenie Filler, MD  furosemide (LASIX) 20 MG tablet Take 20 mg by mouth See admin instructions. Tues, Thurs, Sat, Sun    Historical Provider, MD  haloperidol (HALDOL) 5 MG tablet Take 5-10 mg by mouth every 6 (six) hours as needed for agitation.  05/27/14   Historical Provider, MD  ipratropium-albuterol (DUONEB) 0.5-2.5 (3) MG/3ML SOLN Take 3 mLs by nebulization every 6 (six) hours as needed. Patient taking differently: Take 3 mLs by nebulization every 6 (six) hours as needed (shortness of breath).  06/13/14   Hosie Poisson, MD  metoprolol tartrate (LOPRESSOR) 50 MG tablet Take 1 tablet (50 mg total) by mouth 2 (two) times daily. 07/03/14   Quillian Quince  Durene Cal, MD  pantoprazole (PROTONIX) 40 MG tablet Take 1 tablet (40 mg total) by mouth daily. 06/30/14   Nita Sells, MD  SEROQUEL XR 200 MG 24 hr tablet Take 200 mg by mouth every evening.  05/26/14   Historical Provider, MD  sorbitol 70 % SOLN Take 30 mLs by mouth daily as needed for moderate constipation. 06/30/14   Nita Sells, MD  sucralfate (CARAFATE) 1 G tablet Take 1 tablet (1 g total) by mouth 4 (four) times daily -  with meals and at bedtime. 07/01/14   Eugenie Filler, MD  tamsulosin (FLOMAX) 0.4 MG CAPS capsule Take 0.4 mg by mouth daily.     Historical Provider,  MD   BP 149/74 mmHg  Pulse 71  Temp(Src) 98.7 F (37.1 C) (Oral)  Resp 13  SpO2 99% Physical Exam  Constitutional: He appears well-developed and well-nourished. No distress.  HENT:  Head: Normocephalic and atraumatic.  Neck: Neck supple.  Cardiovascular: Normal rate and regular rhythm.   Pulmonary/Chest: Effort normal. No respiratory distress. He has decreased breath sounds. He has no wheezes.  Abdominal: Soft. He exhibits no distension. There is no tenderness. There is no rebound and no guarding.  Neurological: He is alert. He exhibits normal muscle tone.  Dysarthria.    CN II-XII intact with exception of severe dysarthria, possible right mouth moving less well and tongue very slight deviation to right, EOMs intact, no pronator drift, grip strengths equal bilaterally; strength 5/5 in all extremities though seems slightly more hesitant on the right sensation intact in all extremities; finger to nose shaky and slow but accurate, heel to shin normal.      Skin: He is not diaphoretic.  Nursing note and vitals reviewed.   ED Course  Procedures (including critical care time) Labs Review Labs Reviewed  CBC - Abnormal; Notable for the following:    RBC 4.01 (*)    Hemoglobin 10.8 (*)    HCT 34.6 (*)    All other components within normal limits  COMPREHENSIVE METABOLIC PANEL - Abnormal; Notable for the following:    Glucose, Bld 100 (*)    BUN 25 (*)    Creatinine, Ser 1.56 (*)    GFR calc non Af Amer 39 (*)    GFR calc Af Amer 46 (*)    All other components within normal limits  CBC - Abnormal; Notable for the following:    RBC 3.87 (*)    Hemoglobin 10.4 (*)    HCT 33.4 (*)    All other components within normal limits  I-STAT CHEM 8, ED - Abnormal; Notable for the following:    BUN 34 (*)    Creatinine, Ser 1.60 (*)    Hemoglobin 11.9 (*)    HCT 35.0 (*)    All other components within normal limits  ETHANOL  PROTIME-INR  APTT  DIFFERENTIAL  URINE RAPID DRUG SCREEN,  HOSP PERFORMED  URINALYSIS, ROUTINE W REFLEX MICROSCOPIC (NOT AT [facility])  CREATININE, SERUM  MAGNESIUM  PHOSPHORUS  BASIC METABOLIC PANEL  CBC  HEMOGLOBIN A1C  LIPID PANEL  I-STAT TROPOININ, ED    Imaging Review Dg Chest 2 View  07/05/2015  CLINICAL DATA:  Dysarthria EXAM: CHEST  2 VIEW COMPARISON:  07/23/2014 FINDINGS: Cardiomediastinal silhouette is stable. Mild hyperinflation. No acute infiltrate or pleural effusion. No pulmonary edema. Mild degenerative changes mid thoracic spine. IMPRESSION: No active disease.  Mild hyperinflation. Electronically Signed   By: Lahoma Crocker M.D.   On: 07/05/2015 14:48  Ct Head Wo Contrast  07/05/2015  CLINICAL DATA:  Dysarthric speech EXAM: CT HEAD WITHOUT CONTRAST TECHNIQUE: Contiguous axial images were obtained from the base of the skull through the vertex without intravenous contrast. COMPARISON:  06/25/2014 FINDINGS: Mild low attenuation within the subcortical and periventricular [place] matter is identified compatible with chronic microvascular disease and brain atrophy. No abnormal extra-axial fluid collection, intracranial hemorrhage or mass identified. There is prominence of the sulci and ventricles compatible with brain atrophy. The patient is status post right median antrectomy and right ethmoidectomy. The sinuses appear clear. The mastoid air cells are clear. The calvarium is intact. IMPRESSION: 1. No acute intracranial abnormalities. 2. Chronic microvascular disease and brain atrophy Electronically Signed   By: Kerby Moors M.D.   On: 07/05/2015 15:19   I have personally reviewed and evaluated these images and lab results as part of my medical decision-making.   EKG Interpretation   Date/Time:  Monday July 05 2015 13:49:18 EDT Ventricular Rate:  70 PR Interval:    QRS Duration: 84 QT Interval:  403 QTC Calculation: 435 R Axis:   81 Text Interpretation:  Sinus rhythm Multiform ventricular premature  complexes Borderline right axis deviation  Minimal ST elevation, anterior  leads Confirmed by Alvino Chapel  MD, Ovid Curd [phone]) on 07/05/2015 3:28:06 PM      MDM   Final diagnoses:  Dysarthria    Pt with multiple medical problems reported by staff and by patient to have become dysarthric yesterday.  Clinical concern for stroke.  Labs significant for anemia, renal insufficiency.  CT head negative for acute stroke.  Pt seen by neurology.  Will need admission and further workup for probable stroke.  Admitted to [facility], Dr Marthenia Rolling accepting.      Clayton Bibles, PA-C 07/05/15 Crocker, MD 07/06/15 [phone]

## 2015-07-05 NOTE — ED Notes (Signed)
Neuro at bedside.

## 2015-07-05 NOTE — ED Notes (Signed)
Pt able to stand and use urinal at this time.

## 2015-07-05 NOTE — ED Notes (Signed)
Chase Johnson, pt POA requested pt be XXX and that password is to be "[place]"

## 2015-07-05 NOTE — H&P (Signed)
History and Physical  Chase Johnson [id number] DOB: Oct 13, 1930 DOA: 07/05/2015  Referring physician: ER Physician PCP: [name], PA-C  Outpatient Specialists:    Patient coming from: SNF  Chief Complaint: Dysarthria since yesterday  HPI: 82 year old male with documented history of CKD, tobacco use, HL, COPD and PAD. Patient is a resident in a SNF. Patient could not or would not give me any history, and was resistant to management. Collateral information reveals that the patient developed dysarthria yesterday that would not resolve. Patient presented to the ER for work up of possible CVA. BP is elevated. CT head is reveals no acute process, but revealed chronic ischemic changes. Neurology has seen patient and advised Aspirin if no ICH is noted.   ED Course: Initial CVA work up  Pertinent labs: CT Head is negative.  Review of Systems: Unobtainable.  Past Medical History  Diagnosis Date  . CKD (chronic kidney disease)   . CAD (coronary artery disease)   . HTN (hypertension)   . PTSD (post-traumatic stress disorder)   . Tobacco abuse   . Hyperlipidemia   . COPD (chronic obstructive pulmonary disease) ([place])   . Peripheral arterial disease ([place])   . BPH (benign prostatic hyperplasia)     Past Surgical History  Procedure Laterality Date  . Prostate surgery      for BPH, but not know the detail by pt  . Esophagogastroduodenoscopy N/A 06/28/2014    Procedure: ESOPHAGOGASTRODUODENOSCOPY (EGD);  Surgeon: Laurence Spates, MD;  Location: [facility] ENDOSCOPY;  Service: Endoscopy;  Laterality: N/A;     reports that he quit smoking about 2 years ago. His smoking use included Cigarettes. He does not have any smokeless tobacco history on file. He reports that he does not drink alcohol. His drug history is not on file.  No Known Allergies  Family History  Problem Relation Age of Onset  . Heart attack Father   . Hyperlipidemia Father   . Hypertension Father   . Stroke Father       Prior to Admission medications   Medication Sig Start Date End Date Taking? Authorizing Provider  albuterol (PROVENTIL) (2.5 MG/3ML) 0.083% nebulizer solution Take 3 mLs (2.5 mg total) by nebulization every 4 (four) hours as needed for wheezing or shortness of breath. 06/04/14  Yes Robbie Lis, MD  amLODipine (NORVASC) 10 MG tablet Take 10 mg by mouth daily. 05/20/14  Yes Historical Provider, MD  atorvastatin (LIPITOR) 40 MG tablet Take 40 mg by mouth daily. 05/20/14  Yes Historical Provider, MD  donepezil (ARICEPT) 5 MG tablet Take 5 mg by mouth at bedtime. 05/20/14  Yes Historical Provider, MD  ferrous sulfate 325 (65 FE) MG tablet Take 1 tablet (325 mg total) by mouth 2 (two) times daily with a meal. 07/02/14  Yes Eugenie Filler, MD  Fluticasone-Salmeterol (ADVAIR) 100-50 MCG/DOSE AEPB Inhale 1 puff into the lungs 2 (two) times daily.   Yes Historical Provider, MD  furosemide (LASIX) 20 MG tablet Take 10 mg by mouth daily.    Yes Historical Provider, MD  ipratropium (ATROVENT) 0.02 % nebulizer solution Take 0.5 mg by nebulization every 4 (four) hours.   Yes Historical Provider, MD  Melatonin 3 MG CAPS Take 3 mg by mouth every evening.   Yes Historical Provider, MD  metoprolol tartrate (LOPRESSOR) 25 MG tablet Take 12.5 mg by mouth 2 (two) times daily.   Yes Historical Provider, MD  pantoprazole (PROTONIX) 40 MG tablet Take 1 tablet (40 mg total) by mouth daily.  06/30/14  Yes Nita Sells, MD  potassium chloride (K-DUR) 10 MEQ tablet Take 10 mEq by mouth daily.   Yes Historical Provider, MD  PRESCRIPTION MEDICATION Take 120 mLs by mouth 2 (two) times daily. Med[employer]   Yes Historical Provider, MD  QUEtiapine (SEROQUEL) 100 MG tablet Take 100 mg by mouth at bedtime.   Yes Historical Provider, MD  sorbitol 70 % SOLN Take 30 mLs by mouth daily as needed for moderate constipation. Patient taking differently: Take 30 mLs by mouth daily.  06/30/14  Yes Nita Sells, MD  sucralfate  (CARAFATE) 1 G tablet Take 1 tablet (1 g total) by mouth 4 (four) times daily -  with meals and at bedtime. Patient taking differently: Take 1 g by mouth 3 (three) times daily before meals.  07/01/14  Yes Eugenie Filler, MD  tamsulosin (FLOMAX) 0.4 MG CAPS capsule Take 0.4 mg by mouth daily.    Yes Historical Provider, MD  metoprolol tartrate (LOPRESSOR) 50 MG tablet Take 1 tablet (50 mg total) by mouth 2 (two) times daily. Patient not taking: Reported on 07/05/2015 07/03/14   Eugenie Filler, MD    Physical Exam: Filed Vitals:   07/05/15 1400 07/05/15 1415 07/05/15 1527 07/05/15 1530  BP: 154/68 159/75 171/73 169/90  Pulse: 64 63 77 71  Temp:      TempSrc:      Resp: 11 15 11 15   SpO2: 100% 99% 100% 100%   Constitutional:  . Resistant to detailed medical examination. Not in distress. Mild facial asymmetry. Eyes:  . No pallor. No jaundice.  ENMT:  . external ears, nose appear normal Neck:  . Neck is supple. No JVD Respiratory:  . CTA bilaterally, no w/r/r.  . Respiratory effort normal. No retractions or accessory muscle use Cardiovascular:  . S1S2 . No LE extremity edema   Abdomen:  . Abdomen is soft and non tender. Organs are difficult to assess. Neurologic:  . Awake and alert. . Moves all limbs, but seems to be mildly weak on the right side compared to the left  Wt Readings from Last 3 Encounters:  07/08/14 62.596 kg (138 lb)  07/03/14 62.596 kg (138 lb)  06/13/14 63 kg (138 lb 14.2 oz)    I have personally reviewed following labs and imaging studies  Labs on Admission:  CBC:  Recent Labs Lab 07/05/15 1435 07/05/15 1448  WBC 7.8  --   NEUTROABS 5.7  --   HGB 10.8* 11.9*  HCT 34.6* 35.0*  MCV 86.3  --   PLT 249  --    Basic Metabolic Panel:  Recent Labs Lab 07/05/15 1435 07/05/15 1448  NA 142 145  K 4.5 4.3  CL 107 103  CO2 27  --   GLUCOSE 100* 98  BUN 25* 34*  CREATININE 1.56* 1.60*  CALCIUM 9.7  --    Liver Function Tests:  Recent  Labs Lab 07/05/15 1435  AST 32  ALT 18  ALKPHOS 60  BILITOT 0.8  PROT 7.5  ALBUMIN 4.4   No results for input(s): LIPASE, AMYLASE in the last 168 hours. No results for input(s): AMMONIA in the last 168 hours. Coagulation Profile:  Recent Labs Lab 07/05/15 1435  INR 1.12   Cardiac Enzymes: No results for input(s): CKTOTAL, CKMB, CKMBINDEX, TROPONINI in the last 168 hours. BNP (last 3 results) No results for input(s): PROBNP in the last 8760 hours. HbA1C: No results for input(s): HGBA1C in the last 72 hours. CBG: No results for input(s):  GLUCAP in the last 168 hours. Lipid Profile: No results for input(s): CHOL, HDL, LDLCALC, TRIG, CHOLHDL, LDLDIRECT in the last 72 hours. Thyroid Function Tests: No results for input(s): TSH, T4TOTAL, FREET4, T3FREE, THYROIDAB in the last 72 hours. Anemia Panel: No results for input(s): VITAMINB12, FOLATE, FERRITIN, TIBC, IRON, RETICCTPCT in the last 72 hours. Urine analysis:    Component Value Date/Time   COLORURINE YELLOW 07/05/2015 1435   APPEARANCEUR CLEAR 07/05/2015 1435   LABSPEC 1.012 07/05/2015 1435   PHURINE 6.0 07/05/2015 1435   GLUCOSEU NEGATIVE 07/05/2015 1435   HGBUR NEGATIVE 07/05/2015 1435   BILIRUBINUR NEGATIVE 07/05/2015 1435   KETONESUR NEGATIVE 07/05/2015 1435   PROTEINUR NEGATIVE 07/05/2015 1435   UROBILINOGEN 0.2 06/25/2014 1720   NITRITE NEGATIVE 07/05/2015 1435   LEUKOCYTESUR NEGATIVE 07/05/2015 1435   Sepsis Labs: @LABRCNTIP (procalcitonin:4,lacticidven:4) )No results found for this or any previous visit (from the past 240 hour(s)).    Radiological Exams on Admission: Dg Chest 2 View  07/05/2015  CLINICAL DATA:  Dysarthria EXAM: CHEST  2 VIEW COMPARISON:  07/23/2014 FINDINGS: Cardiomediastinal silhouette is stable. Mild hyperinflation. No acute infiltrate or pleural effusion. No pulmonary edema. Mild degenerative changes mid thoracic spine. IMPRESSION: No active disease.  Mild hyperinflation. Electronically  Signed   By: Lahoma Crocker M.D.   On: 07/05/2015 14:48   Ct Head Wo Contrast  07/05/2015  CLINICAL DATA:  Dysarthric speech EXAM: CT HEAD WITHOUT CONTRAST TECHNIQUE: Contiguous axial images were obtained from the base of the skull through the vertex without intravenous contrast. COMPARISON:  06/25/2014 FINDINGS: Mild low attenuation within the subcortical and periventricular Metheny matter is identified compatible with chronic microvascular disease and brain atrophy. No abnormal extra-axial fluid collection, intracranial hemorrhage or mass identified. There is prominence of the sulci and ventricles compatible with brain atrophy. The patient is status post right median antrectomy and right ethmoidectomy. The sinuses appear clear. The mastoid air cells are clear. The calvarium is intact. IMPRESSION: 1. No acute intracranial abnormalities. 2. Chronic microvascular disease and brain atrophy Electronically Signed   By: Kerby Moors M.D.   On: 07/05/2015 15:19   Active Problems:   Dysarthria   Acute CVA (cerebrovascular accident) ([place])   Assessment/Plan 1. Likely Acute CVA 2. Accelerated Hypertension 3. History of hyperlipidemia.  Admit patient to Telemetry floor for CVA work up MRI brain MRA brain, Carotid doppler ultrasound, ECHO Fasting lipid profile Permissive hypertension Consult PT/OT EC Aspirin  Further management will depend on hospital course. Neurology has already seen the patient.  DVT prophylaxis: [place] lovenox Code Status: Full Family Communication:  Disposition Plan: Back to SNF eventually   Consults called: Already seen by Neurology   Admission status: Inpatient    Time spent: 10 minutes  Dana Allan, MD  [facility] Pager #: [phone] 7PM-7AM contact night coverage as above   07/05/2015, 4:05 PM

## 2015-07-05 NOTE — Progress Notes (Signed)
Pt has become increasingly agitated since arriving to the unit. Pt using foul language and yelling at staff. Pt noncompliant with calling for assistance before getting out of bed. Becomes frustrated with slurred speech and difficulty communicating. Pt refusing medications at this time. Wendee Copp

## 2015-07-05 NOTE — ED Notes (Signed)
Per EMS - pt from [facility] and [facility]. LSN by family yesterday. Pt family called EMS for slurred speech and "not talking right" today (difficulty getting words out). Facility states pt LSN 1130 but that pt speech and mental status are not changed from speech yesterday. Initially hypertensive; however, pt had taken daily meds around 1130 today. Initial BP 188/80 for facility, 130/60 for EMS.

## 2015-07-06 ENCOUNTER — Inpatient Hospital Stay (HOSPITAL_COMMUNITY): Payer: Medicare Other

## 2015-07-06 ENCOUNTER — Encounter (HOSPITAL_COMMUNITY): Payer: Self-pay | Admitting: General Practice

## 2015-07-06 DIAGNOSIS — I639 Cerebral infarction, unspecified: Principal | ICD-10-CM

## 2015-07-06 DIAGNOSIS — E785 Hyperlipidemia, unspecified: Secondary | ICD-10-CM | POA: Insufficient documentation

## 2015-07-06 DIAGNOSIS — I6789 Other cerebrovascular disease: Secondary | ICD-10-CM

## 2015-07-06 LAB — ECHOCARDIOGRAM COMPLETE
E decel time: 264 msec
E/e' ratio: 8.43
FS: 32 % (ref 28–44)
IVS/LV PW RATIO, ED: 0.99
LA ID, A-P, ES: 30 mm
LA diam end sys: 30 mm
LA diam index: 1.63 cm/m2
LA vol A4C: 37.5 ml
LA vol index: 26.5 mL/m2
LA vol: 48.7 mL
LV E/e' medial: 8.43
LV E/e'average: 8.43
LV PW d: 9.3 mm — AB (ref 0.6–1.1)
LV e' LATERAL: 7.83 cm/s
LVOT SV: 80 mL
LVOT VTI: 25.4 cm
LVOT area: 3.14 cm2
LVOT diameter: 20 mm
LVOT peak vel: 102 cm/s
MV Dec: 264
MV pk A vel: 93.1 m/s
MV pk E vel: 66 m/s
TAPSE: 25.7 mm
TDI e' lateral: 7.83
TDI e' medial: 4.03
Weight: 2208 oz

## 2015-07-06 LAB — CBC
HCT: 33.7 % — ABNORMAL LOW (ref 39.0–52.0)
Hemoglobin: 10.4 g/dL — ABNORMAL LOW (ref 13.0–17.0)
MCH: 26.5 pg (ref 26.0–34.0)
MCHC: 30.9 g/dL (ref 30.0–36.0)
MCV: 85.8 fL (ref 78.0–100.0)
Platelets: 232 10*3/uL (ref 150–400)
RBC: 3.93 MIL/uL — ABNORMAL LOW (ref 4.22–5.81)
RDW: 13.9 % (ref 11.5–15.5)
WBC: 7.5 10*3/uL (ref 4.0–10.5)

## 2015-07-06 LAB — BASIC METABOLIC PANEL
Anion gap: 7 (ref 5–15)
BUN: 22 mg/dL — ABNORMAL HIGH (ref 6–20)
CO2: 31 mmol/L (ref 22–32)
Calcium: 9.8 mg/dL (ref 8.9–10.3)
Chloride: 107 mmol/L (ref 101–111)
Creatinine, Ser: 1.45 mg/dL — ABNORMAL HIGH (ref 0.61–1.24)
GFR calc Af Amer: 50 mL/min — ABNORMAL LOW (ref >60)
GFR calc non Af Amer: 43 mL/min — ABNORMAL LOW (ref >60)
Glucose, Bld: 75 mg/dL (ref 65–99)
Potassium: 3.9 mmol/L (ref 3.5–5.1)
Sodium: 145 mmol/L (ref 135–145)

## 2015-07-06 LAB — LIPID PANEL
Cholesterol: 128 mg/dL (ref 0–200)
HDL: 52 mg/dL (ref >40)
LDL Cholesterol: 68 mg/dL (ref 0–99)
Total CHOL/HDL Ratio: 2.5 RATIO
Triglycerides: 42 mg/dL (ref <150)
VLDL: 8 mg/dL (ref 0–40)

## 2015-07-06 MED ORDER — LORAZEPAM 2 MG/ML IJ SOLN
1.0000 mg | Freq: Once | INTRAMUSCULAR | Status: AC
  Administered 2015-07-06: 1 mg via INTRAVENOUS
  Filled 2015-07-06: qty 1

## 2015-07-06 MED ORDER — LORAZEPAM 2 MG/ML IJ SOLN
0.5000 mg | Freq: Once | INTRAMUSCULAR | Status: AC | PRN
  Administered 2015-07-06: 0.5 mg via INTRAVENOUS
  Filled 2015-07-06: qty 1

## 2015-07-06 MED ORDER — QUETIAPINE FUMARATE 50 MG PO TABS
50.0000 mg | ORAL_TABLET | Freq: Once | ORAL | Status: AC
Start: 2015-07-06 — End: 2015-07-06
  Administered 2015-07-06: 50 mg via ORAL
  Filled 2015-07-06: qty 1

## 2015-07-06 NOTE — Evaluation (Signed)
Physical Therapy Evaluation Patient Details Name: Chase Johnson MRN: [id number] DOB: 03-Mar-1930 Today's Date: 07/06/2015   History of Present Illness  82 yo male admitted from SNF and found to have L interncal capsule infarct. PMH: CKD CAD HTN PTSD COPD PAD, BPH dysarthria history of Alzheimer's dementia.  Clinical Impression  Patient presents with functional limitations due to deficits listed in PT problem list (see below). Pt from SNF and uses w/c for mobility. Pt eager to return to SNF today. Agreeable to SPT bed to/from chair with Min guard for safety. Mobility assessment limited due to agitation. Pt appears to be functioning close to baseline. Will follow to maximize independence and mobility prior to return to SNF.    Follow Up Recommendations SNF    Equipment Recommendations  None recommended by PT    Recommendations for Other Services       Precautions / Restrictions Precautions Precautions: Fall Restrictions Weight Bearing Restrictions: No      Mobility  Bed Mobility Overal bed mobility: Modified Independent             General bed mobility comments: Went from supine to/from sit multiple times during session, no assist needed.  Transfers Overall transfer level: Needs assistance   Transfers: Sit to/from Stand;Stand Pivot Transfers Sit to Stand: Min guard Stand pivot transfers: Min guard       General transfer comment: pt stand pivot to chair with much coaching . Pt appears to be near baseline. could transfer to w/c at current level one person stand by assistance.  Ambulation/Gait Ambulation/Gait assistance:  (Deferred as pt states he uses w/c at baseline.)              Stairs            Wheelchair Mobility    Modified Rankin (Stroke Patients Only) Modified Rankin (Stroke Patients Only) Pre-Morbid Rankin Score: Moderately severe disability Modified Rankin: Moderately severe disability     Balance Overall balance assessment: Needs  assistance Sitting-balance support: Feet supported;No upper extremity supported Sitting balance-Leahy Scale: Good     Standing balance support: During functional activity Standing balance-Leahy Scale: Fair                               Pertinent Vitals/Pain Pain Assessment: No/denies pain    Home Living Family/patient expects to be discharged to:: Skilled nursing facility                      Prior Function Level of Independence: Independent with assistive device(s)         Comments: pt reports he uses a w/c and he does not walk. pt reports he does his own bathing and dressing     Hand Dominance   Dominant Hand: Right    Extremity/Trunk Assessment   Upper Extremity Assessment: Defer to OT evaluation           Lower Extremity Assessment: Overall WFL for tasks assessed (based on limited functional assessment.)      Cervical / Trunk Assessment: Normal  Communication   Communication: Expressive difficulties (dysarthria noted)  Cognition Arousal/Alertness: Awake/alert Behavior During Therapy: Agitated;Restless Overall Cognitive Status: History of cognitive impairments - at baseline                      General Comments      Exercises        Assessment/Plan    PT  Assessment Patient needs continued PT services  PT Diagnosis Difficulty walking   PT Problem List Decreased cognition;Decreased balance;Decreased activity tolerance;Decreased mobility;Decreased safety awareness  PT Treatment Interventions Gait training;Functional mobility training;Therapeutic activities;Therapeutic exercise;Wheelchair mobility training;Patient/family education;Cognitive remediation;Neuromuscular re-education;Balance training   PT Goals (Current goals can be found in the Care Plan section) Acute Rehab PT Goals Patient Stated Goal: to go to skilled nursing today PT Goal Formulation: With patient Time For Goal Achievement: 07/20/15 Potential to Achieve  Goals: Good    Frequency Min 2X/week   Barriers to discharge        Co-evaluation PT/OT/SLP Co-Evaluation/Treatment: Yes Reason for Co-Treatment: For patient/therapist safety PT goals addressed during session: Mobility/safety with mobility;Balance         End of Session   Activity Tolerance: Treatment limited secondary to agitation Patient left: in bed;with call bell/phone within reach;with bed alarm set Nurse Communication: Mobility status         Time: [id number] PT Time Calculation (min) (ACUTE ONLY): 14 min   Charges:   PT Evaluation $PT Eval Moderate Complexity: 1 Procedure     PT G Codes:        Zackry Deines A Koehn Salehi 07/06/2015, 11:40 AM Wray Kearns, PT, DPT [phone]

## 2015-07-06 NOTE — Clinical Social Work Note (Signed)
Clinical Social Work Assessment  Patient Details  Name: Chase Johnson MRN: [id number] Date of Birth: 1930/04/08  Date of referral:  07/06/15               Reason for consult:  Discharge Planning (admitted from [facility] (SNF))                Permission sought to share information with:  Case Manager, Facility Sport and exercise psychologist, Family Supports Permission granted to share information::  Yes, Verbal Permission Granted  Name::        Agency::  [facility]  Relationship::  [name] (POA)    Contact Information:     Housing/Transportation Living arrangements for the past 2 months:  [place] of Information:  Medical Team, Facility, Engineer, materials Patient Interpreter Needed:  None Criminal Activity/Legal Involvement Pertinent to Current Situation/Hospitalization:  No - Comment as needed Significant Relationships:  Friend, Other Family Members Lives with:  Facility Resident Do you feel safe going back to the place where you live?  Yes Need for family participation in patient care:  Yes (Comment)  Care giving concerns:  LCSW unable to speak with patient as he is beligerent and unable to cooperate during assessment.  Call placed to [facility] who completed assessment and gave information necessary for assessment.  [name] reports patient and his wife are residents at [facility] for the last year. Reports suspicious activity at SNF with billing, staff, and people coming to visit patient for money and not paying money back. Reason for XXX is to keep patient safe in hospital and not have certain people expolit patient or know about his current state. [name] reports he is the main Media planner and still wants patient to return to SNF at DC as wife is there and they are private pay due to insurance benefit running out.  [name] reports he will be working with facility to address issues as these have been ongoing.   Social Worker assessment / plan:  LCSW  was consulted as patient is from a facility. Call placed to POA and notified of consult, role and services while in hospital. [name] appreciative of assistance and gave detail information of situation and current deficits in patient.  Reports he will return to facility at DC. LCSW will complete paperwork necessary for DC to SNF.  Will have unit follow up with POA and assist with DC. FL2 updated, MD please sign.  All documents sent to facility for continued care and update.  Employment status:  Retired Health visitor, Managed Care PT Recommendations:  [place] / Referral to community resources:  [place]  Patient/Family's Response to care:  Agreeable to plan  Patient/Family's Understanding of and Emotional Response to Diagnosis, Current Treatment, and Prognosis:  Patient is confused and unable to participate. [name] [facility]) updated and understanding of current treatment and outcomes from patient's current medical condition. [name] expresses concern with current state and possible brain damage. He is acting as a Dentist between patient and facility and his wife with updates.   Emotional Assessment Appearance:  Appears stated age Attitude/Demeanor/Rapport:  Inconsistent, Irrational Affect (typically observed):  Inappropriate, Irritable, Restless Orientation:  Oriented to Self Alcohol / Substance use:  Not Applicable Psych involvement (Current and /or in the community):  No (Comment)  Discharge Needs  Concerns to be addressed:  No discharge needs identified Readmission within the last 30 days:  No Current discharge risk:  None Barriers to Discharge:  Continued Medical  Work up   Lilly [place], LCSW 07/06/2015, 2:14 PM

## 2015-07-06 NOTE — Care Management Note (Signed)
Case Management Note  Patient Details  Name: Chase Johnson MRN: [id number] Date of Birth: 1930-02-03  Subjective/Objective:  Pt admitted with CVA. She is from [facility].                Action/Plan: Awaiting PT/OT recommendations. CM following for discharge disposition.   Expected Discharge Date:                  Expected Discharge Plan:  [place]  In-House Referral:     Discharge planning Services  CM Consult  Post Acute Care Choice:    Choice offered to:     DME Arranged:    DME Agency:     HH Arranged:    [place] Agency:     Status of Service:  In process, will continue to follow  If discussed at Long Length of Stay Meetings, dates discussed:    Additional Comments:  Pollie Friar, RN 07/06/2015, 10:54 AM

## 2015-07-06 NOTE — Progress Notes (Signed)
STROKE TEAM PROGRESS NOTE   HISTORY OF PRESENT ILLNESS (per record) Chase Johnson is an 82 y.o. male who resides in a nursing home and was noted to have dysarthric speech yesterday 07/05/2015 (LKW, time unknown). He was brought to hospital today due to not improving. He states he became very upset yesterday and suddenly noted he had slurred speech. Currently he want to leave but understands he likely had a CVA. Patient was not administered IV t-PA secondary to unknown LKW. He was admitted for further evaluation and treatment.   SUBJECTIVE (INTERVAL HISTORY) No family is at the bedside.  He is sitting up in the bed. Keeps repeating:  "I want to go home!". Eventually, he agrees to stay for CUS and TTE.    OBJECTIVE Temp:  [97.6 F (36.4 C)-98.7 F (37.1 C)] 98 F (36.7 C) (07/04 0605) Pulse Rate:  [63-77] 64 (07/04 0605) Cardiac Rhythm:  [-] Normal sinus rhythm (07/04 0700) Resp:  [11-20] 18 (07/04 0605) BP: (143-171)/(67-90) 143/79 mmHg (07/04 0605) SpO2:  [91 %-100 %] 91 % (07/04 0940) Weight:  [62.596 kg (138 lb)] 62.596 kg (138 lb) (07/03 2233)  CBC:  Recent Labs Lab 07/05/15 1435  07/05/15 1654 07/06/15 0601  WBC 7.8  --  8.3 7.5  NEUTROABS 5.7  --   --   --   HGB 10.8*  < > 10.4* 10.4*  HCT 34.6*  < > 33.4* 33.7*  MCV 86.3  --  86.3 85.8  PLT 249  --  198 232  < > = values in this interval not displayed.  Basic Metabolic Panel:  Recent Labs Lab 07/05/15 1435 07/05/15 1448 07/05/15 1654 07/06/15 0601  NA 142 145  --  145  K 4.5 4.3  --  3.9  CL 107 103  --  107  CO2 27  --   --  31  GLUCOSE 100* 98  --  75  BUN 25* 34*  --  22*  CREATININE 1.56* 1.60* 1.51* 1.45*  CALCIUM 9.7  --   --  9.8  MG  --   --  1.9  --   PHOS  --   --  3.0  --     Lipid Panel:    Component Value Date/Time   CHOL 128 07/06/2015 0601   TRIG 42 07/06/2015 0601   HDL 52 07/06/2015 0601   CHOLHDL 2.5 07/06/2015 0601   VLDL 8 07/06/2015 0601   LDLCALC 68 07/06/2015 0601   HgbA1c: No  results found for: HGBA1C Urine Drug Screen:    Component Value Date/Time   LABOPIA NONE DETECTED 07/05/2015 1435   COCAINSCRNUR NONE DETECTED 07/05/2015 1435   LABBENZ NONE DETECTED 07/05/2015 1435   AMPHETMU NONE DETECTED 07/05/2015 1435   THCU NONE DETECTED 07/05/2015 1435   LABBARB NONE DETECTED 07/05/2015 1435      IMAGING I have personally reviewed the radiological images below and agree with the radiology interpretations.  Dg Chest 2 View 07/05/2015  No active disease.  Mild hyperinflation.   Ct Head Wo Contrast 07/05/2015  1. No acute intracranial abnormalities. 2. Chronic microvascular disease and brain atrophy   MRI HEAD 07/05/2015   Acute 16 x 5 mm infarct LEFT posterior limb of the internal capsule. Involutional changes. Moderate chronic small vessel ischemic disease.   MRA HEAD 07/05/2015    No emergent large vessel occlusion or high-grade stenosis. Moderate luminal irregularity of the basilar artery suggests atherosclerosis and, in part artifact. Complete circle of Willis.   TTE - -  Left ventricle: The cavity size was normal. Systolic function was  normal. Wall motion was normal; there were no regional wall  motion abnormalities. There was an increased relative  contribution of atrial contraction to ventricular filling.  Doppler parameters are consistent with abnormal left ventricular  relaxation (grade 1 diastolic dysfunction). - Pulmonary arteries: Systolic pressure was mildly increased. PA  peak pressure: 37 mm Hg (S).  CUS - pending   PHYSICAL EXAM  Temp:  [97.9 F (36.6 C)-98.2 F (36.8 C)] 98.2 F (36.8 C) (07/04 0940) Pulse Rate:  [64-77] 69 (07/04 0940) Resp:  [18-22] 22 (07/04 0940) BP: (143-166)/(69-88) 145/81 mmHg (07/04 0940) SpO2:  [91 %-100 %] 98 % (07/04 1437) Weight:  [138 lb (62.596 kg)] 138 lb (62.596 kg) (07/03 2233)  General - Well nourished, well developed, in no apparent distress, mild agitation .  Ophthalmologic - Fundi not  visualized due to noncooperation.  Cardiovascular - Regular rate and rhythm.  Mental Status -  Level of arousal and orientation to time, place, and person were intact. Language including expression, naming, repetition, comprehension was assessed and found intact.  Cranial Nerves II - XII - II - Visual field intact OU. III, IV, VI - Extraocular movements intact. V - Facial sensation intact bilaterally. VII - right facial droop VIII - Hearing & vestibular intact bilaterally. X - Palate elevates symmetrically, moderate dysathria. XI - Chin turning & shoulder shrug intact bilaterally. XII - Tongue protrusion intact.  Motor Strength - The patient's strength was normal in all extremities and pronator drift was absent.  Bulk was normal and fasciculations were absent.   Motor Tone - Muscle tone was assessed at the neck and appendages and was normal.  Reflexes - The patient's reflexes were 1+ in all extremities and he had no pathological reflexes.  Sensory - Light touch, temperature/pinprick were assessed and were symmetrical.    Coordination - The patient had normal movements in the hands and feet with no ataxia or dysmetria.  Tremor was absent.  Gait and Station - deferred to PT/OT.   ASSESSMENT/PLAN Chase Johnson is a 82 y.o. male with history of dementia, HTN and HLD presenting with dysarthria. He did not receive IV t-PA due to unclear LKW.   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 small vessel disease. Atophy.   MRA  No significant stenosis   Carotid Doppler  pending   2D Echo  unremarkable   LDL 68  HgbA1c pending  Lovenox 30 mg sq daily for VTE prophylaxis  DIET DYS 3 Room service appropriate?: Yes; Fluid consistency:: Thin  No antithrombotic prior to admission, now on aspirin 325 mg daily.   Patient counseled to be compliant with his antithrombotic  medications  Ongoing aggressive stroke risk factor management  Therapy recommendations:  SNF  Disposition:  Return to SNF (from [facility])  Hypertension  Stable  Permissive hypertension (OK if < 220/120) but gradually normalize in 5-7 days  Long-term BP goal normotensive  Hyperlipidemia  Home meds:  lipitor 40, resumed in hospital  LDL 68, goal < 70  Continue statin at discharge  Other Stroke Risk Factors  Advanced age  Former Cigarette smoker  Family hx stroke (father)  Coronary artery disease  PAD  Other Active Problems  Baseline dementia on [facility] day # 1  Rosalin Hawking, MD PhD Stroke Neurology 07/06/2015 5:54 PM  To contact Stroke Continuity provider, please refer to [email or web address] After hours, contact General Neurology
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
## 2015-07-06 NOTE — NC FL2 (Signed)
[place] LEVEL OF CARE SCREENING TOOL     IDENTIFICATION  Patient Name: Chase Johnson Birthdate: 06-19-1930 Sex: male Admission Date (Current Location): 07/05/2015  [facility] and [place] Number:  Herbalist and Address:  [facility] [place] [address], [place], [place] [address]      Provider Number: [id number]  Attending Physician Name and Address:  Orson Eva, MD  Relative Name and Phone Number:       Current Level of Care: Hospital Recommended Level of Care: [place] Prior Approval Number:    Date Approved/Denied:   PASRR Number:  [id number]   Discharge Plan: SNF    Current Diagnoses: Patient Active Problem List   Diagnosis Date Noted  . Dysarthria 07/05/2015  . Acute CVA (cerebrovascular accident) ([place]) 07/05/2015  . Bleeding gastrointestinal   . Acute on chronic respiratory failure with hypercapnia (HCC)   . Absolute anemia   . Fall 06/26/2014  . Hyperosmolality and hypernatremia 06/26/2014  . Troponin level elevated 06/26/2014  . Acute respiratory failure with hypoxia and hypercarbia ([place]) 06/25/2014  . AKI (acute kidney injury) ([place]) 06/02/2014  . BPH (benign prostatic hyperplasia) 06/02/2014  . Pain in the chest   . Fecal occult blood test positive 06/01/2014  . SOB (shortness of breath) 06/01/2014  . Chest pain 06/01/2014  . Abdominal pain 06/01/2014  . CAD (coronary artery disease)   . HTN (hypertension)   . PTSD (post-traumatic stress disorder)   . COPD exacerbation ([place])   . Peripheral arterial disease ([place]) 12/31/2012  . Hyperlipidemia 12/31/2012    Orientation RESPIRATION BLADDER Height & Weight     Self  O2 (4L (while in hospital, working to wean RA)) Continent Weight:   Height:     BEHAVIORAL SYMPTOMS/MOOD NEUROLOGICAL BOWEL NUTRITION STATUS      Continent Diet (Dysphagia 3 (Mech soft);Thin liquid )  AMBULATORY STATUS COMMUNICATION OF NEEDS Skin   Limited Assist Verbally  Normal                       Personal Care Assistance Level of Assistance  Feeding, Bathing, Dressing Bathing Assistance: Limited assistance Feeding assistance: Limited assistance Dressing Assistance: Limited assistance     Functional Limitations Info  Sight, Hearing, Speech Sight Info: Impaired Hearing Info: Impaired Speech Info: Adequate    SPECIAL CARE FACTORS FREQUENCY  PT (By licensed PT), OT (By licensed OT), Speech therapy     PT Frequency: 5 OT Frequency: 5     Speech Therapy Frequency: 24 hour supervision/assistance;Skilled Nursing facility       Contractures Contractures Info: Not present    Additional Factors Info  Code Status, Allergies, Psychotropic Code Status Info: Full Code Allergies Info: NKA Psychotropic Info: Seroquel         Current Medications (07/06/2015):  This is the current hospital active medication list Current Facility-Administered Medications  Medication Dose Route Frequency Provider Last Rate Last Dose  .  stroke: mapping our early stages of recovery book   Does not apply Once Bonnell Public, MD      . albuterol (PROVENTIL) (2.5 MG/3ML) 0.083% nebulizer solution 2.5 mg  2.5 mg Nebulization Q4H PRN Bonnell Public, MD      . albuterol (PROVENTIL) (2.5 MG/3ML) 0.083% nebulizer solution 2.5 mg  2.5 mg Nebulization TID Bonnell Public, MD   2.5 mg at 07/06/15 0937  . aspirin EC tablet 325 mg  325 mg Oral Daily Bonnell Public,  MD   325 mg at 07/06/15 1203  . atorvastatin (LIPITOR) tablet 40 mg  40 mg Oral Daily Bonnell Public, MD   40 mg at 07/06/15 1205  . donepezil (ARICEPT) tablet 5 mg  5 mg Oral QHS Bonnell Public, MD   5 mg at 07/05/15 2200  . enoxaparin (LOVENOX) injection 30 mg  30 mg Subcutaneous Q24H Bonnell Public, MD   30 mg at 07/05/15 1825  . ferrous sulfate tablet 325 mg  325 mg Oral BID WC Bonnell Public, MD   325 mg at 07/06/15 1210  . hydrALAZINE (APRESOLINE) injection 10 mg  10 mg  Intravenous Q4H PRN Bonnell Public, MD      . ipratropium (ATROVENT) nebulizer solution 0.5 mg  0.5 mg Nebulization Q4H PRN Bonnell Public, MD      . metoprolol tartrate (LOPRESSOR) tablet 12.5 mg  12.5 mg Oral BID Bonnell Public, MD   12.5 mg at 07/06/15 1204  . mometasone-formoterol (DULERA) 100-5 MCG/ACT inhaler 2 puff  2 puff Inhalation BID Bonnell Public, MD   2 puff at 07/06/15 [phone]  . pantoprazole (PROTONIX) EC tablet 40 mg  40 mg Oral Daily Bonnell Public, MD   40 mg at 07/06/15 1204  . QUEtiapine (SEROQUEL) tablet 100 mg  100 mg Oral QHS Bonnell Public, MD   100 mg at 07/05/15 2200  . QUEtiapine (SEROQUEL) tablet 50 mg  50 mg Oral Once Janece Canterbury, MD      . sucralfate (CARAFATE) tablet 1 g  1 g Oral TID AC Bonnell Public, MD   1 g at 07/06/15 1204  . tamsulosin (FLOMAX) capsule 0.4 mg  0.4 mg Oral Daily Bonnell Public, MD   0.4 mg at 07/06/15 1204     Discharge Medications: Please see discharge summary for a list of discharge medications.  Relevant Imaging Results:  Relevant Lab Results:   Additional Information SS #   [id number]  Lilly C[place], LCSW   Jones Broom. [place], MSW, [place] 07/08/2015 10:24 AM

## 2015-07-06 NOTE — Evaluation (Signed)
Clinical/Bedside Swallow Evaluation Patient Details  Name: Chase Johnson MRN: [id number] Date of Birth: [birth year]  Today's Date: 07/06/2015 Time: SLP Start Time (ACUTE ONLY): 0948 SLP Stop Time (ACUTE ONLY): 1000 SLP Time Calculation (min) (ACUTE ONLY): 12 min  Past Medical History:  Past Medical History  Diagnosis Date  . CKD (chronic kidney disease)   . CAD (coronary artery disease)   . HTN (hypertension)   . PTSD (post-traumatic stress disorder)   . Tobacco abuse   . Hyperlipidemia   . COPD (chronic obstructive pulmonary disease) ([place])   . Peripheral arterial disease ([place])   . BPH (benign prostatic hyperplasia)   . Dysarthria 07/05/2015  . Respiratory difficulty 07/05/2015   Past Surgical History:  Past Surgical History  Procedure Laterality Date  . Prostate surgery      for BPH, but not know the detail by pt  . Esophagogastroduodenoscopy N/A 06/28/2014    Procedure: ESOPHAGOGASTRODUODENOSCOPY (EGD);  Surgeon: Laurence Spates, MD;  Location: [facility] END[facility]COPY;  Service: Endoscopy;  Laterality: N/A;   HPI:  82 year old male who residues in SNF with documented history of CKD, tobacco use, HL, COPD and PAD. He presents with slurred speech; MRI shows 16 x 5 mm infarct LEFT posterior limb of the internal capsule.   Assessment / Plan / Recommendation Clinical Impression  Pt consumed regular textures and thin liquids without overt signs of aspiration or difficulty with oral preparation/transit. Given his current mentation, would recommend Dys 3 diet and thin liquids with full supervision. No acute needs identified for swallow, but recommend MD order SLP cognitive-linguistic evaluation.    Aspiration Risk  Mild aspiration risk    Diet Recommendation Dysphagia 3 (Mech soft);Thin liquid   Liquid Administration via: Cup;Straw Medication Administration: Whole meds with puree Supervision: Patient able to self feed;Full supervision/cueing for compensatory strategies Compensations:  Minimize environmental distractions;Slow rate;Small sips/bites Postural Changes: Seated upright at 90 degrees;Remain upright for at least 30 minutes after po intake    Other  Recommendations Recommended Consults: Other (Comment) (SLP cognitive-linguistic evaluation) Oral Care Recommendations: Oral care BID   Follow up Recommendations  24 hour supervision/assistance;Skilled Nursing facility    Frequency and Duration            Prognosis        Swallow Study   General HPI: 82 year old male who residues in SNF with documented history of CKD, tobacco use, HL, COPD and PAD. He presents with slurred speech; MRI shows 16 x 5 mm infarct LEFT posterior limb of the internal capsule. Type of Study: Bedside Swallow Evaluation Previous Swallow Assessment: BSE 06/27/14 recommending thin liquids, soft solids Diet Prior to this Study: NPO Temperature Spikes Noted: No Respiratory Status: Nasal cannula History of Recent Intubation: No Behavior/Cognition: Alert;Cooperative;Requires cueing;Confused Oral Care Completed by SLP: No Vision: Functional for self-feeding Self-Feeding Abilities: Able to feed self Patient Positioning: Upright in bed Baseline Vocal Quality: Normal    Oral/Motor/Sensory Function     Ice Chips Ice chips: Not tested   Thin Liquid Thin Liquid: Within functional limits Presentation: Cup;Self Fed;Straw    Nectar Thick Nectar Thick Liquid: Not tested   Honey Thick Honey Thick Liquid: Not tested   Puree Puree: Not tested   Solid   GO   Solid: Within functional limits Presentation: Self Fed        Germain Osgood, M.A. CCC-SLP [phone]  Germain Osgood 07/06/2015,10:27 AM

## 2015-07-06 NOTE — Progress Notes (Signed)
PROGRESS NOTE  Chase Johnson  [id number] DOB: 11-21-1930 DOA: 07/05/2015 PCP: [name], Hershal Coria  Brief Narrative:   82 year old male with documented history of CKD, tobacco use, HL, COPD and PAD. Patient is a resident in a SNF. Patient could not or would not give any history and was resistant to management. Collateral information revealed that the patient developed dysarthria the day prior to admission.  CT head revealed only chronic ischemic changes, but MRI confirmed an acute 16 x 5 mm infarct of the LEFT posterior limb of the internal capuse.  MRA demonstrated some moderate luminal irregularities but no large vessel occlusions or high grade stenosis.  He is awaiting ECHO and carotid duplex.  Telemetry:  SR.  Once ECHO and carotid duplex are complete, anticipate discharge back to SNF Wednesday to continue PT/OT and speech therapy.    Assessment & Plan:   Active Problems:   Dysarthria   Acute CVA (cerebrovascular accident) ([place])   Dysarthria due to acute 16 x 5 mm infarct of the LEFT posterior limb of the internal capuse.   -  MRA demonstrated some moderate luminal irregularities but no large vessel occlusions or high grade stenosis.     -  Telemetry:  NSR -  Carotid duplex pending -  ECHO pending -  Aspirin 325mg  daily and high dose statin -  Lipid panel:  LDL 68 -  A1c pending -  PT/OT/SLP recommending return to SNF.   -  Neurology assistance appreciated -  UA negative  Essential hypertension, blood pressure mildly elevated -  Continue metoprolol  Suspected dementia, with agitation  -  Continue seroquel  -  Additional dose of seroquel to allow [place] to complete our last tests (ECHO and carotid duplex) -  Continue donepezil  COPD, stable, continue dulera  GERD, stable, continue PPI and carafate  Iron deficiency, stable, continue iron supplementation  CKD stage 3, creatinine stable near 1.4-1.6  BPH, stable, continue flomax  Normocytic anemia, likely of chronic  kidney disease.  Hemoglobin near baseline.      DVT prophylaxis:  Lovenox Code Status:  Full code Family Communication:  Patient alone Disposition Plan:  Likely to skilled nursing facility on 07/07/2015   Consultants:   Neurology, stroke team Dr. Erlinda Hong  Procedures:  none  Antimicrobials:   none    Subjective: Having difficulty speaking.  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  He wants to go back to his SNF where he keeps some of his books.    Objective: Filed Vitals:   07/06/15 0204 07/06/15 0415 07/06/15 0605 07/06/15 0940  BP: 145/76 147/84 143/79 145/81  Pulse: 67 77 64 69  Temp: 97.9 F (36.6 C) 98.2 F (36.8 C) 98 F (36.7 C) 98.2 F (36.8 C)  TempSrc: Oral Oral Oral Oral  Resp: 18 18 18 22   SpO2: 100% 100% 100% 91%    Intake/Output Summary (Last 24 hours) at 07/06/15 1434 Last data filed at 07/06/15 1220  Gross per 24 hour  Intake      0 ml  Output   1350 ml  Net  -1350 ml   There were no vitals filed for this visit.  Examination:  General exam:  Thin adult male.  No acute distress.  HEENT:  NCAT, MMM Respiratory system: Clear to auscultation bilaterally Cardiovascular system: Regular rate and rhythm, normal S1/S2. No murmurs, rubs,  gallops or clicks.  Warm extremities Gastrointestinal system: Normal active bowel sounds, soft, nondistended, nontender. MSK:  Normal tone and bulk, no lower extremity edema Neuro:  CN II-XII grossly intact, strength 5/5 throughout, sensation intact to light touch.  Mixed dysarthria and dysphasia, partially able to understand. Occasional outbursts expletives.  Emotionally labile    Data Reviewed: I have personally reviewed following labs and imaging studies  CBC:  Recent Labs Lab 07/05/15 1435 07/05/15 1448 07/05/15 1654 07/06/15 0601  WBC 7.8  --  8.3 7.5  NEUTROABS 5.7  --   --   --   HGB 10.8* 11.9* 10.4*  10.4*  HCT 34.6* 35.0* 33.4* 33.7*  MCV 86.3  --  86.3 85.8  PLT 249  --  198 [id number]   Basic Metabolic Panel:  Recent Labs Lab 07/05/15 1435 07/05/15 1448 07/05/15 1654 07/06/15 0601  NA 142 145  --  145  K 4.5 4.3  --  3.9  CL 107 103  --  107  CO2 27  --   --  31  GLUCOSE 100* 98  --  75  BUN 25* 34*  --  22*  CREATININE 1.56* 1.60* 1.51* 1.45*  CALCIUM 9.7  --   --  9.8  MG  --   --  1.9  --   PHOS  --   --  3.0  --    GFR: CrCl cannot be calculated (Unknown ideal weight.). Liver Function Tests:  Recent Labs Lab 07/05/15 1435  AST 32  ALT 18  ALKPHOS 60  BILITOT 0.8  PROT 7.5  ALBUMIN 4.4   No results for input(s): LIPASE, AMYLASE in the last 168 hours. No results for input(s): AMMONIA in the last 168 hours. Coagulation Profile:  Recent Labs Lab 07/05/15 1435  INR 1.12   Cardiac Enzymes: No results for input(s): CKTOTAL, CKMB, CKMBINDEX, TROPONINI in the last 168 hours. BNP (last 3 results) No results for input(s): PROBNP in the last 8760 hours. HbA1C: No results for input(s): HGBA1C in the last 72 hours. CBG: No results for input(s): GLUCAP in the last 168 hours. Lipid Profile:  Recent Labs  07/06/15 0601  CHOL 128  HDL 52  LDLCALC 68  TRIG 42  CHOLHDL 2.5   Thyroid Function Tests: No results for input(s): TSH, T4TOTAL, FREET4, T3FREE, THYROIDAB in the last 72 hours. Anemia Panel: No results for input(s): VITAMINB12, FOLATE, FERRITIN, TIBC, IRON, RETICCTPCT in the last 72 hours. Urine analysis:    Component Value Date/Time   COLORURINE YELLOW 07/05/2015 1435   APPEARANCEUR CLEAR 07/05/2015 1435   LABSPEC 1.012 07/05/2015 1435   PHURINE 6.0 07/05/2015 1435   GLUCOSEU NEGATIVE 07/05/2015 1435   HGBUR NEGATIVE 07/05/2015 1435   BILIRUBINUR NEGATIVE 07/05/2015 1435   KETONESUR NEGATIVE 07/05/2015 1435   PROTEINUR NEGATIVE 07/05/2015 1435   UROBILINOGEN 0.2 06/25/2014 1720   NITRITE NEGATIVE 07/05/2015 1435   LEUKOCYTESUR NEGATIVE  07/05/2015 1435   Sepsis Labs: @LABRCNTIP (procalcitonin:4,lacticidven:4)  )No results found for this or any previous visit (from the past 240 hour(s)).    Radiology Studies: Dg Chest 2 View  07/05/2015  CLINICAL DATA:  Dysarthria EXAM: CHEST  2 VIEW COMPARISON:  07/23/2014 FINDINGS: Cardiomediastinal silhouette is stable. Mild hyperinflation. No acute infiltrate or pleural effusion. No pulmonary edema. Mild degenerative changes mid thoracic spine. IMPRESSION: No active disease.  Mild hyperinflation. Electronically Signed   By: Lahoma Crocker M.D.   On: 07/05/2015 14:48   Ct Head Wo Contrast  07/05/2015  CLINICAL DATA:  Dysarthric speech EXAM: CT HEAD WITHOUT CONTRAST TECHNIQUE: Contiguous axial images were obtained from the base of the skull through the vertex without intravenous contrast. COMPARISON:  06/25/2014 FINDINGS: Mild low attenuation within the subcortical and periventricular Barra matter is identified compatible with chronic microvascular disease and brain atrophy. No abnormal extra-axial fluid collection, intracranial hemorrhage or mass identified. There is prominence of the sulci and ventricles compatible with brain atrophy. The patient is status post right median antrectomy and right ethmoidectomy. The sinuses appear clear. The mastoid air cells are clear. The calvarium is intact. IMPRESSION: 1. No acute intracranial abnormalities. 2. Chronic microvascular disease and brain atrophy Electronically Signed   By: Kerby Moors M.D.   On: 07/05/2015 15:19   Mr Brain Wo Contrast  07/05/2015  CLINICAL DATA:  Dysarthria beginning yesterday, not improving. Suspect stroke. History of hypertension, hyperlipidemia and chronic kidney disease. EXAM: MRI HEAD WITHOUT CONTRAST MRA HEAD WITHOUT CONTRAST TECHNIQUE: Multiplanar, multiecho pulse sequences of the brain and surrounding structures were obtained without intravenous contrast. Angiographic images of the head were obtained using MRA technique without  contrast. COMPARISON:  CT HEAD July 05, 2015 at 1503 hours an MRI of the head March 14, 2014 FINDINGS: MRI HEAD FINDINGS INTRACRANIAL CONTENTS: 16 x 5 mm reduced diffusion LEFT posterior limb of the internal capsule with corresponding low ADC values. No susceptibility artifact to suggest hemorrhage. The ventricles and sulci are normal for patient's age. No suspicious parenchymal signal, masses or mass effect. Patchy supratentorial pontine Barbaro matter FLAIR T2 hyperintensities. No abnormal extra-axial fluid collections. No extra-axial masses though, contrast enhanced sequences would be more sensitive. Normal major intracranial vascular flow voids present at skull base. ORBITS: The included ocular globes and orbital contents are non-suspicious. SINUSES: Status post RIGHT maxillary antrectomy, uncinectomy and RIGHT ethmoidectomies. Trace paranasal sinus mucosal thickening. Mastoid air cells are well aerated. SKULL/SOFT TISSUES: No abnormal sellar expansion. No suspicious calvarial bone marrow signal. Craniocervical junction maintained. MRA HEAD FINDINGS ANTERIOR CIRCULATION: Normal flow related enhancement of the included cervical, petrous, cavernous and supraclinoid internal carotid arteries. Mild luminal irregularity of the carotid siphon corresponding to calcific atherosclerosis on today's head CT. Normal flow related enhancement of the anterior and middle cerebral arteries, including distal segments. No large vessel occlusion, high-grade stenosis, abnormal luminal irregularity, aneurysm. POSTERIOR CIRCULATION: RIGHT vertebral artery is dominant. LEFT vertebral artery terminates in the posterior inferior cerebellar artery. Moderate irregularity of the basilar artery in part may be technical as there is decreased flow related enhancement through the pre pontine cistern associated with noise. Normal flow related enhancement of the main branch vessels. Robust bilateral posterior communicating arteries with tiny P1  segments on developmental basis. Normal flow related enhancement of the posterior cerebral arteries. No large vessel occlusion, high-grade stenosis, abnormal luminal irregularity, aneurysm. IMPRESSION: MRI HEAD: Acute 16 x 5 mm infarct LEFT posterior limb of the internal capsule. Involutional changes. Moderate chronic small vessel ischemic disease. MRA HEAD: No emergent large vessel occlusion or high-grade stenosis. Moderate luminal irregularity of the basilar artery suggests atherosclerosis and, in part artifact. Complete circle of Willis. Electronically Signed   By: Elon Alas M.D.   On: 07/05/2015 23:23   Mr Lovenia Kim  07/05/2015  CLINICAL DATA:  Dysarthria beginning yesterday, not improving. Suspect stroke. History of hypertension, hyperlipidemia and chronic kidney disease. EXAM: MRI HEAD WITHOUT CONTRAST MRA HEAD WITHOUT CONTRAST TECHNIQUE: Multiplanar, multiecho pulse sequences of the brain and surrounding structures were obtained without intravenous contrast. Angiographic images of the head were obtained using  MRA technique without contrast. COMPARISON:  CT HEAD July 05, 2015 at 1503 hours an MRI of the head March 14, 2014 FINDINGS: MRI HEAD FINDINGS INTRACRANIAL CONTENTS: 16 x 5 mm reduced diffusion LEFT posterior limb of the internal capsule with corresponding low ADC values. No susceptibility artifact to suggest hemorrhage. The ventricles and sulci are normal for patient's age. No suspicious parenchymal signal, masses or mass effect. Patchy supratentorial pontine Paye matter FLAIR T2 hyperintensities. No abnormal extra-axial fluid collections. No extra-axial masses though, contrast enhanced sequences would be more sensitive. Normal major intracranial vascular flow voids present at skull base. ORBITS: The included ocular globes and orbital contents are non-suspicious. SINUSES: Status post RIGHT maxillary antrectomy, uncinectomy and RIGHT ethmoidectomies. Trace paranasal sinus mucosal thickening.  Mastoid air cells are well aerated. SKULL/SOFT TISSUES: No abnormal sellar expansion. No suspicious calvarial bone marrow signal. Craniocervical junction maintained. MRA HEAD FINDINGS ANTERIOR CIRCULATION: Normal flow related enhancement of the included cervical, petrous, cavernous and supraclinoid internal carotid arteries. Mild luminal irregularity of the carotid siphon corresponding to calcific atherosclerosis on today's head CT. Normal flow related enhancement of the anterior and middle cerebral arteries, including distal segments. No large vessel occlusion, high-grade stenosis, abnormal luminal irregularity, aneurysm. POSTERIOR CIRCULATION: RIGHT vertebral artery is dominant. LEFT vertebral artery terminates in the posterior inferior cerebellar artery. Moderate irregularity of the basilar artery in part may be technical as there is decreased flow related enhancement through the pre pontine cistern associated with noise. Normal flow related enhancement of the main branch vessels. Robust bilateral posterior communicating arteries with tiny P1 segments on developmental basis. Normal flow related enhancement of the posterior cerebral arteries. No large vessel occlusion, high-grade stenosis, abnormal luminal irregularity, aneurysm. IMPRESSION: MRI HEAD: Acute 16 x 5 mm infarct LEFT posterior limb of the internal capsule. Involutional changes. Moderate chronic small vessel ischemic disease. MRA HEAD: No emergent large vessel occlusion or high-grade stenosis. Moderate luminal irregularity of the basilar artery suggests atherosclerosis and, in part artifact. Complete circle of Willis. Electronically Signed   By: Elon Alas M.D.   On: 07/05/2015 23:23     Scheduled Meds: .  stroke: mapping our early stages of recovery book   Does not apply Once  . albuterol  2.5 mg Nebulization TID  . aspirin EC  325 mg Oral Daily  . atorvastatin  40 mg Oral Daily  . donepezil  5 mg Oral QHS  . enoxaparin (LOVENOX)  injection  30 mg Subcutaneous Q24H  . ferrous sulfate  325 mg Oral BID WC  . metoprolol tartrate  12.5 mg Oral BID  . mometasone-formoterol  2 puff Inhalation BID  . pantoprazole  40 mg Oral Daily  . QUEtiapine  100 mg Oral QHS  . QUEtiapine  50 mg Oral Once  . sucralfate  1 g Oral TID AC  . tamsulosin  0.4 mg Oral Daily   Continuous Infusions:    LOS: 1 day    Time spent: 30 min    Janece Canterbury, MD [facility] Pager [phone]  If 7PM-7AM, please contact night-coverage [email or web address] Password [facility] 07/06/2015, 2:34 PM

## 2015-07-06 NOTE — Progress Notes (Signed)
Echocardiogram 2D Echocardiogram has been performed.  Chase Johnson 07/06/2015, 3:52 PM

## 2015-07-07 ENCOUNTER — Inpatient Hospital Stay (HOSPITAL_COMMUNITY): Payer: Medicare Other

## 2015-07-07 DIAGNOSIS — I639 Cerebral infarction, unspecified: Secondary | ICD-10-CM

## 2015-07-07 LAB — HEMOGLOBIN A1C
Hgb A1c MFr Bld: 4.7 % — ABNORMAL LOW (ref 4.8–5.6)
Mean Plasma Glucose: 88 mg/dL

## 2015-07-07 MED ORDER — ALBUTEROL SULFATE (2.5 MG/3ML) 0.083% IN NEBU
2.5000 mg | INHALATION_SOLUTION | Freq: Two times a day (BID) | RESPIRATORY_TRACT | Status: DC
  Administered 2015-07-07 – 2015-07-08 (×2): 2.5 mg via RESPIRATORY_TRACT
  Filled 2015-07-07 (×2): qty 3

## 2015-07-07 MED ORDER — QUETIAPINE FUMARATE 50 MG PO TABS
50.0000 mg | ORAL_TABLET | Freq: Once | ORAL | Status: AC
  Administered 2015-07-07: 50 mg via ORAL
  Filled 2015-07-07: qty 1

## 2015-07-07 MED ORDER — LORAZEPAM 0.5 MG PO TABS
0.5000 mg | ORAL_TABLET | Freq: Four times a day (QID) | ORAL | Status: DC | PRN
  Administered 2015-07-08: 0.5 mg via ORAL
  Filled 2015-07-07: qty 1

## 2015-07-07 NOTE — Care Management Important Message (Signed)
Important Message  Patient Details  Name: Chase Johnson MRN: [id number] Date of Birth: 1930/01/22   Medicare Important Message Given:  Yes    Loann Quill 07/07/2015, 11:33 AM

## 2015-07-07 NOTE — Progress Notes (Signed)
STROKE TEAM PROGRESS NOTE   SUBJECTIVE (INTERVAL HISTORY) No family is at the bedside.  He is lying in the bed. Had CUS and TTE all negative. He is happy to be discharged.   OBJECTIVE Temp:  [97.6 F (36.4 C)-98.2 F (36.8 C)] 97.6 F (36.4 C) (07/05 1442) Pulse Rate:  [55-88] 60 (07/05 1442) Cardiac Rhythm:  [-] Normal sinus rhythm (07/05 0704) Resp:  [16-20] 16 (07/05 1442) BP: (139-167)/(58-90) 139/58 mmHg (07/05 1442) SpO2:  [97 %-100 %] 97 % (07/05 1442)  CBC:  Recent Labs Lab 07/05/15 1435  07/05/15 1654 07/06/15 0601  WBC 7.8  --  8.3 7.5  NEUTROABS 5.7  --   --   --   HGB 10.8*  < > 10.4* 10.4*  HCT 34.6*  < > 33.4* 33.7*  MCV 86.3  --  86.3 85.8  PLT 249  --  198 232  < > = values in this interval not displayed.  Basic Metabolic Panel:   Recent Labs Lab 07/05/15 1435 07/05/15 1448 07/05/15 1654 07/06/15 0601  NA 142 145  --  145  K 4.5 4.3  --  3.9  CL 107 103  --  107  CO2 27  --   --  31  GLUCOSE 100* 98  --  75  BUN 25* 34*  --  22*  CREATININE 1.56* 1.60* 1.51* 1.45*  CALCIUM 9.7  --   --  9.8  MG  --   --  1.9  --   PHOS  --   --  3.0  --     Lipid Panel:     Component Value Date/Time   CHOL 128 07/06/2015 0601   TRIG 42 07/06/2015 0601   HDL 52 07/06/2015 0601   CHOLHDL 2.5 07/06/2015 0601   VLDL 8 07/06/2015 0601   LDLCALC 68 07/06/2015 0601   HgbA1c:  Lab Results  Component Value Date   HGBA1C 4.7* 07/06/2015   Urine Drug Screen:     Component Value Date/Time   LABOPIA NONE DETECTED 07/05/2015 1435   COCAINSCRNUR NONE DETECTED 07/05/2015 1435   LABBENZ NONE DETECTED 07/05/2015 1435   AMPHETMU NONE DETECTED 07/05/2015 1435   THCU NONE DETECTED 07/05/2015 1435   LABBARB NONE DETECTED 07/05/2015 1435      IMAGING I have personally reviewed the radiological images below and agree with the radiology interpretations.  Dg Chest 2 View 07/05/2015  No active disease.  Mild hyperinflation.   Ct Head Wo Contrast 07/05/2015  1. No  acute intracranial abnormalities. 2. Chronic microvascular disease and brain atrophy   MRI HEAD 07/05/2015   Acute 16 x 5 mm infarct LEFT posterior limb of the internal capsule. Involutional changes. Moderate chronic small vessel ischemic disease.   MRA HEAD 07/05/2015    No emergent large vessel occlusion or high-grade stenosis. Moderate luminal irregularity of the basilar artery suggests atherosclerosis and, in part artifact. Complete circle of Willis.   TTE - - Left ventricle: The cavity size was normal. Systolic function was  normal. Wall motion was normal; there were no regional wall  motion abnormalities. There was an increased relative  contribution of atrial contraction to ventricular filling.  Doppler parameters are consistent with abnormal left ventricular  relaxation (grade 1 diastolic dysfunction). - Pulmonary arteries: Systolic pressure was mildly increased. PA  peak pressure: 37 mm Hg (S).  CUS - Bilateral: 1-39% ICA stenosis. Vertebral artery flow is antegrade.    PHYSICAL EXAM  Temp:  [97.6 F (36.4 C)-98.2  F (36.8 C)] 97.6 F (36.4 C) (07/05 1442) Pulse Rate:  [55-88] 60 (07/05 1442) Resp:  [16-20] 16 (07/05 1442) BP: (139-167)/(58-90) 139/58 mmHg (07/05 1442) SpO2:  [97 %-100 %] 97 % (07/05 1442)  General - Well nourished, well developed, in no apparent distress.  Ophthalmologic - Fundi not visualized due to noncooperation.  Cardiovascular - Regular rate and rhythm.  Mental Status -  Level of arousal and orientation to time, place, and person were intact. Language including expression, naming, repetition, comprehension was assessed and found intact.  Cranial Nerves II - XII - II - Visual field intact OU. III, IV, VI - Extraocular movements intact. V - Facial sensation intact bilaterally. VII - right facial droop VIII - Hearing & vestibular intact bilaterally. X - Palate elevates symmetrically, moderate dysathria. XI - Chin turning & shoulder shrug  intact bilaterally. XII - Tongue protrusion intact.  Motor Strength - The patient's strength was normal in all extremities and pronator drift was absent.  Bulk was normal and fasciculations were absent.   Motor Tone - Muscle tone was assessed at the neck and appendages and was normal.  Reflexes - The patient's reflexes were 1+ in all extremities and he had no pathological reflexes.  Sensory - Light touch, temperature/pinprick were assessed and were symmetrical.    Coordination - The patient had normal movements in the hands and feet with no ataxia or dysmetria.  Tremor was absent.  Gait and Station - deferred to PT/OT.   ASSESSMENT/PLAN Mr. Chase Johnson is a 82 y.o. male with history of dementia, HTN and HLD presenting with dysarthria. He did not receive IV t-PA due to unclear LKW.   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 small vessel disease. Atophy.   MRA  No significant stenosis   Carotid Doppler  unremarkable   2D Echo  unremarkable   LDL 68  HgbA1c 4.7  Lovenox 30 mg sq daily for VTE prophylaxis DIET DYS 3 Room service appropriate?: Yes; Fluid consistency:: Thin  No antithrombotic prior to admission, now on aspirin 325 mg daily.   Patient counseled to be compliant with his antithrombotic medications  Ongoing aggressive stroke risk factor management  Therapy recommendations:  SNF  Disposition:  Return to SNF (from [facility])  Hypertension  Stable Permissive hypertension (OK if < 220/120) but gradually normalize in 5-7 days Long-term BP goal normotensive  Hyperlipidemia  Home meds:  lipitor 40, resumed in hospital  LDL 68, goal < 70  Continue statin at discharge  Other Stroke Risk Factors  Advanced age  Former Cigarette smoker  Family hx stroke (father)  Coronary artery disease  PAD  Other Active Problems  Baseline dementia on  [facility] day # 2  Neurology will sign off. Please call with questions. Pt will follow up with Dr. Cecille Rubin NP at [facility] in about 2 months. Thanks for the consult.   Rosalin Hawking, MD PhD Stroke Neurology 07/07/2015 4:36 PM      To contact Stroke Continuity provider, please refer to [email or web address] After hours, contact General Neurology

## 2015-07-07 NOTE — Progress Notes (Signed)
Central telemetry notified RN of 6 beat of V-tach. Notified MD via [place]. Wendee Copp

## 2015-07-07 NOTE — Clinical Social Work Note (Signed)
CSW spoke to [facility] who can accept patient once he has been without a sitter for 24 hours.  CSW informed MD and bedside nurse, CSW to continue to follow patient's progress throughout discharge planning.  Jones Broom. [place], MSW, [place] 07/07/2015 5:14 PM

## 2015-07-07 NOTE — Progress Notes (Signed)
VASCULAR LAB PRELIMINARY  PRELIMINARY  PRELIMINARY  PRELIMINARY  Carotid duplex has been completed.     Bilateral:  1-39% ICA stenosis.  Vertebral artery flow is antegrade.     Taryn Shellhammer, RVT, RDMS 07/07/2015, 12:09 PM

## 2015-07-07 NOTE — Progress Notes (Signed)
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  - MRA demonstrated some moderate luminal irregularities but no large vessel occlusions or high grade stenosis.  - MR Brain--L PLIC infarct. small vessel disease - Telemetry: NSR - Carotid duplex--no hemodynamically significant stenosis - ECHO--EF normal, grade 1 twice a day, no WMA - Aspirin 325mg  daily and high dose statin - Lipid panel: LDL 68--continue lipitor 40 mg daily - A1c--4.7 - PT/OT/SLP recommending return to SNF.  - Neurology assistance appreciated - UA negative -Dys 3 diet  Essential hypertension, blood pressure mildly elevated - Continue metoprolol  Suspected dementia, with agitation  - Continue seroquel  - Additional dose of seroquel to allow [place] to complete our last tests (ECHO and carotid duplex) - Continue donepezil -Ativan 0.5 mg every 6 hours when necessary agitation  COPD, stable, continue dulera  GERD, stable, continue PPI and carafate  Iron deficiency, stable, continue iron supplementation  CKD stage 3, creatinine stable near 1.4-1.6  BPH, stable, continue flomax  Normocytic anemia, likely of chronic kidney disease. Hemoglobin near baseline.    Disposition Plan:   SNF 07/08/15 if stable Family Communication:  No  Family at bedside   Consultants:  Neurology  Code Status:  FULL / DNR  DVT Prophylaxis:  [place] Lovenox   Procedures: As Listed in Progress Note Above  Antibiotics: None    Subjective: Patient denies fevers, chills, headache, chest pain, dyspnea, nausea, vomiting, diarrhea, abdominal pain, dysuria, hematuria   Objective: Filed Vitals:   07/07/15 0856 07/07/15 0857 07/07/15 1442 07/07/15 1733  BP: 165/80  139/58 138/71  Pulse: 88  60 88  Temp: 97.8 F  (36.6 C)  97.6 F (36.4 C) 98.1 F (36.7 C)  TempSrc: Axillary  Axillary Oral  Resp: 18  16 18   SpO2: 99% 98% 97% 100%    Intake/Output Summary (Last 24 hours) at 07/07/15 1838 Last data filed at 07/06/15 2200  Gross per 24 hour  Intake      0 ml  Output    100 ml  Net   -100 ml   Weight change:  Exam:   General:  Pt is alert, follows commands appropriately, not in acute distress  HEENT: No icterus, No thrush, No neck mass, [place]/AT  Cardiovascular: RRR, S1/S2, no rubs, no gallops  Respiratory: Diminished breath sounds at the bases but clear to auscultation. No wheezing  Abdomen: Soft/+BS, non tender, non distended, no guarding  Extremities: No edema, No lymphangitis, No petechiae, No rashes, no synovitis   Data Reviewed: I have personally reviewed following labs and imaging studies Basic Metabolic Panel:  Recent Labs Lab 07/05/15 1435 07/05/15 1448 07/05/15 1654 07/06/15 0601  NA 142 145  --  145  K 4.5 4.3  --  3.9  CL 107 103  --  107  CO2 27  --   --  31  GLUCOSE 100* 98  --  75  BUN 25* 34*  --  22*  CREATININE 1.56* 1.60* 1.51* 1.45*  CALCIUM 9.7  --   --  9.8  MG  --   --  1.9  --   PHOS  --   --  3.0  --    Liver Function Tests:  Recent Labs Lab 07/05/15 1435  AST 32  ALT 18  ALKPHOS 60  BILITOT 0.8  PROT 7.5  ALBUMIN 4.4   No results for input(s): LIPASE, AMYLASE in the last 168 hours. No results for input(s): AMMONIA in the last 168 hours. Coagulation Profile:  Recent Labs Lab 07/05/15 1435  INR 1.12   CBC:  Recent Labs Lab 07/05/15 1435 07/05/15 1448 07/05/15 1654 07/06/15 0601  WBC 7.8  --  8.3 7.5  NEUTROABS 5.7  --   --   --   HGB 10.8* 11.9* 10.4* 10.4*  HCT 34.6* 35.0* 33.4* 33.7*  MCV 86.3  --  86.3 85.8  PLT 249  --  198 232   Cardiac Enzymes: No results for input(s): CKTOTAL, CKMB, CKMBINDEX, TROPONINI in the last 168 hours. BNP: Invalid input(s): POCBNP CBG: No results for input(s): GLUCAP in the last 168  hours. HbA1C:  Recent Labs  07/06/15 0601  HGBA1C 4.7*   Urine analysis:    Component Value Date/Time   COLORURINE YELLOW 07/05/2015 1435   APPEARANCEUR CLEAR 07/05/2015 1435   LABSPEC 1.012 07/05/2015 1435   PHURINE 6.0 07/05/2015 1435   GLUCOSEU NEGATIVE 07/05/2015 1435   HGBUR NEGATIVE 07/05/2015 1435   BILIRUBINUR NEGATIVE 07/05/2015 1435   KETONESUR NEGATIVE 07/05/2015 1435   PROTEINUR NEGATIVE 07/05/2015 1435   UROBILINOGEN 0.2 06/25/2014 1720   NITRITE NEGATIVE 07/05/2015 1435   LEUKOCYTESUR NEGATIVE 07/05/2015 1435   Sepsis Labs: @LABRCNTIP (procalcitonin:4,lacticidven:4) )No results found for this or any previous visit (from the past 240 hour(s)).   Scheduled Meds: .  stroke: mapping our early stages of recovery book   Does not apply Once  . albuterol  2.5 mg Nebulization BID  . aspirin EC  325 mg Oral Daily  . atorvastatin  40 mg Oral Daily  . donepezil  5 mg Oral QHS  . enoxaparin (LOVENOX) injection  30 mg Subcutaneous Q24H  . ferrous sulfate  325 mg Oral BID WC  . metoprolol tartrate  12.5 mg Oral BID  . mometasone-formoterol  2 puff Inhalation BID  . pantoprazole  40 mg Oral Daily  . QUEtiapine  100 mg Oral QHS  . sucralfate  1 g Oral TID AC  . tamsulosin  0.4 mg Oral Daily   Continuous Infusions:   Procedures/Studies: Dg Chest 2 View  07/05/2015  CLINICAL DATA:  Dysarthria EXAM: CHEST  2 VIEW COMPARISON:  07/23/2014 FINDINGS: Cardiomediastinal silhouette is stable. Mild hyperinflation. No acute infiltrate or pleural effusion. No pulmonary edema. Mild degenerative changes mid thoracic spine. IMPRESSION: No active disease.  Mild hyperinflation. Electronically Signed   By: Lahoma Crocker M.D.   On: 07/05/2015 14:48   Ct Head Wo Contrast  07/05/2015  CLINICAL DATA:  Dysarthric speech EXAM: CT HEAD WITHOUT CONTRAST TECHNIQUE: Contiguous axial images were obtained from the base of the skull through the vertex without intravenous contrast. COMPARISON:  06/25/2014  FINDINGS: Mild low attenuation within the subcortical and periventricular Mcenery matter is identified compatible with chronic microvascular disease and brain atrophy. No abnormal extra-axial fluid collection, intracranial hemorrhage or mass identified. There is prominence of the sulci and ventricles compatible with brain atrophy. The patient is status post right median antrectomy and right ethmoidectomy. The sinuses appear clear. The mastoid air cells are clear. The calvarium is intact. IMPRESSION: 1. No acute intracranial abnormalities. 2. Chronic microvascular disease and brain atrophy Electronically Signed   By: Kerby Moors M.D.   On: 07/05/2015 15:19   Mr Brain Wo Contrast  07/05/2015  CLINICAL DATA:  Dysarthria beginning yesterday, not improving.  Suspect stroke. History of hypertension, hyperlipidemia and chronic kidney disease. EXAM: MRI HEAD WITHOUT CONTRAST MRA HEAD WITHOUT CONTRAST TECHNIQUE: Multiplanar, multiecho pulse sequences of the brain and surrounding structures were obtained without intravenous contrast. Angiographic images of the head were obtained using MRA technique without contrast. COMPARISON:  CT HEAD July 05, 2015 at 1503 hours an MRI of the head March 14, 2014 FINDINGS: MRI HEAD FINDINGS INTRACRANIAL CONTENTS: 16 x 5 mm reduced diffusion LEFT posterior limb of the internal capsule with corresponding low ADC values. No susceptibility artifact to suggest hemorrhage. The ventricles and sulci are normal for patient's age. No suspicious parenchymal signal, masses or mass effect. Patchy supratentorial pontine Mogan matter FLAIR T2 hyperintensities. No abnormal extra-axial fluid collections. No extra-axial masses though, contrast enhanced sequences would be more sensitive. Normal major intracranial vascular flow voids present at skull base. ORBITS: The included ocular globes and orbital contents are non-suspicious. SINUSES: Status post RIGHT maxillary antrectomy, uncinectomy and RIGHT  ethmoidectomies. Trace paranasal sinus mucosal thickening. Mastoid air cells are well aerated. SKULL/SOFT TISSUES: No abnormal sellar expansion. No suspicious calvarial bone marrow signal. Craniocervical junction maintained. MRA HEAD FINDINGS ANTERIOR CIRCULATION: Normal flow related enhancement of the included cervical, petrous, cavernous and supraclinoid internal carotid arteries. Mild luminal irregularity of the carotid siphon corresponding to calcific atherosclerosis on today's head CT. Normal flow related enhancement of the anterior and middle cerebral arteries, including distal segments. No large vessel occlusion, high-grade stenosis, abnormal luminal irregularity, aneurysm. POSTERIOR CIRCULATION: RIGHT vertebral artery is dominant. LEFT vertebral artery terminates in the posterior inferior cerebellar artery. Moderate irregularity of the basilar artery in part may be technical as there is decreased flow related enhancement through the pre pontine cistern associated with noise. Normal flow related enhancement of the main branch vessels. Robust bilateral posterior communicating arteries with tiny P1 segments on developmental basis. Normal flow related enhancement of the posterior cerebral arteries. No large vessel occlusion, high-grade stenosis, abnormal luminal irregularity, aneurysm. IMPRESSION: MRI HEAD: Acute 16 x 5 mm infarct LEFT posterior limb of the internal capsule. Involutional changes. Moderate chronic small vessel ischemic disease. MRA HEAD: No emergent large vessel occlusion or high-grade stenosis. Moderate luminal irregularity of the basilar artery suggests atherosclerosis and, in part artifact. Complete circle of Willis. Electronically Signed   By: Elon Alas M.D.   On: 07/05/2015 23:23   Mr Lovenia Kim  07/05/2015  CLINICAL DATA:  Dysarthria beginning yesterday, not improving. Suspect stroke. History of hypertension, hyperlipidemia and chronic kidney disease. EXAM: MRI HEAD WITHOUT  CONTRAST MRA HEAD WITHOUT CONTRAST TECHNIQUE: Multiplanar, multiecho pulse sequences of the brain and surrounding structures were obtained without intravenous contrast. Angiographic images of the head were obtained using MRA technique without contrast. COMPARISON:  CT HEAD July 05, 2015 at 1503 hours an MRI of the head March 14, 2014 FINDINGS: MRI HEAD FINDINGS INTRACRANIAL CONTENTS: 16 x 5 mm reduced diffusion LEFT posterior limb of the internal capsule with corresponding low ADC values. No susceptibility artifact to suggest hemorrhage. The ventricles and sulci are normal for patient's age. No suspicious parenchymal signal, masses or mass effect. Patchy supratentorial pontine Rosensteel matter FLAIR T2 hyperintensities. No abnormal extra-axial fluid collections. No extra-axial masses though, contrast enhanced sequences would be more sensitive. Normal major intracranial vascular flow voids present at skull base. ORBITS: The included ocular globes and orbital contents are non-suspicious. SINUSES: Status post RIGHT maxillary antrectomy, uncinectomy and RIGHT ethmoidectomies. Trace paranasal sinus mucosal thickening. Mastoid air cells are well aerated. SKULL/SOFT TISSUES: No abnormal sellar expansion.  No suspicious calvarial bone marrow signal. Craniocervical junction maintained. MRA HEAD FINDINGS ANTERIOR CIRCULATION: Normal flow related enhancement of the included cervical, petrous, cavernous and supraclinoid internal carotid arteries. Mild luminal irregularity of the carotid siphon corresponding to calcific atherosclerosis on today's head CT. Normal flow related enhancement of the anterior and middle cerebral arteries, including distal segments. No large vessel occlusion, high-grade stenosis, abnormal luminal irregularity, aneurysm. POSTERIOR CIRCULATION: RIGHT vertebral artery is dominant. LEFT vertebral artery terminates in the posterior inferior cerebellar artery. Moderate irregularity of the basilar artery in part may  be technical as there is decreased flow related enhancement through the pre pontine cistern associated with noise. Normal flow related enhancement of the main branch vessels. Robust bilateral posterior communicating arteries with tiny P1 segments on developmental basis. Normal flow related enhancement of the posterior cerebral arteries. No large vessel occlusion, high-grade stenosis, abnormal luminal irregularity, aneurysm. IMPRESSION: MRI HEAD: Acute 16 x 5 mm infarct LEFT posterior limb of the internal capsule. Involutional changes. Moderate chronic small vessel ischemic disease. MRA HEAD: No emergent large vessel occlusion or high-grade stenosis. Moderate luminal irregularity of the basilar artery suggests atherosclerosis and, in part artifact. Complete circle of Willis. Electronically Signed   By: Elon Alas M.D.   On: 07/05/2015 23:23    Charelle Petrakis, DO  [facility] Pager [phone]  If 7PM-7AM, please contact night-coverage [email or web address] Password [email or web address] 07/07/2015, 6:38 PM   LOS: 2 days

## 2015-07-07 NOTE — Progress Notes (Signed)
Pt becoming physically aggressive to staff. Not following safety directions. Pt very upset about gown material and using foul language with staff. Notified MD of patient's desire to discharge back to skilled nursing facility. Pt consented to complete doppler study. MD ordered po seroquel.  Pt remains belligerent and abusive. Will continue to monitor. Wendee Copp

## 2015-07-08 DIAGNOSIS — J449 Chronic obstructive pulmonary disease, unspecified: Secondary | ICD-10-CM | POA: Diagnosis not present

## 2015-07-08 DIAGNOSIS — I639 Cerebral infarction, unspecified: Secondary | ICD-10-CM | POA: Diagnosis not present

## 2015-07-08 DIAGNOSIS — Z7901 Long term (current) use of anticoagulants: Secondary | ICD-10-CM | POA: Diagnosis not present

## 2015-07-08 DIAGNOSIS — J440 Chronic obstructive pulmonary disease with acute lower respiratory infection: Secondary | ICD-10-CM | POA: Diagnosis not present

## 2015-07-08 DIAGNOSIS — K219 Gastro-esophageal reflux disease without esophagitis: Secondary | ICD-10-CM | POA: Diagnosis not present

## 2015-07-08 DIAGNOSIS — I10 Essential (primary) hypertension: Secondary | ICD-10-CM | POA: Diagnosis not present

## 2015-07-08 DIAGNOSIS — M6281 Muscle weakness (generalized): Secondary | ICD-10-CM | POA: Diagnosis not present

## 2015-07-08 DIAGNOSIS — D649 Anemia, unspecified: Secondary | ICD-10-CM | POA: Diagnosis not present

## 2015-07-08 DIAGNOSIS — R195 Other fecal abnormalities: Secondary | ICD-10-CM | POA: Diagnosis not present

## 2015-07-08 DIAGNOSIS — E785 Hyperlipidemia, unspecified: Secondary | ICD-10-CM | POA: Diagnosis not present

## 2015-07-08 DIAGNOSIS — R05 Cough: Secondary | ICD-10-CM | POA: Diagnosis not present

## 2015-07-08 DIAGNOSIS — J189 Pneumonia, unspecified organism: Secondary | ICD-10-CM | POA: Diagnosis not present

## 2015-07-08 DIAGNOSIS — I638 Other cerebral infarction: Secondary | ICD-10-CM | POA: Diagnosis not present

## 2015-07-08 DIAGNOSIS — I739 Peripheral vascular disease, unspecified: Secondary | ICD-10-CM | POA: Diagnosis not present

## 2015-07-08 DIAGNOSIS — F0390 Unspecified dementia without behavioral disturbance: Secondary | ICD-10-CM | POA: Diagnosis not present

## 2015-07-08 DIAGNOSIS — I6789 Other cerebrovascular disease: Secondary | ICD-10-CM | POA: Diagnosis not present

## 2015-07-08 DIAGNOSIS — R471 Dysarthria and anarthria: Secondary | ICD-10-CM | POA: Diagnosis not present

## 2015-07-08 DIAGNOSIS — R2681 Unsteadiness on feet: Secondary | ICD-10-CM | POA: Diagnosis not present

## 2015-07-08 DIAGNOSIS — I129 Hypertensive chronic kidney disease with stage 1 through stage 4 chronic kidney disease, or unspecified chronic kidney disease: Secondary | ICD-10-CM | POA: Diagnosis not present

## 2015-07-08 DIAGNOSIS — G8929 Other chronic pain: Secondary | ICD-10-CM | POA: Diagnosis not present

## 2015-07-08 LAB — BASIC METABOLIC PANEL
Anion gap: 10 (ref 5–15)
BUN: 26 mg/dL — AB (ref 6–20)
CHLORIDE: 106 mmol/L (ref 101–111)
CO2: 29 mmol/L (ref 22–32)
Calcium: 9.5 mg/dL (ref 8.9–10.3)
Creatinine, Ser: 1.62 mg/dL — ABNORMAL HIGH (ref 0.61–1.24)
GFR calc Af Amer: 44 mL/min — ABNORMAL LOW (ref >60)
GFR calc non Af Amer: 38 mL/min — ABNORMAL LOW (ref >60)
GLUCOSE: 82 mg/dL (ref 65–99)
POTASSIUM: 3.7 mmol/L (ref 3.5–5.1)
Sodium: 145 mmol/L (ref 135–145)

## 2015-07-08 LAB — MAGNESIUM: Magnesium: 1.9 mg/dL (ref 1.7–2.4)

## 2015-07-08 MED ORDER — ASPIRIN 325 MG PO TBEC: 325.0000 mg | DELAYED_RELEASE_TABLET | Freq: Every day | ORAL | Status: AC

## 2015-07-08 NOTE — Progress Notes (Signed)
Pt to snf, report given to nurse accepting pt. Pt understands all of discharge instructions. POA at bedside and understood as well.

## 2015-07-08 NOTE — Discharge Summary (Signed)
Physician Discharge Summary  Chase Johnson [id number] DOB: 17-Feb-1930 DOA: 07/05/2015  PCP: [name], PA-C  Admit date: 07/05/2015 Discharge date: 07/08/2015  Admitted From: SNF Disposition: SNF Recommendations for Outpatient Follow-up:  1. Follow up with PCP in 1-2 weeks 2. Please obtain BMP   Home Health:none Equipment/Devices:none  Discharge Condition:stable CODE STATUS:FULL Diet recommendation: Heart Healthy   Brief/Interim Summary: 82 year old male with documented history of CKD, tobacco use, HL, COPD and PAD. Patient is a resident in a SNF. Patient could not or would not give any history and was resistant to management. Collateral information revealed that the patient developed dysarthria the day prior to admission. CT head revealed only chronic ischemic changes, but MRI confirmed an acute 16 x 5 mm infarct of the LEFT posterior limb of the internal capuse. MRA demonstrated some moderate luminal irregularities but no large vessel occlusions or high grade stenosis. He is awaiting ECHO and carotid duplex. Telemetry: SR. Once ECHO and carotid duplex are complete, anticipate discharge back to SNF Wednesday to continue PT/OT and speech therapy.   Discharge Diagnoses:  Dysarthria due to acute 16 x 5 mm infarct of the LEFT posterior limb of the internal capuse.  - MRA demonstrated some moderate luminal irregularities but no large vessel occlusions or high grade stenosis.  - MR Brain--L PLIC infarct. small vessel disease - Telemetry: NSR - Carotid duplex--no hemodynamically significant stenosis - ECHO--EF normal, grade 1 twice a day, no WMA - Aspirin 325mg  daily and high dose statin - Lipid panel: LDL 68--continue lipitor 40 mg daily - A1c--4.7 - PT/OT/SLP recommending return to SNF.  - Neurology assistance appreciated - UA negative -Dys 3 diet  Essential hypertension, blood pressure mildly elevated - Continue metoprolol 12.5 mg bid -d/c  amlodipine and monitor  Suspected dementia, with agitation  - Continue seroquel  - Additional dose of seroquel to allow [place] to complete our last tests (ECHO and carotid duplex) - Continue donepezil  COPD, stable on RA, continue dulera  GERD, stable, continue PPI and carafate  Iron deficiency, stable, continue iron supplementation  CKD stage 3, creatinine stable near 1.4-1.6  BPH, stable, continue flomax  Normocytic anemia, likely of chronic kidney disease. Hemoglobin near baseline.    Discharge Instructions      Discharge Instructions    Ambulatory referral to Neurology    Complete by:  As directed   Follow up with Cecille Rubin, NP, at [facility] in about 2 months. Thanks.     Diet - low sodium heart healthy    Complete by:  As directed      Increase activity slowly    Complete by:  As directed             Medication List    STOP taking these medications        amLODipine 10 MG tablet  Commonly known as:  NORVASC     furosemide 20 MG tablet  Commonly known as:  LASIX     potassium chloride 10 MEQ tablet  Commonly known as:  K-DUR      TAKE these medications        albuterol (2.5 MG/3ML) 0.083% nebulizer solution  Commonly known as:  PROVENTIL  Take 3 mLs (2.5 mg total) by nebulization every 4 (four) hours as needed for wheezing or shortness of breath.     aspirin 325 MG EC tablet  Take 1 tablet (325 mg total) by mouth daily.     atorvastatin 40 MG tablet  Commonly known  as:  LIPITOR  Take 40 mg by mouth daily.     donepezil 5 MG tablet  Commonly known as:  ARICEPT  Take 5 mg by mouth at bedtime.     ferrous sulfate 325 (65 FE) MG tablet  Take 1 tablet (325 mg total) by mouth 2 (two) times daily with a meal.     Fluticasone-Salmeterol 100-50 MCG/DOSE Aepb  Commonly known as:  ADVAIR  Inhale 1 puff into the lungs 2 (two) times daily.     ipratropium 0.02 % nebulizer solution  Commonly known as:  ATROVENT  Take 0.5 mg by nebulization every 4  (four) hours.     Melatonin 3 MG Caps  Take 3 mg by mouth every evening.     metoprolol tartrate 25 MG tablet  Commonly known as:  LOPRESSOR  Take 12.5 mg by mouth 2 (two) times daily.     pantoprazole 40 MG tablet  Commonly known as:  PROTONIX  Take 1 tablet (40 mg total) by mouth daily.     PRESCRIPTION MEDICATION  Take 120 mLs by mouth 2 (two) times daily. MedPass     QUEtiapine 100 MG tablet  Commonly known as:  SEROQUEL  Take 100 mg by mouth at bedtime.     sorbitol 70 % Soln  Take 30 mLs by mouth daily as needed for moderate constipation.     sucralfate 1 g tablet  Commonly known as:  CARAFATE  Take 1 tablet (1 g total) by mouth 4 (four) times daily -  with meals and at bedtime.     tamsulosin 0.4 MG Caps capsule  Commonly known as:  FLOMAX  Take 0.4 mg by mouth daily.       Follow-up Information    Follow up with Dennie Bible, NP. Schedule an appointment as soon as possible for a visit in 2 months.   Specialty:  Family Medicine   Why:  stroke clinic   Contact information:   [address] [place] [address] [phone]      No Known Allergies  Consultations:  neurology   Procedures/Studies: Dg Chest 2 View  07/05/2015  CLINICAL DATA:  Dysarthria EXAM: CHEST  2 VIEW COMPARISON:  07/23/2014 FINDINGS: Cardiomediastinal silhouette is stable. Mild hyperinflation. No acute infiltrate or pleural effusion. No pulmonary edema. Mild degenerative changes mid thoracic spine. IMPRESSION: No active disease.  Mild hyperinflation. Electronically Signed   By: Lahoma Crocker M.D.   On: 07/05/2015 14:48   Ct Head Wo Contrast  07/05/2015  CLINICAL DATA:  Dysarthric speech EXAM: CT HEAD WITHOUT CONTRAST TECHNIQUE: Contiguous axial images were obtained from the base of the skull through the vertex without intravenous contrast. COMPARISON:  06/25/2014 FINDINGS: Mild low attenuation within the subcortical and periventricular [place] matter is identified  compatible with chronic microvascular disease and brain atrophy. No abnormal extra-axial fluid collection, intracranial hemorrhage or mass identified. There is prominence of the sulci and ventricles compatible with brain atrophy. The patient is status post right median antrectomy and right ethmoidectomy. The sinuses appear clear. The mastoid air cells are clear. The calvarium is intact. IMPRESSION: 1. No acute intracranial abnormalities. 2. Chronic microvascular disease and brain atrophy Electronically Signed   By: Kerby Moors M.D.   On: 07/05/2015 15:19   Mr Brain Wo Contrast  07/05/2015  CLINICAL DATA:  Dysarthria beginning yesterday, not improving. Suspect stroke. History of hypertension, hyperlipidemia and chronic kidney disease. EXAM: MRI HEAD WITHOUT CONTRAST MRA HEAD WITHOUT CONTRAST TECHNIQUE: Multiplanar, multiecho pulse  sequences of the brain and surrounding structures were obtained without intravenous contrast. Angiographic images of the head were obtained using MRA technique without contrast. COMPARISON:  CT HEAD July 05, 2015 at 1503 hours an MRI of the head March 14, 2014 FINDINGS: MRI HEAD FINDINGS INTRACRANIAL CONTENTS: 16 x 5 mm reduced diffusion LEFT posterior limb of the internal capsule with corresponding low ADC values. No susceptibility artifact to suggest hemorrhage. The ventricles and sulci are normal for patient's age. No suspicious parenchymal signal, masses or mass effect. Patchy supratentorial pontine [place] matter FLAIR T2 hyperintensities. No abnormal extra-axial fluid collections. No extra-axial masses though, contrast enhanced sequences would be more sensitive. Normal major intracranial vascular flow voids present at skull base. ORBITS: The included ocular globes and orbital contents are non-suspicious. SINUSES: Status post RIGHT maxillary antrectomy, uncinectomy and RIGHT ethmoidectomies. Trace paranasal sinus mucosal thickening. Mastoid air cells are well aerated. SKULL/SOFT  TISSUES: No abnormal sellar expansion. No suspicious calvarial bone marrow signal. Craniocervical junction maintained. MRA HEAD FINDINGS ANTERIOR CIRCULATION: Normal flow related enhancement of the included cervical, petrous, cavernous and supraclinoid internal carotid arteries. Mild luminal irregularity of the carotid siphon corresponding to calcific atherosclerosis on today's head CT. Normal flow related enhancement of the anterior and middle cerebral arteries, including distal segments. No large vessel occlusion, high-grade stenosis, abnormal luminal irregularity, aneurysm. POSTERIOR CIRCULATION: RIGHT vertebral artery is dominant. LEFT vertebral artery terminates in the posterior inferior cerebellar artery. Moderate irregularity of the basilar artery in part may be technical as there is decreased flow related enhancement through the pre pontine cistern associated with noise. Normal flow related enhancement of the main branch vessels. Robust bilateral posterior communicating arteries with tiny P1 segments on developmental basis. Normal flow related enhancement of the posterior cerebral arteries. No large vessel occlusion, high-grade stenosis, abnormal luminal irregularity, aneurysm. IMPRESSION: MRI HEAD: Acute 16 x 5 mm infarct LEFT posterior limb of the internal capsule. Involutional changes. Moderate chronic small vessel ischemic disease. MRA HEAD: No emergent large vessel occlusion or high-grade stenosis. Moderate luminal irregularity of the basilar artery suggests atherosclerosis and, in part artifact. Complete circle of Willis. Electronically Signed   By: Elon Alas M.D.   On: 07/05/2015 23:23   Mr Lovenia Kim  07/05/2015  CLINICAL DATA:  Dysarthria beginning yesterday, not improving. Suspect stroke. History of hypertension, hyperlipidemia and chronic kidney disease. EXAM: MRI HEAD WITHOUT CONTRAST MRA HEAD WITHOUT CONTRAST TECHNIQUE: Multiplanar, multiecho pulse sequences of the brain and surrounding  structures were obtained without intravenous contrast. Angiographic images of the head were obtained using MRA technique without contrast. COMPARISON:  CT HEAD July 05, 2015 at 1503 hours an MRI of the head March 14, 2014 FINDINGS: MRI HEAD FINDINGS INTRACRANIAL CONTENTS: 16 x 5 mm reduced diffusion LEFT posterior limb of the internal capsule with corresponding low ADC values. No susceptibility artifact to suggest hemorrhage. The ventricles and sulci are normal for patient's age. No suspicious parenchymal signal, masses or mass effect. Patchy supratentorial pontine [place] matter FLAIR T2 hyperintensities. No abnormal extra-axial fluid collections. No extra-axial masses though, contrast enhanced sequences would be more sensitive. Normal major intracranial vascular flow voids present at skull base. ORBITS: The included ocular globes and orbital contents are non-suspicious. SINUSES: Status post RIGHT maxillary antrectomy, uncinectomy and RIGHT ethmoidectomies. Trace paranasal sinus mucosal thickening. Mastoid air cells are well aerated. SKULL/SOFT TISSUES: No abnormal sellar expansion. No suspicious calvarial bone marrow signal. Craniocervical junction maintained. MRA HEAD FINDINGS ANTERIOR CIRCULATION: Normal flow related enhancement of the included cervical, petrous,  cavernous and supraclinoid internal carotid arteries. Mild luminal irregularity of the carotid siphon corresponding to calcific atherosclerosis on today's head CT. Normal flow related enhancement of the anterior and middle cerebral arteries, including distal segments. No large vessel occlusion, high-grade stenosis, abnormal luminal irregularity, aneurysm. POSTERIOR CIRCULATION: RIGHT vertebral artery is dominant. LEFT vertebral artery terminates in the posterior inferior cerebellar artery. Moderate irregularity of the basilar artery in part may be technical as there is decreased flow related enhancement through the pre pontine cistern associated with  noise. Normal flow related enhancement of the main branch vessels. Robust bilateral posterior communicating arteries with tiny P1 segments on developmental basis. Normal flow related enhancement of the posterior cerebral arteries. No large vessel occlusion, high-grade stenosis, abnormal luminal irregularity, aneurysm. IMPRESSION: MRI HEAD: Acute 16 x 5 mm infarct LEFT posterior limb of the internal capsule. Involutional changes. Moderate chronic small vessel ischemic disease. MRA HEAD: No emergent large vessel occlusion or high-grade stenosis. Moderate luminal irregularity of the basilar artery suggests atherosclerosis and, in part artifact. Complete circle of Willis. Electronically Signed   By: Elon Alas M.D.   On: 07/05/2015 23:23        Discharge Exam: Filed Vitals:   07/08/15 0126 07/08/15 0532  BP: 131/78 126/82  Pulse: 83 87  Temp: 98.2 F (36.8 C) 98.3 F (36.8 C)  Resp: 18 18   Filed Vitals:   07/07/15 2313 07/08/15 0126 07/08/15 0532 07/08/15 0823  BP: 137/68 131/78 126/82   Pulse: 67 83 87   Temp:  98.2 F (36.8 C) 98.3 F (36.8 C)   TempSrc:  Oral Oral   Resp:  18 18   Height: 6' (1.829 m)     Weight: 62.007 kg (136 lb 11.2 oz)     SpO2:  100% 100% 98%    General: Pt is alert, awake, not in acute distress Cardiovascular: RRR, S1/S2 +, no rubs, no gallops Respiratory: CTA bilaterally, no wheezing, no rhonchi Abdominal: Soft, NT, ND, bowel sounds + Extremities: no edema, no cyanosis   The results of significant diagnostics from this hospitalization (including imaging, microbiology, ancillary and laboratory) are listed below for reference.    Significant Diagnostic Studies: Dg Chest 2 View  07/05/2015  CLINICAL DATA:  Dysarthria EXAM: CHEST  2 VIEW COMPARISON:  07/23/2014 FINDINGS: Cardiomediastinal silhouette is stable. Mild hyperinflation. No acute infiltrate or pleural effusion. No pulmonary edema. Mild degenerative changes mid thoracic spine. IMPRESSION:  No active disease.  Mild hyperinflation. Electronically Signed   By: Lahoma Crocker M.D.   On: 07/05/2015 14:48   Ct Head Wo Contrast  07/05/2015  CLINICAL DATA:  Dysarthric speech EXAM: CT HEAD WITHOUT CONTRAST TECHNIQUE: Contiguous axial images were obtained from the base of the skull through the vertex without intravenous contrast. COMPARISON:  06/25/2014 FINDINGS: Mild low attenuation within the subcortical and periventricular [place] matter is identified compatible with chronic microvascular disease and brain atrophy. No abnormal extra-axial fluid collection, intracranial hemorrhage or mass identified. There is prominence of the sulci and ventricles compatible with brain atrophy. The patient is status post right median antrectomy and right ethmoidectomy. The sinuses appear clear. The mastoid air cells are clear. The calvarium is intact. IMPRESSION: 1. No acute intracranial abnormalities. 2. Chronic microvascular disease and brain atrophy Electronically Signed   By: Kerby Moors M.D.   On: 07/05/2015 15:19   Mr Brain Wo Contrast  07/05/2015  CLINICAL DATA:  Dysarthria beginning yesterday, not improving. Suspect stroke. History of hypertension, hyperlipidemia and chronic kidney disease. EXAM:  MRI HEAD WITHOUT CONTRAST MRA HEAD WITHOUT CONTRAST TECHNIQUE: Multiplanar, multiecho pulse sequences of the brain and surrounding structures were obtained without intravenous contrast. Angiographic images of the head were obtained using MRA technique without contrast. COMPARISON:  CT HEAD July 05, 2015 at 1503 hours an MRI of the head March 14, 2014 FINDINGS: MRI HEAD FINDINGS INTRACRANIAL CONTENTS: 16 x 5 mm reduced diffusion LEFT posterior limb of the internal capsule with corresponding low ADC values. No susceptibility artifact to suggest hemorrhage. The ventricles and sulci are normal for patient's age. No suspicious parenchymal signal, masses or mass effect. Patchy supratentorial pontine [place] matter FLAIR T2  hyperintensities. No abnormal extra-axial fluid collections. No extra-axial masses though, contrast enhanced sequences would be more sensitive. Normal major intracranial vascular flow voids present at skull base. ORBITS: The included ocular globes and orbital contents are non-suspicious. SINUSES: Status post RIGHT maxillary antrectomy, uncinectomy and RIGHT ethmoidectomies. Trace paranasal sinus mucosal thickening. Mastoid air cells are well aerated. SKULL/SOFT TISSUES: No abnormal sellar expansion. No suspicious calvarial bone marrow signal. Craniocervical junction maintained. MRA HEAD FINDINGS ANTERIOR CIRCULATION: Normal flow related enhancement of the included cervical, petrous, cavernous and supraclinoid internal carotid arteries. Mild luminal irregularity of the carotid siphon corresponding to calcific atherosclerosis on today's head CT. Normal flow related enhancement of the anterior and middle cerebral arteries, including distal segments. No large vessel occlusion, high-grade stenosis, abnormal luminal irregularity, aneurysm. POSTERIOR CIRCULATION: RIGHT vertebral artery is dominant. LEFT vertebral artery terminates in the posterior inferior cerebellar artery. Moderate irregularity of the basilar artery in part may be technical as there is decreased flow related enhancement through the pre pontine cistern associated with noise. Normal flow related enhancement of the main branch vessels. Robust bilateral posterior communicating arteries with tiny P1 segments on developmental basis. Normal flow related enhancement of the posterior cerebral arteries. No large vessel occlusion, high-grade stenosis, abnormal luminal irregularity, aneurysm. IMPRESSION: MRI HEAD: Acute 16 x 5 mm infarct LEFT posterior limb of the internal capsule. Involutional changes. Moderate chronic small vessel ischemic disease. MRA HEAD: No emergent large vessel occlusion or high-grade stenosis. Moderate luminal irregularity of the basilar  artery suggests atherosclerosis and, in part artifact. Complete circle of Willis. Electronically Signed   By: Elon Alas M.D.   On: 07/05/2015 23:23   Mr Lovenia Kim  07/05/2015  CLINICAL DATA:  Dysarthria beginning yesterday, not improving. Suspect stroke. History of hypertension, hyperlipidemia and chronic kidney disease. EXAM: MRI HEAD WITHOUT CONTRAST MRA HEAD WITHOUT CONTRAST TECHNIQUE: Multiplanar, multiecho pulse sequences of the brain and surrounding structures were obtained without intravenous contrast. Angiographic images of the head were obtained using MRA technique without contrast. COMPARISON:  CT HEAD July 05, 2015 at 1503 hours an MRI of the head March 14, 2014 FINDINGS: MRI HEAD FINDINGS INTRACRANIAL CONTENTS: 16 x 5 mm reduced diffusion LEFT posterior limb of the internal capsule with corresponding low ADC values. No susceptibility artifact to suggest hemorrhage. The ventricles and sulci are normal for patient's age. No suspicious parenchymal signal, masses or mass effect. Patchy supratentorial pontine [place] matter FLAIR T2 hyperintensities. No abnormal extra-axial fluid collections. No extra-axial masses though, contrast enhanced sequences would be more sensitive. Normal major intracranial vascular flow voids present at skull base. ORBITS: The included ocular globes and orbital contents are non-suspicious. SINUSES: Status post RIGHT maxillary antrectomy, uncinectomy and RIGHT ethmoidectomies. Trace paranasal sinus mucosal thickening. Mastoid air cells are well aerated. SKULL/SOFT TISSUES: No abnormal sellar expansion. No suspicious calvarial bone marrow signal. Craniocervical junction maintained. MRA HEAD  FINDINGS ANTERIOR CIRCULATION: Normal flow related enhancement of the included cervical, petrous, cavernous and supraclinoid internal carotid arteries. Mild luminal irregularity of the carotid siphon corresponding to calcific atherosclerosis on today's head CT. Normal flow related  enhancement of the anterior and middle cerebral arteries, including distal segments. No large vessel occlusion, high-grade stenosis, abnormal luminal irregularity, aneurysm. POSTERIOR CIRCULATION: RIGHT vertebral artery is dominant. LEFT vertebral artery terminates in the posterior inferior cerebellar artery. Moderate irregularity of the basilar artery in part may be technical as there is decreased flow related enhancement through the pre pontine cistern associated with noise. Normal flow related enhancement of the main branch vessels. Robust bilateral posterior communicating arteries with tiny P1 segments on developmental basis. Normal flow related enhancement of the posterior cerebral arteries. No large vessel occlusion, high-grade stenosis, abnormal luminal irregularity, aneurysm. IMPRESSION: MRI HEAD: Acute 16 x 5 mm infarct LEFT posterior limb of the internal capsule. Involutional changes. Moderate chronic small vessel ischemic disease. MRA HEAD: No emergent large vessel occlusion or high-grade stenosis. Moderate luminal irregularity of the basilar artery suggests atherosclerosis and, in part artifact. Complete circle of Willis. Electronically Signed   By: Elon Alas M.D.   On: 07/05/2015 23:23     Microbiology: No results found for this or any previous visit (from the past 240 hour(s)).   Labs: Basic Metabolic Panel:  Recent Labs Lab 07/05/15 1435 07/05/15 1448 07/05/15 1654 07/06/15 0601 07/08/15 0535  NA 142 145  --  145 145  K 4.5 4.3  --  3.9 3.7  CL 107 103  --  107 106  CO2 27  --   --  31 29  GLUCOSE 100* 98  --  75 82  BUN 25* 34*  --  22* 26*  CREATININE 1.56* 1.60* 1.51* 1.45* 1.62*  CALCIUM 9.7  --   --  9.8 9.5  MG  --   --  1.9  --  1.9  PHOS  --   --  3.0  --   --    Liver Function Tests:  Recent Labs Lab 07/05/15 1435  AST 32  ALT 18  ALKPHOS 60  BILITOT 0.8  PROT 7.5  ALBUMIN 4.4   No results for input(s): LIPASE, AMYLASE in the last 168 hours. No  results for input(s): AMMONIA in the last 168 hours. CBC:  Recent Labs Lab 07/05/15 1435 07/05/15 1448 07/05/15 1654 07/06/15 0601  WBC 7.8  --  8.3 7.5  NEUTROABS 5.7  --   --   --   HGB 10.8* 11.9* 10.4* 10.4*  HCT 34.6* 35.0* 33.4* 33.7*  MCV 86.3  --  86.3 85.8  PLT 249  --  198 232   Cardiac Enzymes: No results for input(s): CKTOTAL, CKMB, CKMBINDEX, TROPONINI in the last 168 hours. BNP: Invalid input(s): POCBNP CBG: No results for input(s): GLUCAP in the last 168 hours.  Time coordinating discharge:  Greater than 30 minutes  Signed:  Shaden Higley, DO [facility] Pager: [phone] 07/08/2015, 9:31 AM

## 2015-07-08 NOTE — Clinical Social Work Note (Signed)
Patient to be d/c'ed today to [facility].  Patient and family agreeable to plans will transport via ems RN to call report to [phone].  Evette Cristal, MSW, [place]

## 2015-07-08 NOTE — Care Management Note (Signed)
Case Management Note  Patient Details  Name: Chase Johnson MRN: [id number] Date of Birth: Jul 26, 1930  Subjective/Objective:                    Action/Plan: Pt discharging back to [facility] today. No further needs per CM.   Expected Discharge Date:                  Expected Discharge Plan:  [facility]  In-House Referral:     Discharge planning Services  CM Consult  Post Acute Care Choice:    Choice offered to:     DME Arranged:    DME Agency:     HH Arranged:    [place] Agency:     Status of Service:  In process, will continue to follow  If discussed at Long Length of Stay Meetings, dates discussed:    Additional Comments:  Pollie Friar, RN 07/08/2015, 10:41 AM

## 2015-07-09 LAB — VAS US CAROTID
LEFT ECA DIAS: -4 cm/s
LEFT VERTEBRAL DIAS: 6 cm/s
Left CCA dist dias: -12 cm/s
Left CCA dist sys: -66 cm/s
Left CCA prox dias: 12 cm/s
Left CCA prox sys: 81 cm/s
Left ICA dist dias: -23 cm/s
Left ICA dist sys: -74 cm/s
Left ICA prox dias: -12 cm/s
Left ICA prox sys: -72 cm/s
RIGHT ECA DIAS: -1 cm/s
RIGHT VERTEBRAL DIAS: -12 cm/s
Right CCA prox dias: 11 cm/s
Right CCA prox sys: 88 cm/s
Right cca dist sys: -58 cm/s

## 2015-07-12 DIAGNOSIS — I129 Hypertensive chronic kidney disease with stage 1 through stage 4 chronic kidney disease, or unspecified chronic kidney disease: Secondary | ICD-10-CM | POA: Diagnosis not present

## 2015-07-12 DIAGNOSIS — I739 Peripheral vascular disease, unspecified: Secondary | ICD-10-CM | POA: Diagnosis not present

## 2015-07-12 DIAGNOSIS — I638 Other cerebral infarction: Secondary | ICD-10-CM | POA: Diagnosis not present

## 2015-07-12 DIAGNOSIS — I10 Essential (primary) hypertension: Secondary | ICD-10-CM | POA: Diagnosis not present

## 2015-07-22 DIAGNOSIS — J449 Chronic obstructive pulmonary disease, unspecified: Secondary | ICD-10-CM | POA: Diagnosis not present

## 2015-07-22 DIAGNOSIS — I10 Essential (primary) hypertension: Secondary | ICD-10-CM | POA: Diagnosis not present

## 2015-07-22 DIAGNOSIS — R05 Cough: Secondary | ICD-10-CM | POA: Diagnosis not present

## 2015-07-22 DIAGNOSIS — K219 Gastro-esophageal reflux disease without esophagitis: Secondary | ICD-10-CM | POA: Diagnosis not present

## 2015-09-14 ENCOUNTER — Ambulatory Visit: Payer: Medicare Other | Admitting: Nurse Practitioner

## 2015-09-15 ENCOUNTER — Encounter: Payer: Self-pay | Admitting: Nurse Practitioner

## 2015-09-20 ENCOUNTER — Ambulatory Visit (INDEPENDENT_AMBULATORY_CARE_PROVIDER_SITE_OTHER): Payer: Medicare Other | Admitting: Nurse Practitioner

## 2015-09-20 ENCOUNTER — Telehealth: Payer: Self-pay | Admitting: Nurse Practitioner

## 2015-09-20 ENCOUNTER — Encounter: Payer: Self-pay | Admitting: Nurse Practitioner

## 2015-09-20 VITALS — BP 140/82 | HR 70 | Ht 72.0 in

## 2015-09-20 DIAGNOSIS — I10 Essential (primary) hypertension: Secondary | ICD-10-CM | POA: Diagnosis not present

## 2015-09-20 DIAGNOSIS — E785 Hyperlipidemia, unspecified: Secondary | ICD-10-CM

## 2015-09-20 DIAGNOSIS — I639 Cerebral infarction, unspecified: Secondary | ICD-10-CM | POA: Diagnosis not present

## 2015-09-20 NOTE — Progress Notes (Signed)
[facility]  PATIENT: Chase Johnson DOB: Aug 01, 1930   REASON FOR VISIT: Hospital follow-up for left PLIC infarct secondary to small vessel disease HISTORY FROM: Patient and power of attorney Zenaida Niece Cray    HISTORY OF PRESENT ILLNESS:Chase Johnson is an 82 y.o. male who resides in a nursing home and was noted to have dysarthric speech yesterday 07/05/2015 (LKW, time unknown). He was brought to hospital 07/05/15 due to not improving. He states he became very upset yesterday and suddenly noted he had slurred speech. Currently he wants to leave but understands he likely had a CVA. Patient was not administered IV t-PA secondary to unknown LKW. He was admitted for further evaluation and treatment. MRI leftPLIC infarct secondary to small vessel disease and atrophy. MRA no significant stenosis. Carotid Doppler unremarkable. 2-D echo unremarkable. LDL 68. Hemoglobin A1c 4.7. He returns for follow-up today denying further stroke or TIA symptoms. His medicine list was obtained from his nursing home [facility]. No progress note was sent with patient. He is currently on aspirin for secondary stroke prevention with minimal bruising. He returns for reevaluation. He is seated in wheelchair   REVIEW OF SYSTEMS: Full 14 system review of systems performed and notable only for those listed, all others are neg:  Constitutional: neg  Cardiovascular: neg Ear/Nose/Throat: Runny nose Skin: neg Eyes: neg Respiratory: neg Gastroitestinal: Black stools neg for occult blood Hematology/Lymphatic: neg  Endocrine: neg Musculoskeletal:neg Allergy/Immunology: neg Neurological: Headache, Psychiatric: Behavior problem confusion Sleep : Restless leg   ALLERGIES: No Known Allergies  HOME MEDICATIONS: Outpatient Medications Prior to Visit  Medication Sig Dispense Refill  . aspirin EC 325 MG EC tablet Take 1 tablet (325 mg total) by mouth daily. 30 tablet 0  . atorvastatin (LIPITOR) 40 MG tablet  Take 40 mg by mouth daily.  1  . donepezil (ARICEPT) 5 MG tablet Take 5 mg by mouth at bedtime.  1  . ferrous sulfate 325 (65 FE) MG tablet Take 1 tablet (325 mg total) by mouth 2 (two) times daily with a meal.  0  . Fluticasone-Salmeterol (ADVAIR) 100-50 MCG/DOSE AEPB Inhale 1 puff into the lungs 2 (two) times daily.    . Melatonin 3 MG CAPS Take 3 mg by mouth every evening.    . metoprolol tartrate (LOPRESSOR) 25 MG tablet Take 12.5 mg by mouth 2 (two) times daily.    . pantoprazole (PROTONIX) 40 MG tablet Take 1 tablet (40 mg total) by mouth daily. 60 tablet 0  . QUEtiapine (SEROQUEL) 100 MG tablet Take 100 mg by mouth at bedtime.    . sucralfate (CARAFATE) 1 G tablet Take 1 tablet (1 g total) by mouth 4 (four) times daily -  with meals and at bedtime. (Patient taking differently: Take 1 g by mouth 3 (three) times daily before meals. ) 56 tablet 0  . tamsulosin (FLOMAX) 0.4 MG CAPS capsule Take 0.4 mg by mouth daily.     Marland Kitchen albuterol (PROVENTIL) (2.5 MG/3ML) 0.083% nebulizer solution Take 3 mLs (2.5 mg total) by nebulization every 4 (four) hours as needed for wheezing or shortness of breath. (Patient not taking: Reported on 09/20/2015) 30 vial 0  . ipratropium (ATROVENT) 0.02 % nebulizer solution Take 0.5 mg by nebulization every 4 (four) hours.    Marland Kitchen PRESCRIPTION MEDICATION Take 120 mLs by mouth 2 (two) times daily. MedPass    . sorbitol 70 % SOLN Take 30 mLs by mouth daily as needed for moderate constipation. (Patient not taking: Reported on 09/20/2015) 1000  mL 0   No facility-administered medications prior to visit.     PAST MEDICAL HISTORY: Past Medical History:  Diagnosis Date  . BPH (benign prostatic hyperplasia)   . CAD (coronary artery disease)   . CKD (chronic kidney disease)   . COPD (chronic obstructive pulmonary disease) ([place])   . Dysarthria 07/05/2015  . HTN (hypertension)   . Hyperlipidemia   . Peripheral arterial disease ([place])   . PTSD (post-traumatic stress disorder)   .  Respiratory difficulty 07/05/2015  . Tobacco abuse     PAST SURGICAL HISTORY: Past Surgical History:  Procedure Laterality Date  . ESOPHAGOGASTRODUODENOSCOPY N/A 06/28/2014   Procedure: ESOPHAGOGASTRODUODENOSCOPY (EGD);  Surgeon: Laurence Spates, MD;  Location: [facility] ENDOSCOPY;  Service: Endoscopy;  Laterality: N/A;  . PROSTATE SURGERY     for BPH, but not know the detail by pt    FAMILY HISTORY: Family History  Problem Relation Age of Onset  . Heart attack Father   . Hyperlipidemia Father   . Hypertension Father   . Stroke Father     SOCIAL HISTORY: Social History   Social History  . Marital status: Married    Spouse name: N/A  . Number of children: N/A  . Years of education: N/A   Occupational History  . Not on file.   Social History Main Topics  . Smoking status: Former Smoker    Types: Cigarettes    Quit date: 12/01/2012  . Smokeless tobacco: Never Used  . Alcohol use No  . Drug use: Unknown  . Sexual activity: Not on file   Other Topics Concern  . Not on file   Social History Narrative   [place], [place]   Residing at [facility] [phone]   On oxygen 3L [place]   Uses  BiPap        PHYSICAL EXAM B/P 140/82, P 70 )2 sat 94%  There is no height or weight on file to calculate BMI.  Generalized: Well developed, in no acute distress. Oxygen in place  Head: normocephalic and atraumatic,. Oropharynx benign  Neck: Supple, no carotid bruits  Cardiac: Regular rate rhythm, no murmur  Musculoskeletal: No deformity   Neurological examination   Mentation: Alert oriented to time, place, history taking. Attention span and concentration appropriate.   Follows all commands speech and language fluent. No dysarthria  Cranial nerve II-XII: Fundoscopic exam not done .Pupils were equal round reactive to light extraocular movements were full, visual field were full on confrontational test. Mild right facial droop . hearing was intact to finger rubbing bilaterally. Uvula tongue  midline. head turning and shoulder shrug were normal and symmetric.Tongue protrusion into cheek strength was normal. Motor: normal bulk and tone, full strength in the BUE, BLE, fine finger movements normal, no pronator drift. No focal weakness Sensory: normal and symmetric to light touch, pinprick, and  Vibration,In the upper and lower extremities Coordination: finger-nose-finger, heel-to-shin bilaterally, no dysmetria Reflexes:Symmetric upper and lower plantar responses were flexor bilaterally. Gait and Station:In wheelchair not ambulated  DIAGNOSTIC DATA (LABS, IMAGING, TESTING) - I reviewed patient records, labs, notes, testing and imaging myself where available.  Lab Results  Component Value Date   WBC 7.5 07/06/2015   HGB 10.4 (L) 07/06/2015   HCT 33.7 (L) 07/06/2015   MCV 85.8 07/06/2015   PLT 232 07/06/2015      Component Value Date/Time   NA 145 07/08/2015 0535   K 3.7 07/08/2015 0535   CL 106 07/08/2015 0535   CO2 29 07/08/2015 0535  GLUCOSE 82 07/08/2015 0535   BUN 26 (H) 07/08/2015 0535   CREATININE 1.62 (H) 07/08/2015 0535   CALCIUM 9.5 07/08/2015 0535   PROT 7.5 07/05/2015 1435   ALBUMIN 4.4 07/05/2015 1435   AST 32 07/05/2015 1435   ALT 18 07/05/2015 1435   ALKPHOS 60 07/05/2015 1435   BILITOT 0.8 07/05/2015 1435   GFRNONAA 38 (L) 07/08/2015 0535   GFRAA 44 (L) 07/08/2015 0535   Lab Results  Component Value Date   CHOL 128 07/06/2015   HDL 52 07/06/2015   LDLCALC 68 07/06/2015   TRIG 42 07/06/2015   CHOLHDL 2.5 07/06/2015   Lab Results  Component Value Date   HGBA1C 4.7 (L) 07/06/2015       ASSESSMENT AND PLAN  82 y.o. year old male  has a past medical history of  CAD (coronary artery disease); CKD (chronic kidney disease); COPD (chronic obstructive pulmonary disease) ([facility]);  HTN (hypertension); Hyperlipidemia; Peripheral arterial disease ([place]);  here for hospital follow-up for stroke MRI leftPLIC infarct secondary to small vessel disease and  atrophy. MRA no significant stenosis. Carotid Doppler unremarkable. 2-D echo unremarkable. LDL 68. Hemoglobin A1c 4.7    Stressed the importance of management of risk factors to prevent further stroke Pt lives at nursing facility will fax note to them.  Continue Asa for secondary stroke prevention Maintain strict control of hypertension with blood pressure goal below 130/90, continue antihypertensive medications Cholesterol with LDL cholesterol less than 70,  continue statin drugs Lipitor Follow up 4 months Dennie Bible, [facility], [facility], APRN  [facility] [address] [place], [place] [address] [phone]

## 2015-09-20 NOTE — Telephone Encounter (Signed)
Per pt and family at check out. Pt and family want to see physician that follows from the hospital visit. He does not know who saw him or what physician he is to see here. Pt will return for visit once he is told which physician to fu with.

## 2015-09-20 NOTE — Telephone Encounter (Signed)
He was seen by Dr. Erlinda Hong in the hospital. Please put a note in the system not to follow up with me. Thanks

## 2015-09-20 NOTE — Patient Instructions (Addendum)
Stressed the importance of management of risk factors to prevent further stroke This will be sent to nursing home. No progress note with patient today.  Continue Asa for secondary stroke prevention Maintain strict control of hypertension with blood pressure goal below 130/90, continue antihypertensive medications Cholesterol with LDL cholesterol less than 70,  continue statin drugs Lipitor Follow up 4 months

## 2015-09-21 NOTE — Progress Notes (Signed)
I reviewed above note and agree with the assessment and plan.  Rosalin Hawking, MD PhD Stroke Neurology 09/21/2015 7:14 AM

## 2015-09-21 NOTE — Telephone Encounter (Signed)
Called pt POA [name] LVM to ret call to discuss scheduling FU appt with Dr. Erlinda Hong

## 2015-09-22 NOTE — Telephone Encounter (Signed)
Noted  

## 2015-09-22 NOTE — Progress Notes (Signed)
Fax confirmation received,  [facility] facility where pt resides.  [phone], [phone].

## 2015-09-23 DIAGNOSIS — J440 Chronic obstructive pulmonary disease with acute lower respiratory infection: Secondary | ICD-10-CM | POA: Diagnosis not present

## 2015-09-23 DIAGNOSIS — I638 Other cerebral infarction: Secondary | ICD-10-CM | POA: Diagnosis not present

## 2015-09-23 DIAGNOSIS — I10 Essential (primary) hypertension: Secondary | ICD-10-CM | POA: Diagnosis not present

## 2015-09-23 DIAGNOSIS — F0390 Unspecified dementia without behavioral disturbance: Secondary | ICD-10-CM | POA: Diagnosis not present

## 2015-11-16 DIAGNOSIS — I10 Essential (primary) hypertension: Secondary | ICD-10-CM | POA: Diagnosis not present

## 2015-11-16 DIAGNOSIS — E784 Other hyperlipidemia: Secondary | ICD-10-CM | POA: Diagnosis not present

## 2015-11-26 DIAGNOSIS — I10 Essential (primary) hypertension: Secondary | ICD-10-CM | POA: Diagnosis not present

## 2015-11-26 DIAGNOSIS — F0390 Unspecified dementia without behavioral disturbance: Secondary | ICD-10-CM | POA: Diagnosis not present

## 2015-11-26 DIAGNOSIS — E785 Hyperlipidemia, unspecified: Secondary | ICD-10-CM | POA: Diagnosis not present

## 2015-11-26 DIAGNOSIS — I639 Cerebral infarction, unspecified: Secondary | ICD-10-CM | POA: Diagnosis not present

## 2016-01-18 DIAGNOSIS — Z760 Encounter for issue of repeat prescription: Secondary | ICD-10-CM | POA: Diagnosis not present

## 2016-01-18 DIAGNOSIS — K219 Gastro-esophageal reflux disease without esophagitis: Secondary | ICD-10-CM | POA: Diagnosis not present

## 2016-01-18 DIAGNOSIS — I5031 Acute diastolic (congestive) heart failure: Secondary | ICD-10-CM | POA: Diagnosis not present

## 2016-01-18 DIAGNOSIS — I10 Essential (primary) hypertension: Secondary | ICD-10-CM | POA: Diagnosis not present

## 2016-01-26 DIAGNOSIS — I638 Other cerebral infarction: Secondary | ICD-10-CM | POA: Diagnosis not present

## 2016-01-26 DIAGNOSIS — I10 Essential (primary) hypertension: Secondary | ICD-10-CM | POA: Diagnosis not present

## 2016-01-26 DIAGNOSIS — J440 Chronic obstructive pulmonary disease with acute lower respiratory infection: Secondary | ICD-10-CM | POA: Diagnosis not present

## 2016-01-26 DIAGNOSIS — I509 Heart failure, unspecified: Secondary | ICD-10-CM | POA: Diagnosis not present

## 2016-03-17 DIAGNOSIS — I129 Hypertensive chronic kidney disease with stage 1 through stage 4 chronic kidney disease, or unspecified chronic kidney disease: Secondary | ICD-10-CM | POA: Diagnosis not present

## 2016-03-17 DIAGNOSIS — I638 Other cerebral infarction: Secondary | ICD-10-CM | POA: Diagnosis not present

## 2016-03-17 DIAGNOSIS — I10 Essential (primary) hypertension: Secondary | ICD-10-CM | POA: Diagnosis not present

## 2016-03-17 DIAGNOSIS — J449 Chronic obstructive pulmonary disease, unspecified: Secondary | ICD-10-CM | POA: Diagnosis not present

## 2016-03-27 DIAGNOSIS — Z1389 Encounter for screening for other disorder: Secondary | ICD-10-CM | POA: Diagnosis not present

## 2016-04-06 DIAGNOSIS — J449 Chronic obstructive pulmonary disease, unspecified: Secondary | ICD-10-CM | POA: Diagnosis not present

## 2016-04-06 DIAGNOSIS — I638 Other cerebral infarction: Secondary | ICD-10-CM | POA: Diagnosis not present

## 2016-04-06 DIAGNOSIS — N400 Enlarged prostate without lower urinary tract symptoms: Secondary | ICD-10-CM | POA: Diagnosis not present

## 2016-04-06 DIAGNOSIS — D631 Anemia in chronic kidney disease: Secondary | ICD-10-CM | POA: Diagnosis not present

## 2016-04-10 DIAGNOSIS — J449 Chronic obstructive pulmonary disease, unspecified: Secondary | ICD-10-CM | POA: Diagnosis not present

## 2016-04-10 DIAGNOSIS — D631 Anemia in chronic kidney disease: Secondary | ICD-10-CM | POA: Diagnosis not present

## 2016-04-10 DIAGNOSIS — R3915 Urgency of urination: Secondary | ICD-10-CM | POA: Diagnosis not present

## 2016-04-10 DIAGNOSIS — L299 Pruritus, unspecified: Secondary | ICD-10-CM | POA: Diagnosis not present

## 2016-04-11 DIAGNOSIS — Z7901 Long term (current) use of anticoagulants: Secondary | ICD-10-CM | POA: Diagnosis not present

## 2016-04-11 DIAGNOSIS — D649 Anemia, unspecified: Secondary | ICD-10-CM | POA: Diagnosis not present

## 2016-04-24 DIAGNOSIS — J449 Chronic obstructive pulmonary disease, unspecified: Secondary | ICD-10-CM | POA: Diagnosis not present

## 2016-04-24 DIAGNOSIS — I129 Hypertensive chronic kidney disease with stage 1 through stage 4 chronic kidney disease, or unspecified chronic kidney disease: Secondary | ICD-10-CM | POA: Diagnosis not present

## 2016-04-24 DIAGNOSIS — I10 Essential (primary) hypertension: Secondary | ICD-10-CM | POA: Diagnosis not present

## 2016-04-24 DIAGNOSIS — I509 Heart failure, unspecified: Secondary | ICD-10-CM | POA: Diagnosis not present

## 2016-04-25 DIAGNOSIS — R609 Edema, unspecified: Secondary | ICD-10-CM | POA: Diagnosis not present

## 2016-04-26 DIAGNOSIS — I638 Other cerebral infarction: Secondary | ICD-10-CM | POA: Diagnosis not present

## 2016-04-26 DIAGNOSIS — G8929 Other chronic pain: Secondary | ICD-10-CM | POA: Diagnosis not present

## 2016-04-26 DIAGNOSIS — M25512 Pain in left shoulder: Secondary | ICD-10-CM | POA: Diagnosis not present

## 2016-04-26 DIAGNOSIS — M6281 Muscle weakness (generalized): Secondary | ICD-10-CM | POA: Diagnosis not present

## 2016-04-26 DIAGNOSIS — R293 Abnormal posture: Secondary | ICD-10-CM | POA: Diagnosis not present

## 2016-04-27 DIAGNOSIS — R293 Abnormal posture: Secondary | ICD-10-CM | POA: Diagnosis not present

## 2016-04-27 DIAGNOSIS — M25512 Pain in left shoulder: Secondary | ICD-10-CM | POA: Diagnosis not present

## 2016-04-27 DIAGNOSIS — I638 Other cerebral infarction: Secondary | ICD-10-CM | POA: Diagnosis not present

## 2016-04-27 DIAGNOSIS — G8929 Other chronic pain: Secondary | ICD-10-CM | POA: Diagnosis not present

## 2016-04-27 DIAGNOSIS — M6281 Muscle weakness (generalized): Secondary | ICD-10-CM | POA: Diagnosis not present

## 2016-04-28 DIAGNOSIS — R293 Abnormal posture: Secondary | ICD-10-CM | POA: Diagnosis not present

## 2016-04-28 DIAGNOSIS — M6281 Muscle weakness (generalized): Secondary | ICD-10-CM | POA: Diagnosis not present

## 2016-04-28 DIAGNOSIS — G8929 Other chronic pain: Secondary | ICD-10-CM | POA: Diagnosis not present

## 2016-04-28 DIAGNOSIS — I638 Other cerebral infarction: Secondary | ICD-10-CM | POA: Diagnosis not present

## 2016-04-28 DIAGNOSIS — M25512 Pain in left shoulder: Secondary | ICD-10-CM | POA: Diagnosis not present

## 2016-05-01 DIAGNOSIS — M6281 Muscle weakness (generalized): Secondary | ICD-10-CM | POA: Diagnosis not present

## 2016-05-01 DIAGNOSIS — I638 Other cerebral infarction: Secondary | ICD-10-CM | POA: Diagnosis not present

## 2016-05-01 DIAGNOSIS — R293 Abnormal posture: Secondary | ICD-10-CM | POA: Diagnosis not present

## 2016-05-01 DIAGNOSIS — G8929 Other chronic pain: Secondary | ICD-10-CM | POA: Diagnosis not present

## 2016-05-01 DIAGNOSIS — M25512 Pain in left shoulder: Secondary | ICD-10-CM | POA: Diagnosis not present

## 2016-05-02 DIAGNOSIS — R293 Abnormal posture: Secondary | ICD-10-CM | POA: Diagnosis not present

## 2016-05-02 DIAGNOSIS — I638 Other cerebral infarction: Secondary | ICD-10-CM | POA: Diagnosis not present

## 2016-05-02 DIAGNOSIS — M25512 Pain in left shoulder: Secondary | ICD-10-CM | POA: Diagnosis not present

## 2016-05-02 DIAGNOSIS — M6281 Muscle weakness (generalized): Secondary | ICD-10-CM | POA: Diagnosis not present

## 2016-05-03 DIAGNOSIS — I638 Other cerebral infarction: Secondary | ICD-10-CM | POA: Diagnosis not present

## 2016-05-03 DIAGNOSIS — I10 Essential (primary) hypertension: Secondary | ICD-10-CM | POA: Diagnosis not present

## 2016-05-03 DIAGNOSIS — M25512 Pain in left shoulder: Secondary | ICD-10-CM | POA: Diagnosis not present

## 2016-05-03 DIAGNOSIS — D649 Anemia, unspecified: Secondary | ICD-10-CM | POA: Diagnosis not present

## 2016-05-03 DIAGNOSIS — R293 Abnormal posture: Secondary | ICD-10-CM | POA: Diagnosis not present

## 2016-05-03 DIAGNOSIS — M6281 Muscle weakness (generalized): Secondary | ICD-10-CM | POA: Diagnosis not present

## 2016-05-05 DIAGNOSIS — M6281 Muscle weakness (generalized): Secondary | ICD-10-CM | POA: Diagnosis not present

## 2016-05-05 DIAGNOSIS — M25512 Pain in left shoulder: Secondary | ICD-10-CM | POA: Diagnosis not present

## 2016-05-05 DIAGNOSIS — R293 Abnormal posture: Secondary | ICD-10-CM | POA: Diagnosis not present

## 2016-05-05 DIAGNOSIS — I638 Other cerebral infarction: Secondary | ICD-10-CM | POA: Diagnosis not present

## 2016-05-08 DIAGNOSIS — R293 Abnormal posture: Secondary | ICD-10-CM | POA: Diagnosis not present

## 2016-05-08 DIAGNOSIS — I10 Essential (primary) hypertension: Secondary | ICD-10-CM | POA: Diagnosis not present

## 2016-05-08 DIAGNOSIS — I638 Other cerebral infarction: Secondary | ICD-10-CM | POA: Diagnosis not present

## 2016-05-08 DIAGNOSIS — R071 Chest pain on breathing: Secondary | ICD-10-CM | POA: Diagnosis not present

## 2016-05-08 DIAGNOSIS — M25512 Pain in left shoulder: Secondary | ICD-10-CM | POA: Diagnosis not present

## 2016-05-08 DIAGNOSIS — M6281 Muscle weakness (generalized): Secondary | ICD-10-CM | POA: Diagnosis not present

## 2016-05-08 DIAGNOSIS — D649 Anemia, unspecified: Secondary | ICD-10-CM | POA: Diagnosis not present

## 2016-05-09 DIAGNOSIS — R293 Abnormal posture: Secondary | ICD-10-CM | POA: Diagnosis not present

## 2016-05-09 DIAGNOSIS — I638 Other cerebral infarction: Secondary | ICD-10-CM | POA: Diagnosis not present

## 2016-05-09 DIAGNOSIS — M6281 Muscle weakness (generalized): Secondary | ICD-10-CM | POA: Diagnosis not present

## 2016-05-09 DIAGNOSIS — M25512 Pain in left shoulder: Secondary | ICD-10-CM | POA: Diagnosis not present

## 2016-05-10 DIAGNOSIS — I638 Other cerebral infarction: Secondary | ICD-10-CM | POA: Diagnosis not present

## 2016-05-10 DIAGNOSIS — M6281 Muscle weakness (generalized): Secondary | ICD-10-CM | POA: Diagnosis not present

## 2016-05-10 DIAGNOSIS — M25512 Pain in left shoulder: Secondary | ICD-10-CM | POA: Diagnosis not present

## 2016-05-10 DIAGNOSIS — R293 Abnormal posture: Secondary | ICD-10-CM | POA: Diagnosis not present

## 2016-06-01 DIAGNOSIS — J449 Chronic obstructive pulmonary disease, unspecified: Secondary | ICD-10-CM | POA: Diagnosis not present

## 2016-06-01 DIAGNOSIS — I10 Essential (primary) hypertension: Secondary | ICD-10-CM | POA: Diagnosis not present

## 2016-06-01 DIAGNOSIS — F0390 Unspecified dementia without behavioral disturbance: Secondary | ICD-10-CM | POA: Diagnosis not present

## 2016-06-01 DIAGNOSIS — I509 Heart failure, unspecified: Secondary | ICD-10-CM | POA: Diagnosis not present

## 2016-06-05 DIAGNOSIS — I638 Other cerebral infarction: Secondary | ICD-10-CM | POA: Diagnosis not present

## 2016-06-05 DIAGNOSIS — J449 Chronic obstructive pulmonary disease, unspecified: Secondary | ICD-10-CM | POA: Diagnosis not present

## 2016-06-05 DIAGNOSIS — I129 Hypertensive chronic kidney disease with stage 1 through stage 4 chronic kidney disease, or unspecified chronic kidney disease: Secondary | ICD-10-CM | POA: Diagnosis not present

## 2016-06-05 DIAGNOSIS — I10 Essential (primary) hypertension: Secondary | ICD-10-CM | POA: Diagnosis not present

## 2016-06-29 DIAGNOSIS — I10 Essential (primary) hypertension: Secondary | ICD-10-CM | POA: Diagnosis not present

## 2016-06-29 DIAGNOSIS — I509 Heart failure, unspecified: Secondary | ICD-10-CM | POA: Diagnosis not present

## 2016-06-29 DIAGNOSIS — N400 Enlarged prostate without lower urinary tract symptoms: Secondary | ICD-10-CM | POA: Diagnosis not present

## 2016-06-29 DIAGNOSIS — J449 Chronic obstructive pulmonary disease, unspecified: Secondary | ICD-10-CM | POA: Diagnosis not present

## 2016-07-10 DIAGNOSIS — L299 Pruritus, unspecified: Secondary | ICD-10-CM | POA: Diagnosis not present

## 2016-07-10 DIAGNOSIS — I509 Heart failure, unspecified: Secondary | ICD-10-CM | POA: Diagnosis not present

## 2016-07-10 DIAGNOSIS — I10 Essential (primary) hypertension: Secondary | ICD-10-CM | POA: Diagnosis not present

## 2016-07-10 DIAGNOSIS — J449 Chronic obstructive pulmonary disease, unspecified: Secondary | ICD-10-CM | POA: Diagnosis not present

## 2016-07-13 DIAGNOSIS — J449 Chronic obstructive pulmonary disease, unspecified: Secondary | ICD-10-CM | POA: Diagnosis not present

## 2016-07-14 DIAGNOSIS — I10 Essential (primary) hypertension: Secondary | ICD-10-CM | POA: Diagnosis not present

## 2016-08-01 DIAGNOSIS — R451 Restlessness and agitation: Secondary | ICD-10-CM | POA: Diagnosis not present

## 2016-08-01 DIAGNOSIS — I10 Essential (primary) hypertension: Secondary | ICD-10-CM | POA: Diagnosis not present

## 2016-08-01 DIAGNOSIS — J449 Chronic obstructive pulmonary disease, unspecified: Secondary | ICD-10-CM | POA: Diagnosis not present

## 2016-08-01 DIAGNOSIS — F0281 Dementia in other diseases classified elsewhere with behavioral disturbance: Secondary | ICD-10-CM | POA: Diagnosis not present

## 2016-08-07 DIAGNOSIS — J449 Chronic obstructive pulmonary disease, unspecified: Secondary | ICD-10-CM | POA: Diagnosis not present

## 2016-08-07 DIAGNOSIS — N400 Enlarged prostate without lower urinary tract symptoms: Secondary | ICD-10-CM | POA: Diagnosis not present

## 2016-08-07 DIAGNOSIS — I129 Hypertensive chronic kidney disease with stage 1 through stage 4 chronic kidney disease, or unspecified chronic kidney disease: Secondary | ICD-10-CM | POA: Diagnosis not present

## 2016-08-29 DIAGNOSIS — I509 Heart failure, unspecified: Secondary | ICD-10-CM | POA: Diagnosis not present

## 2016-08-29 DIAGNOSIS — I129 Hypertensive chronic kidney disease with stage 1 through stage 4 chronic kidney disease, or unspecified chronic kidney disease: Secondary | ICD-10-CM | POA: Diagnosis not present

## 2016-08-29 DIAGNOSIS — J449 Chronic obstructive pulmonary disease, unspecified: Secondary | ICD-10-CM | POA: Diagnosis not present

## 2016-08-29 DIAGNOSIS — I10 Essential (primary) hypertension: Secondary | ICD-10-CM | POA: Diagnosis not present

## 2016-09-08 DIAGNOSIS — I129 Hypertensive chronic kidney disease with stage 1 through stage 4 chronic kidney disease, or unspecified chronic kidney disease: Secondary | ICD-10-CM | POA: Diagnosis not present

## 2016-09-08 DIAGNOSIS — I10 Essential (primary) hypertension: Secondary | ICD-10-CM | POA: Diagnosis not present

## 2016-09-08 DIAGNOSIS — I509 Heart failure, unspecified: Secondary | ICD-10-CM | POA: Diagnosis not present

## 2016-09-25 DIAGNOSIS — I509 Heart failure, unspecified: Secondary | ICD-10-CM | POA: Diagnosis not present

## 2016-09-25 DIAGNOSIS — M6281 Muscle weakness (generalized): Secondary | ICD-10-CM | POA: Diagnosis not present

## 2016-09-25 DIAGNOSIS — I129 Hypertensive chronic kidney disease with stage 1 through stage 4 chronic kidney disease, or unspecified chronic kidney disease: Secondary | ICD-10-CM | POA: Diagnosis not present

## 2016-09-25 DIAGNOSIS — I10 Essential (primary) hypertension: Secondary | ICD-10-CM | POA: Diagnosis not present

## 2016-10-04 DIAGNOSIS — I129 Hypertensive chronic kidney disease with stage 1 through stage 4 chronic kidney disease, or unspecified chronic kidney disease: Secondary | ICD-10-CM | POA: Diagnosis not present

## 2016-10-04 DIAGNOSIS — J449 Chronic obstructive pulmonary disease, unspecified: Secondary | ICD-10-CM | POA: Diagnosis not present

## 2016-10-04 DIAGNOSIS — F0281 Dementia in other diseases classified elsewhere with behavioral disturbance: Secondary | ICD-10-CM | POA: Diagnosis not present

## 2016-10-04 DIAGNOSIS — M6281 Muscle weakness (generalized): Secondary | ICD-10-CM | POA: Diagnosis not present

## 2016-10-09 DIAGNOSIS — Z23 Encounter for immunization: Secondary | ICD-10-CM | POA: Diagnosis not present

## 2016-10-17 DIAGNOSIS — J449 Chronic obstructive pulmonary disease, unspecified: Secondary | ICD-10-CM | POA: Diagnosis not present

## 2016-10-17 DIAGNOSIS — M6281 Muscle weakness (generalized): Secondary | ICD-10-CM | POA: Diagnosis not present

## 2016-10-17 DIAGNOSIS — I129 Hypertensive chronic kidney disease with stage 1 through stage 4 chronic kidney disease, or unspecified chronic kidney disease: Secondary | ICD-10-CM | POA: Diagnosis not present

## 2016-10-17 DIAGNOSIS — I10 Essential (primary) hypertension: Secondary | ICD-10-CM | POA: Diagnosis not present

## 2016-11-01 DIAGNOSIS — J449 Chronic obstructive pulmonary disease, unspecified: Secondary | ICD-10-CM | POA: Diagnosis not present

## 2016-11-01 DIAGNOSIS — M6281 Muscle weakness (generalized): Secondary | ICD-10-CM | POA: Diagnosis not present

## 2016-11-01 DIAGNOSIS — I129 Hypertensive chronic kidney disease with stage 1 through stage 4 chronic kidney disease, or unspecified chronic kidney disease: Secondary | ICD-10-CM | POA: Diagnosis not present

## 2016-11-06 DIAGNOSIS — Z79899 Other long term (current) drug therapy: Secondary | ICD-10-CM | POA: Diagnosis not present

## 2016-11-06 DIAGNOSIS — E785 Hyperlipidemia, unspecified: Secondary | ICD-10-CM | POA: Diagnosis not present

## 2016-11-06 DIAGNOSIS — D649 Anemia, unspecified: Secondary | ICD-10-CM | POA: Diagnosis not present

## 2016-11-06 DIAGNOSIS — E7849 Other hyperlipidemia: Secondary | ICD-10-CM | POA: Diagnosis not present

## 2016-11-06 DIAGNOSIS — I10 Essential (primary) hypertension: Secondary | ICD-10-CM | POA: Diagnosis not present

## 2016-11-15 DIAGNOSIS — D649 Anemia, unspecified: Secondary | ICD-10-CM | POA: Diagnosis not present

## 2016-11-15 DIAGNOSIS — I10 Essential (primary) hypertension: Secondary | ICD-10-CM | POA: Diagnosis not present

## 2016-11-16 DIAGNOSIS — M6281 Muscle weakness (generalized): Secondary | ICD-10-CM | POA: Diagnosis not present

## 2016-11-16 DIAGNOSIS — I509 Heart failure, unspecified: Secondary | ICD-10-CM | POA: Diagnosis not present

## 2016-11-16 DIAGNOSIS — I129 Hypertensive chronic kidney disease with stage 1 through stage 4 chronic kidney disease, or unspecified chronic kidney disease: Secondary | ICD-10-CM | POA: Diagnosis not present

## 2016-11-16 DIAGNOSIS — I10 Essential (primary) hypertension: Secondary | ICD-10-CM | POA: Diagnosis not present

## 2016-11-20 DIAGNOSIS — D649 Anemia, unspecified: Secondary | ICD-10-CM | POA: Diagnosis not present

## 2016-11-22 DIAGNOSIS — D649 Anemia, unspecified: Secondary | ICD-10-CM | POA: Diagnosis not present

## 2016-12-11 DIAGNOSIS — J449 Chronic obstructive pulmonary disease, unspecified: Secondary | ICD-10-CM | POA: Diagnosis not present

## 2016-12-11 DIAGNOSIS — I129 Hypertensive chronic kidney disease with stage 1 through stage 4 chronic kidney disease, or unspecified chronic kidney disease: Secondary | ICD-10-CM | POA: Diagnosis not present

## 2016-12-11 DIAGNOSIS — R5381 Other malaise: Secondary | ICD-10-CM | POA: Diagnosis not present

## 2016-12-11 DIAGNOSIS — M6281 Muscle weakness (generalized): Secondary | ICD-10-CM | POA: Diagnosis not present

## 2016-12-12 DIAGNOSIS — Z86010 Personal history of colonic polyps: Secondary | ICD-10-CM | POA: Diagnosis not present

## 2016-12-12 DIAGNOSIS — D649 Anemia, unspecified: Secondary | ICD-10-CM | POA: Diagnosis not present

## 2016-12-29 DIAGNOSIS — I509 Heart failure, unspecified: Secondary | ICD-10-CM | POA: Diagnosis not present

## 2016-12-29 DIAGNOSIS — I129 Hypertensive chronic kidney disease with stage 1 through stage 4 chronic kidney disease, or unspecified chronic kidney disease: Secondary | ICD-10-CM | POA: Diagnosis not present

## 2016-12-29 DIAGNOSIS — M6281 Muscle weakness (generalized): Secondary | ICD-10-CM | POA: Diagnosis not present

## 2016-12-29 DIAGNOSIS — I10 Essential (primary) hypertension: Secondary | ICD-10-CM | POA: Diagnosis not present

## 2017-01-02 DIAGNOSIS — R079 Chest pain, unspecified: Secondary | ICD-10-CM | POA: Diagnosis not present

## 2017-01-02 DIAGNOSIS — D649 Anemia, unspecified: Secondary | ICD-10-CM | POA: Diagnosis not present

## 2017-01-02 DIAGNOSIS — I10 Essential (primary) hypertension: Secondary | ICD-10-CM | POA: Diagnosis not present

## 2017-01-10 DIAGNOSIS — D649 Anemia, unspecified: Secondary | ICD-10-CM | POA: Diagnosis not present

## 2017-01-17 DIAGNOSIS — D649 Anemia, unspecified: Secondary | ICD-10-CM | POA: Diagnosis not present

## 2017-01-17 DIAGNOSIS — I10 Essential (primary) hypertension: Secondary | ICD-10-CM | POA: Diagnosis not present

## 2017-01-24 DIAGNOSIS — I10 Essential (primary) hypertension: Secondary | ICD-10-CM | POA: Diagnosis not present

## 2017-01-24 DIAGNOSIS — Z7901 Long term (current) use of anticoagulants: Secondary | ICD-10-CM | POA: Diagnosis not present

## 2017-01-24 DIAGNOSIS — D649 Anemia, unspecified: Secondary | ICD-10-CM | POA: Diagnosis not present

## 2017-01-25 DIAGNOSIS — I639 Cerebral infarction, unspecified: Secondary | ICD-10-CM | POA: Diagnosis not present

## 2017-01-25 DIAGNOSIS — I129 Hypertensive chronic kidney disease with stage 1 through stage 4 chronic kidney disease, or unspecified chronic kidney disease: Secondary | ICD-10-CM | POA: Diagnosis not present

## 2017-01-25 DIAGNOSIS — I10 Essential (primary) hypertension: Secondary | ICD-10-CM | POA: Diagnosis not present

## 2017-01-25 DIAGNOSIS — I509 Heart failure, unspecified: Secondary | ICD-10-CM | POA: Diagnosis not present

## 2017-01-26 DIAGNOSIS — I129 Hypertensive chronic kidney disease with stage 1 through stage 4 chronic kidney disease, or unspecified chronic kidney disease: Secondary | ICD-10-CM | POA: Diagnosis not present

## 2017-01-26 DIAGNOSIS — J449 Chronic obstructive pulmonary disease, unspecified: Secondary | ICD-10-CM | POA: Diagnosis not present

## 2017-01-26 DIAGNOSIS — N400 Enlarged prostate without lower urinary tract symptoms: Secondary | ICD-10-CM | POA: Diagnosis not present

## 2017-01-26 DIAGNOSIS — J9611 Chronic respiratory failure with hypoxia: Secondary | ICD-10-CM | POA: Diagnosis not present

## 2017-02-16 IMAGING — CR DG CHEST 2V
3 series · 3 of 3 positions shown · non-contrast
Comparison: Chest radiograph performed 04/04/2014

CLINICAL DATA: Acute onset of shortness of breath. Initial
encounter.

EXAM:
CHEST  2 VIEW

[w chest lat]
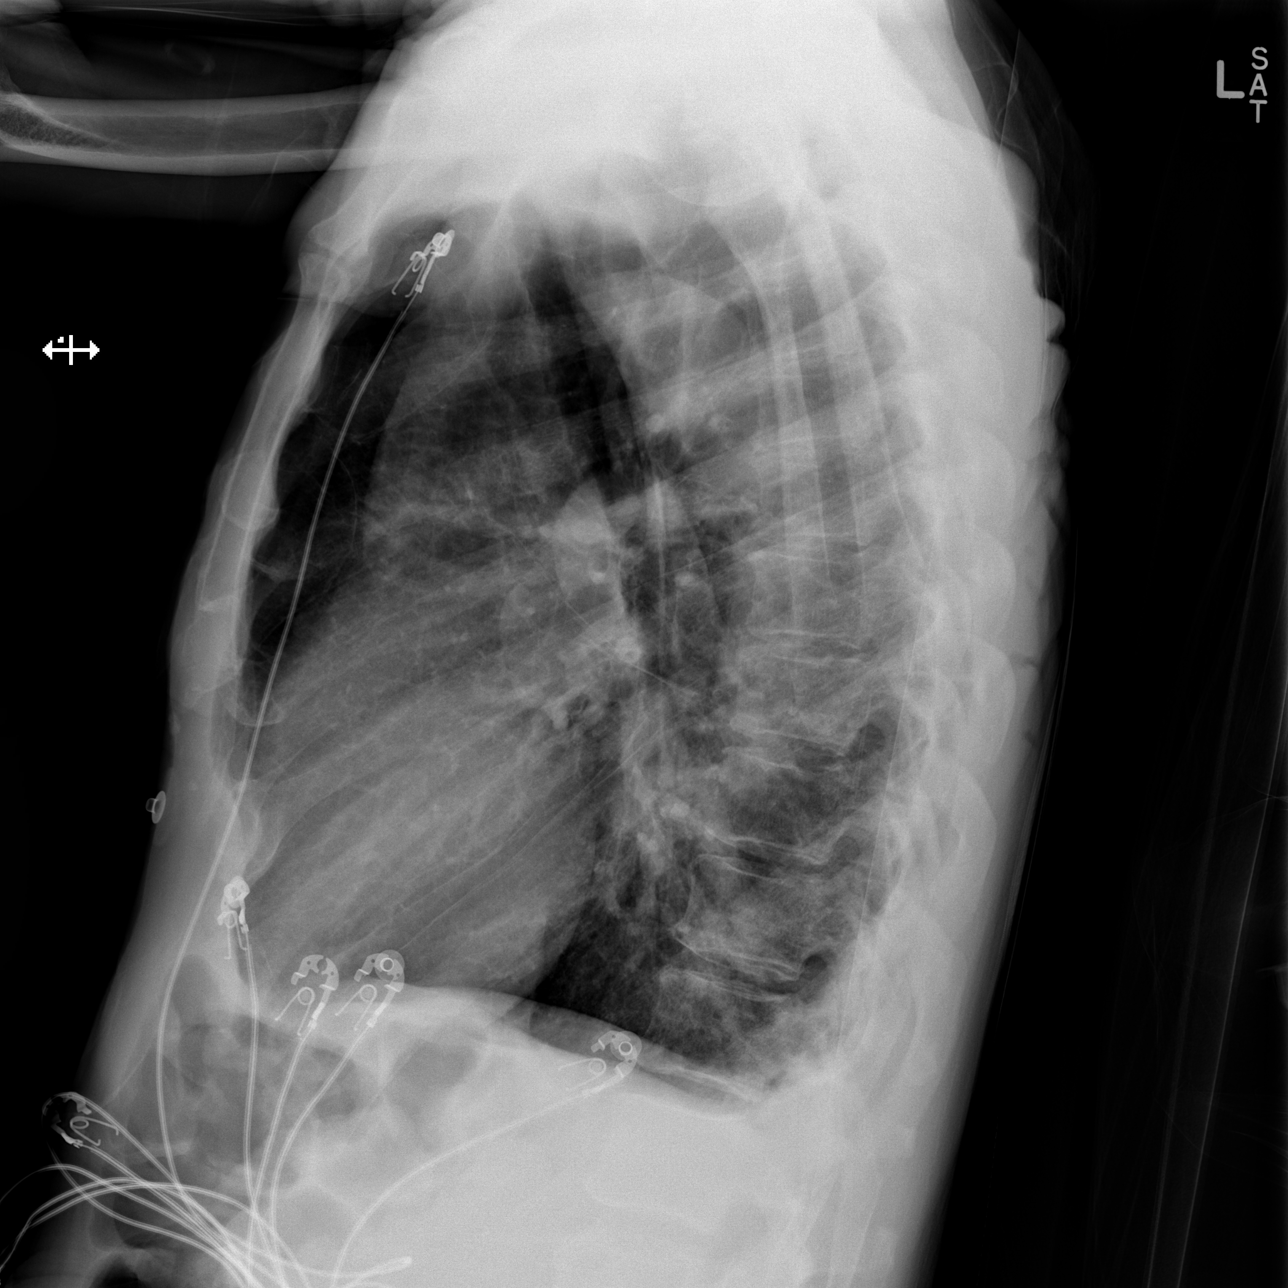

[x chest ap (1 of 2)]
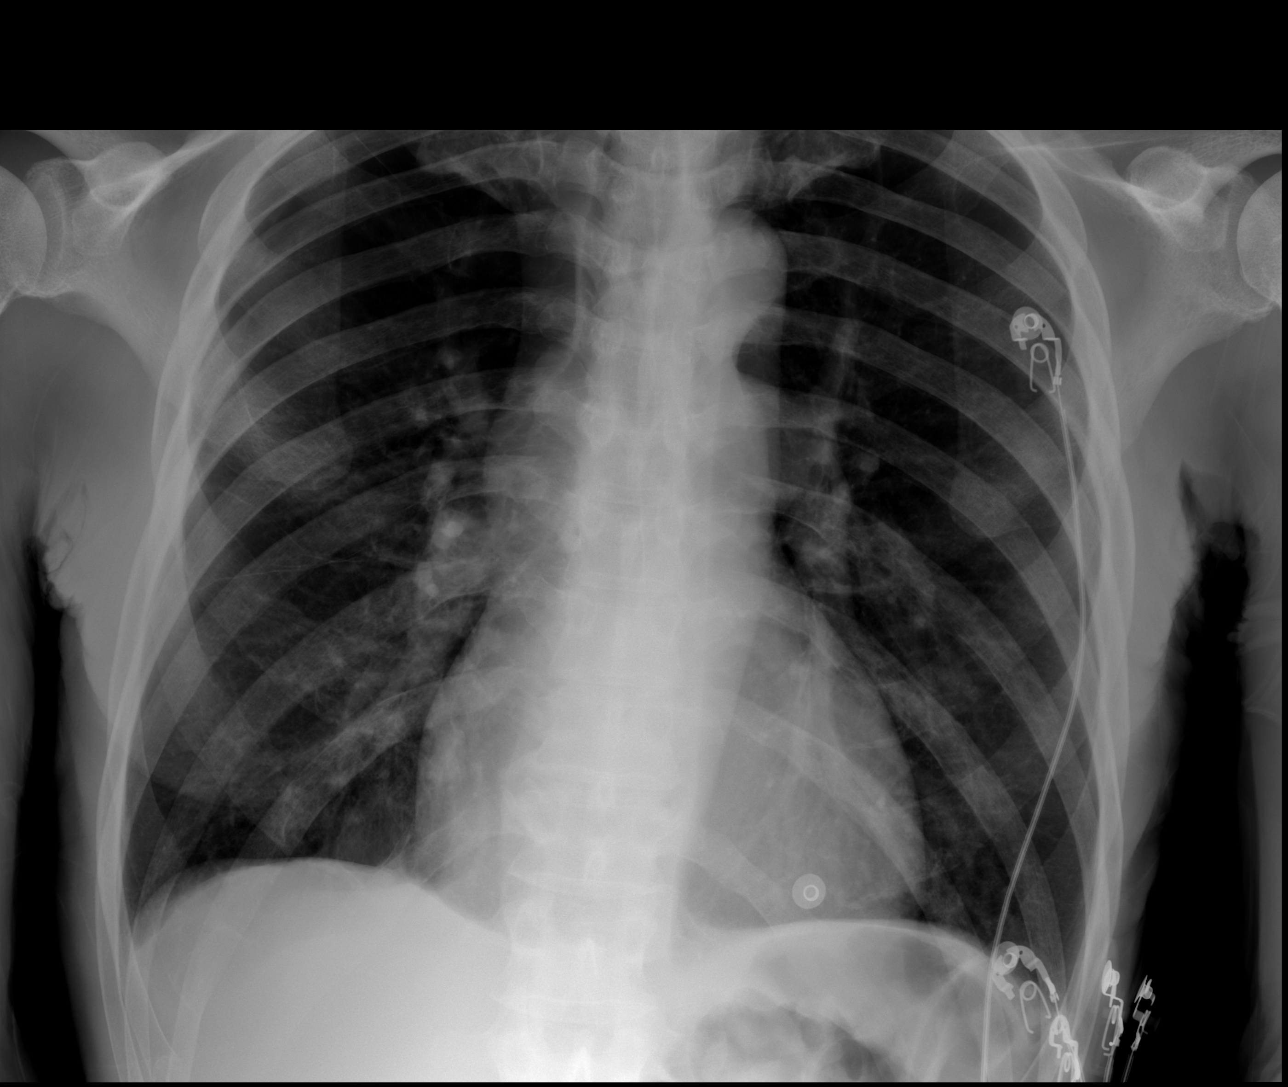

[x chest ap (2 of 2)]
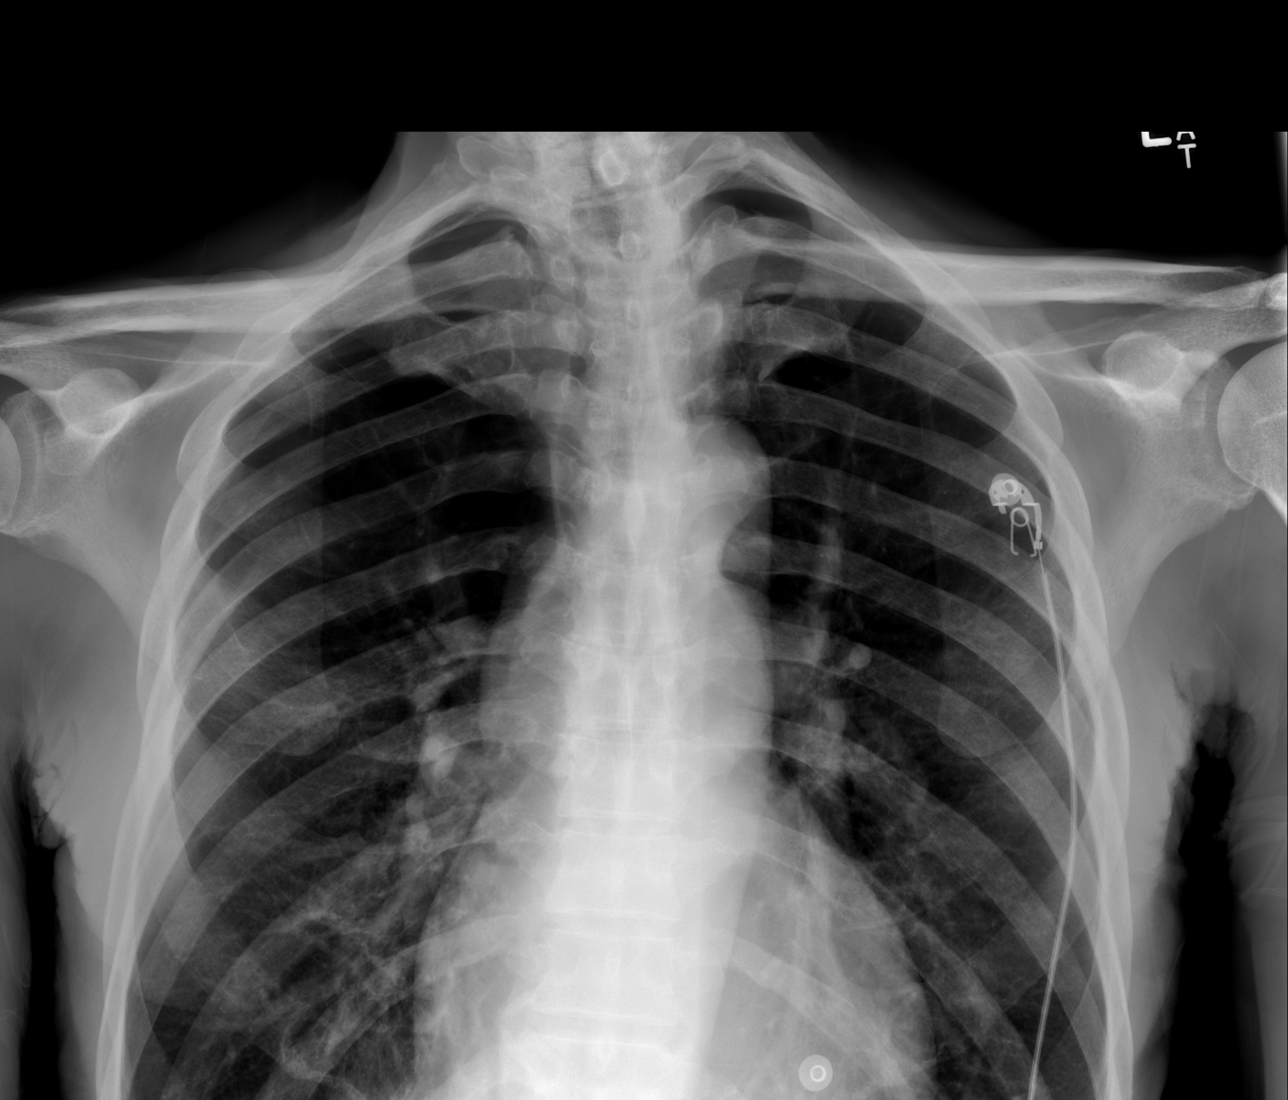

[3 of 3 positions shown; findings below may reference images not displayed]

FINDINGS: The lungs are well-aerated. Small bilateral pleural effusions are
seen. Minimal right midlung opacity may reflect atelectasis or
possibly mild pneumonia. No pneumothorax is seen.

The heart is normal in size; the mediastinal contour is within
normal limits. No acute osseous abnormalities are seen.
IMPRESSION: Small bilateral pleural effusions noted; minimal right mid lung
opacity may reflect atelectasis or possibly mild pneumonia.

## 2017-02-27 DIAGNOSIS — I129 Hypertensive chronic kidney disease with stage 1 through stage 4 chronic kidney disease, or unspecified chronic kidney disease: Secondary | ICD-10-CM | POA: Diagnosis not present

## 2017-02-27 DIAGNOSIS — I509 Heart failure, unspecified: Secondary | ICD-10-CM | POA: Diagnosis not present

## 2017-02-27 DIAGNOSIS — J449 Chronic obstructive pulmonary disease, unspecified: Secondary | ICD-10-CM | POA: Diagnosis not present

## 2017-02-27 DIAGNOSIS — I10 Essential (primary) hypertension: Secondary | ICD-10-CM | POA: Diagnosis not present

## 2017-02-28 IMAGING — CT CT HEAD W/O CM
2 series · 16 of 30 positions shown, 20 images · non-contrast
Comparison: MRI 03/14/2014

CLINICAL DATA: Weakness for 1 day

EXAM:
CT HEAD WITHOUT CONTRAST
TECHNIQUE: Contiguous axial images were obtained from the base of the skull
through the vertex without intravenous contrast.

[Series 2: head w/o · axial · non-contrast · 0.45mm/px · z∈[-223,-93]mm · 13 of 32 slices shown, 17 images]
[im 3/32  brain]
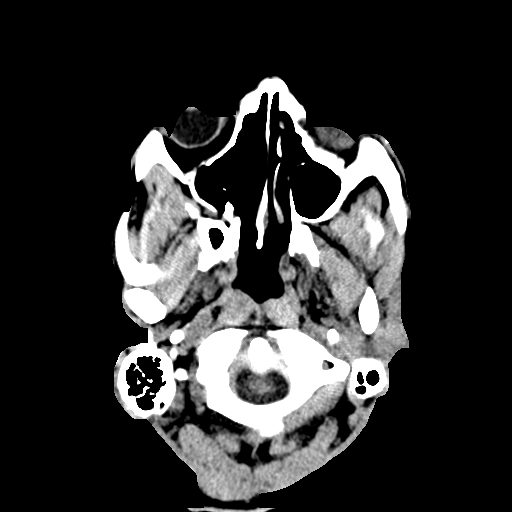
[im 3/32  bone]
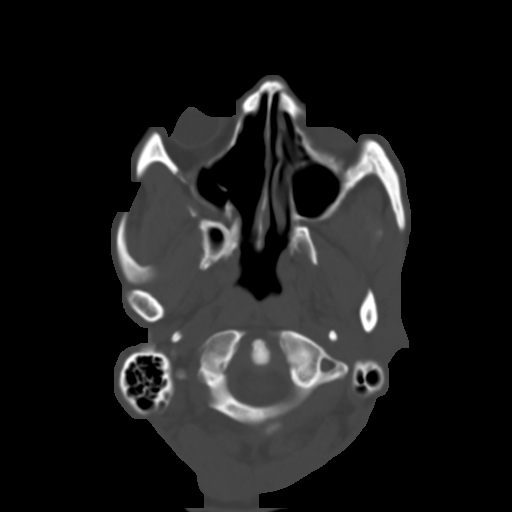
[im 5/32  brain]
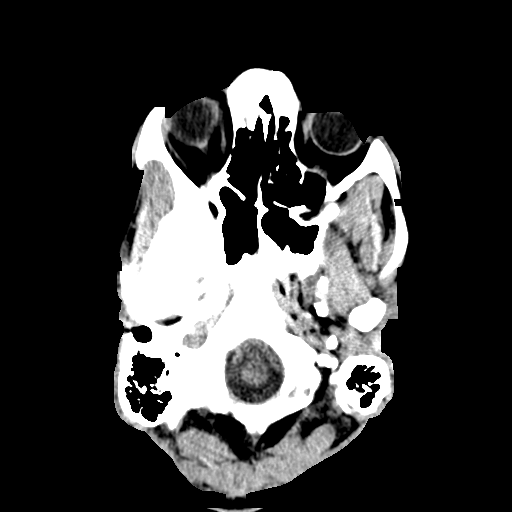
[im 7/32  brain]
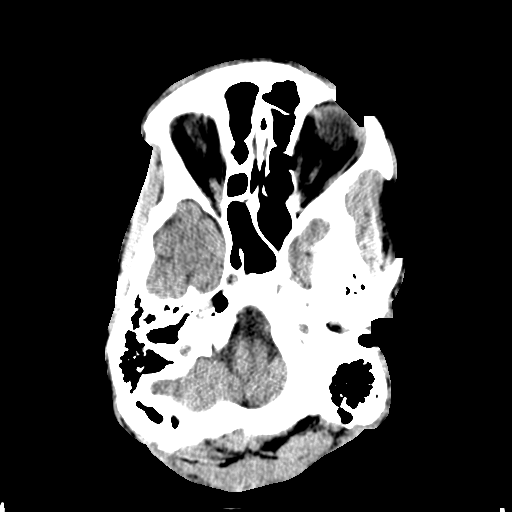
[im 9/32  brain]
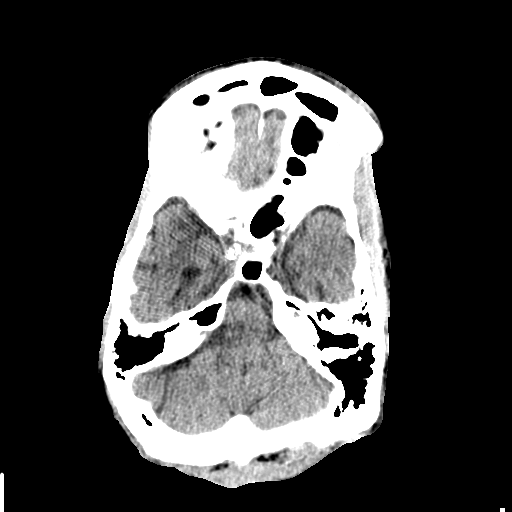
[im 12/32  brain]
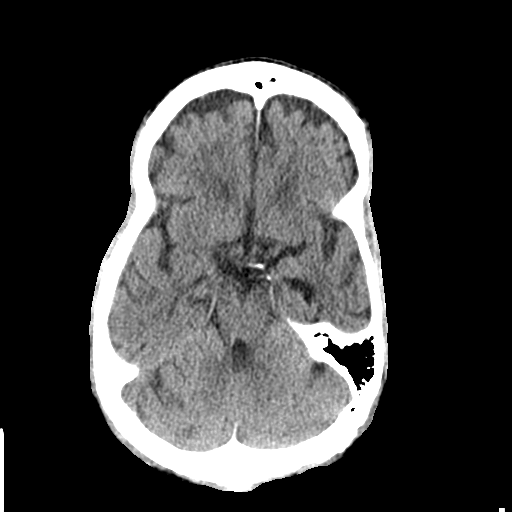
[im 12/32  bone]
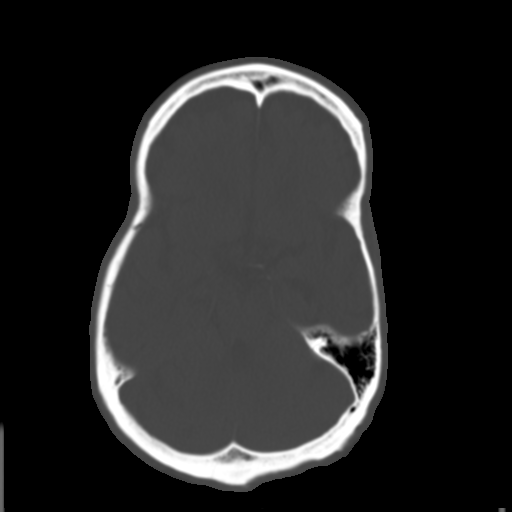
[im 14/32  brain]
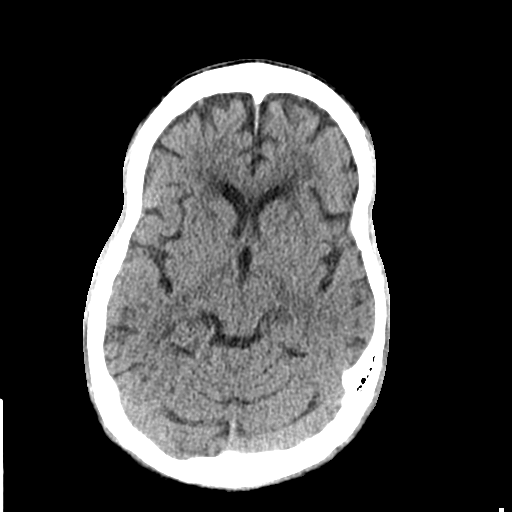
[im 16/32  brain]
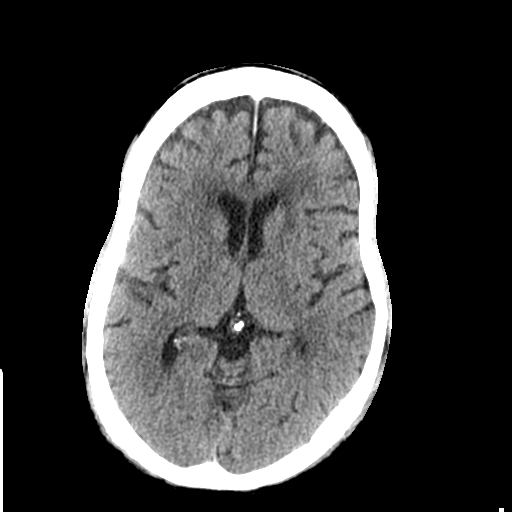
[im 18/32  brain]
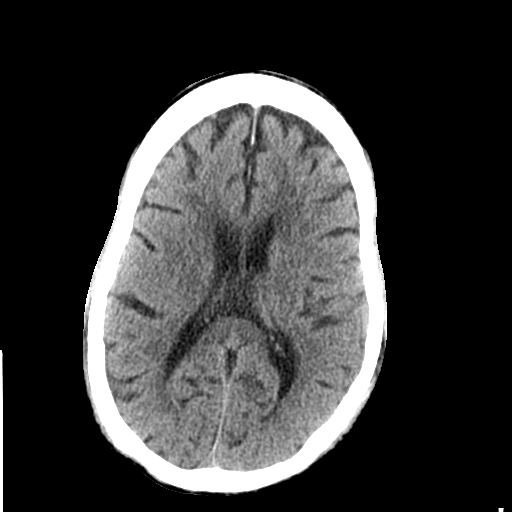
[im 20/32  brain]
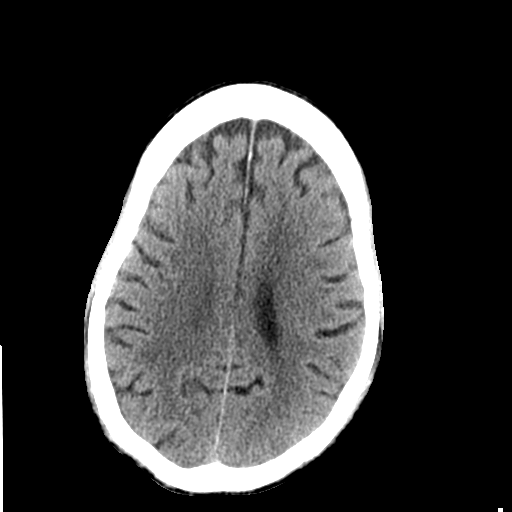
[im 20/32  bone]
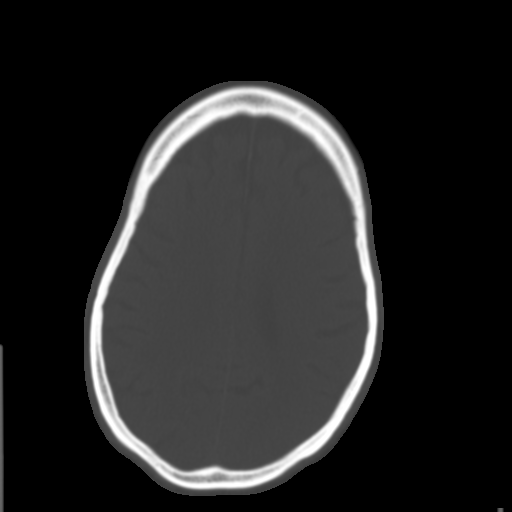
[im 23/32  brain]
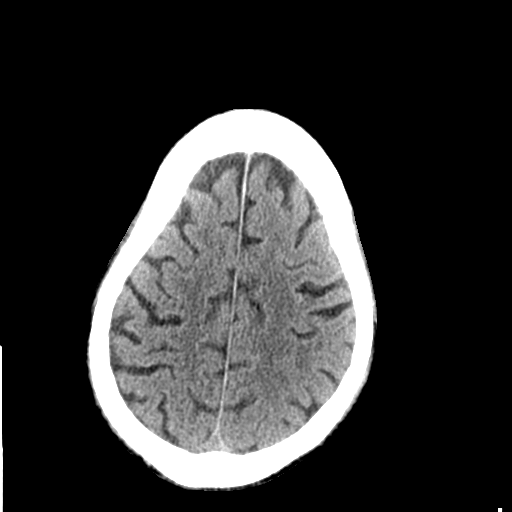
[im 25/32  brain]
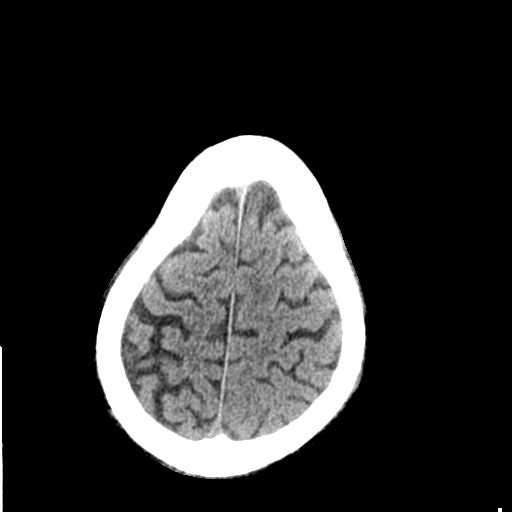
[im 27/32  brain]
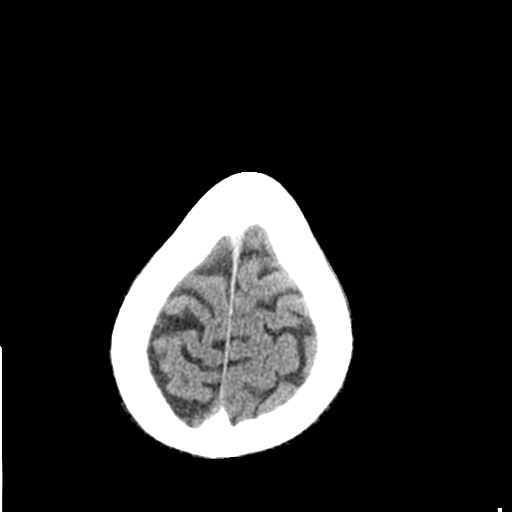
[im 29/32  brain]
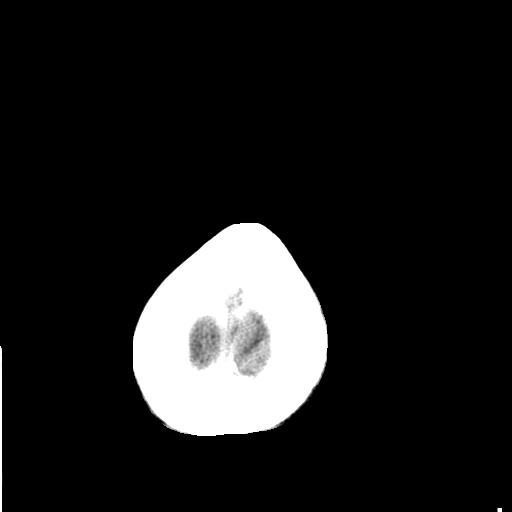
[im 29/32  bone]
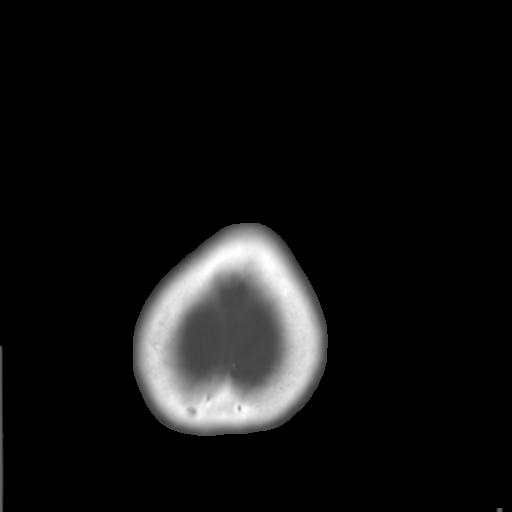

[Series 3: bone windows · axial · 0.45mm/px · z∈[-223,-178]mm · 3 of 32 slices shown]
[im 3/32  bone]
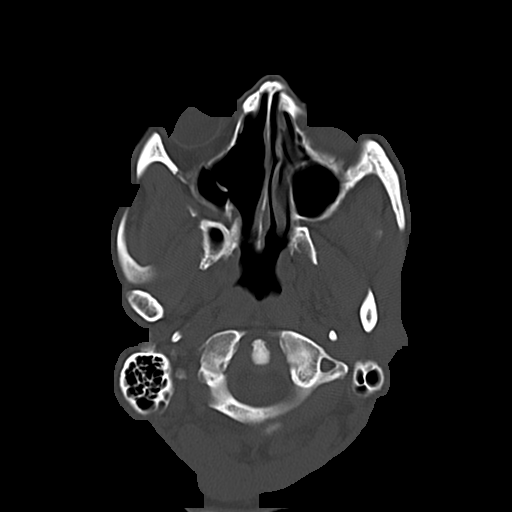
[im 7/32  bone]
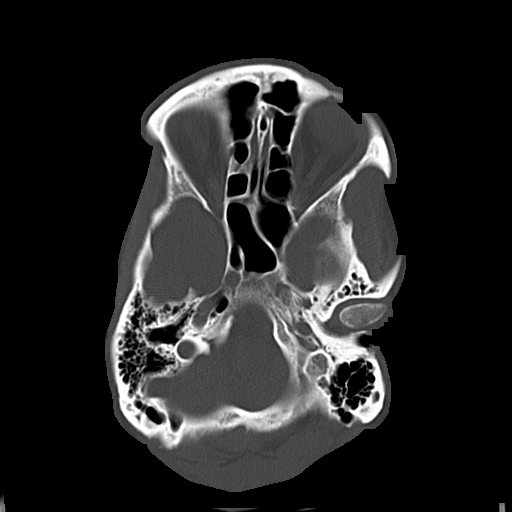
[im 12/32  bone]
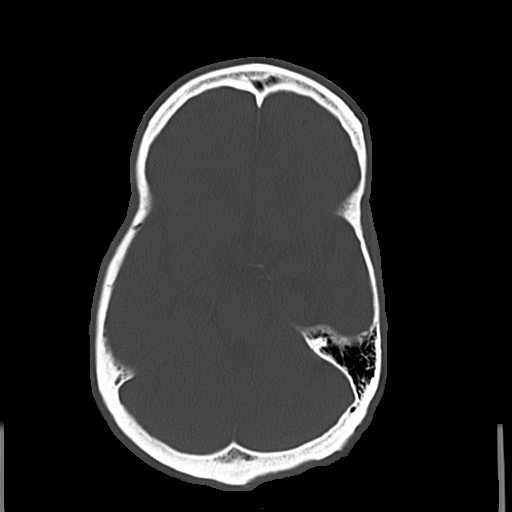

[16 of 30 positions shown; findings below may reference images not displayed]

FINDINGS: There is no intracranial hemorrhage, mass or evidence of acute
infarction. There is white matter hypodensity in both cerebral
hemispheres, likely due to chronic small vessel ischemic disease.
There is mild generalized atrophy. There is no bony abnormality.
There is prior sinonasal surgery on the right with the visible
paranasal sinuses being clear.
IMPRESSION: No acute findings. Generalized atrophy and moderate chronic small
vessel ischemic disease.

## 2017-03-02 DIAGNOSIS — N183 Chronic kidney disease, stage 3 (moderate): Secondary | ICD-10-CM | POA: Diagnosis not present

## 2017-03-02 DIAGNOSIS — I509 Heart failure, unspecified: Secondary | ICD-10-CM | POA: Diagnosis not present

## 2017-03-02 DIAGNOSIS — J449 Chronic obstructive pulmonary disease, unspecified: Secondary | ICD-10-CM | POA: Diagnosis not present

## 2017-03-02 DIAGNOSIS — I130 Hypertensive heart and chronic kidney disease with heart failure and stage 1 through stage 4 chronic kidney disease, or unspecified chronic kidney disease: Secondary | ICD-10-CM | POA: Diagnosis not present

## 2017-03-24 DIAGNOSIS — J449 Chronic obstructive pulmonary disease, unspecified: Secondary | ICD-10-CM | POA: Diagnosis not present

## 2017-03-24 DIAGNOSIS — I129 Hypertensive chronic kidney disease with stage 1 through stage 4 chronic kidney disease, or unspecified chronic kidney disease: Secondary | ICD-10-CM | POA: Diagnosis not present

## 2017-03-24 DIAGNOSIS — F0280 Dementia in other diseases classified elsewhere without behavioral disturbance: Secondary | ICD-10-CM | POA: Diagnosis not present

## 2017-03-24 DIAGNOSIS — I10 Essential (primary) hypertension: Secondary | ICD-10-CM | POA: Diagnosis not present

## 2017-03-26 DIAGNOSIS — E119 Type 2 diabetes mellitus without complications: Secondary | ICD-10-CM | POA: Diagnosis not present

## 2017-03-26 DIAGNOSIS — Z79899 Other long term (current) drug therapy: Secondary | ICD-10-CM | POA: Diagnosis not present

## 2017-03-29 IMAGING — US US RENAL
1 series · 14 of 25 positions shown · non-contrast
Comparison: renal ultrasound March 14, 2014

CLINICAL DATA: Acute renal insufficiency

EXAM:
RENAL / URINARY TRACT ULTRASOUND COMPLETE

[Series 1: us renal · 0.17mm/px · 14 of 45 slices shown]
[im 1/45]
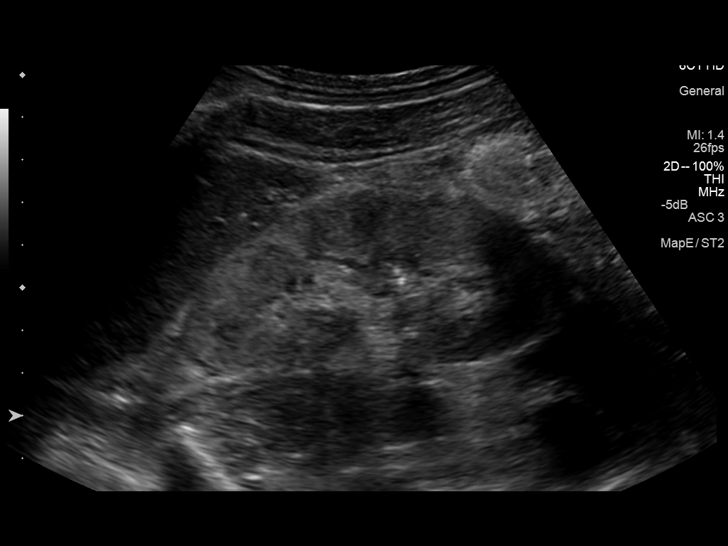
[im 4/45]
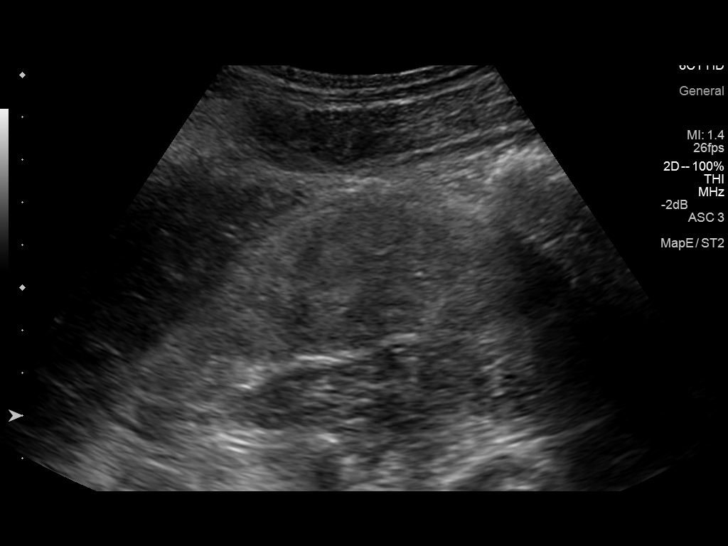
[im 8/45]
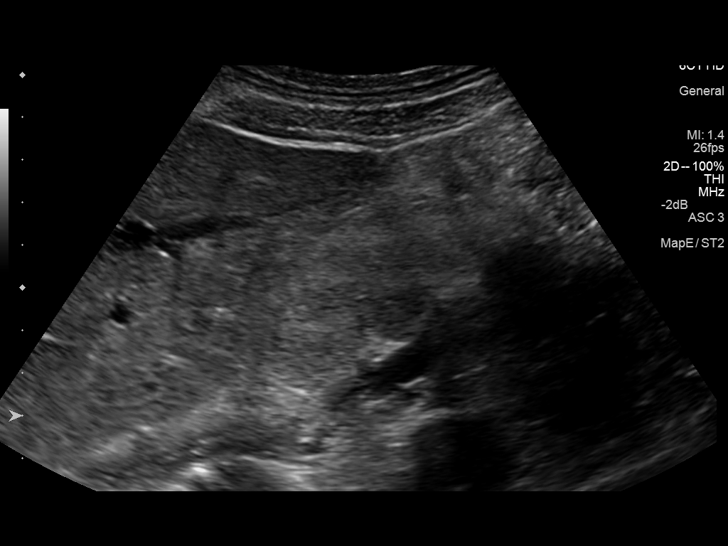
[im 12/45]
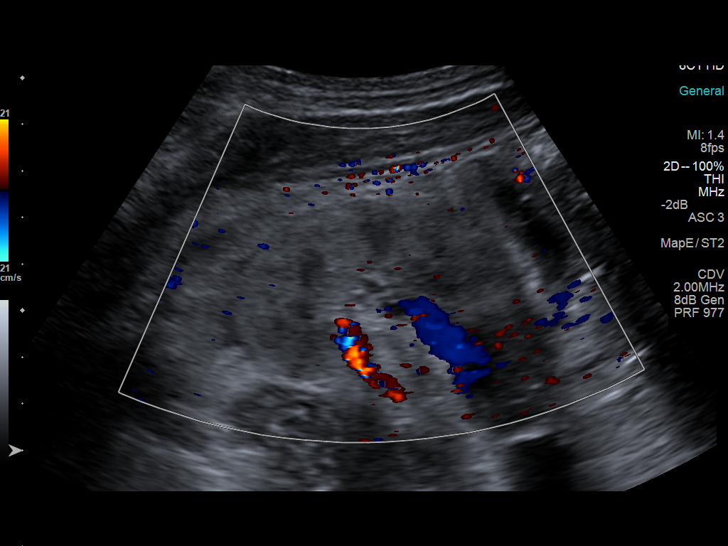
[im 15/45]
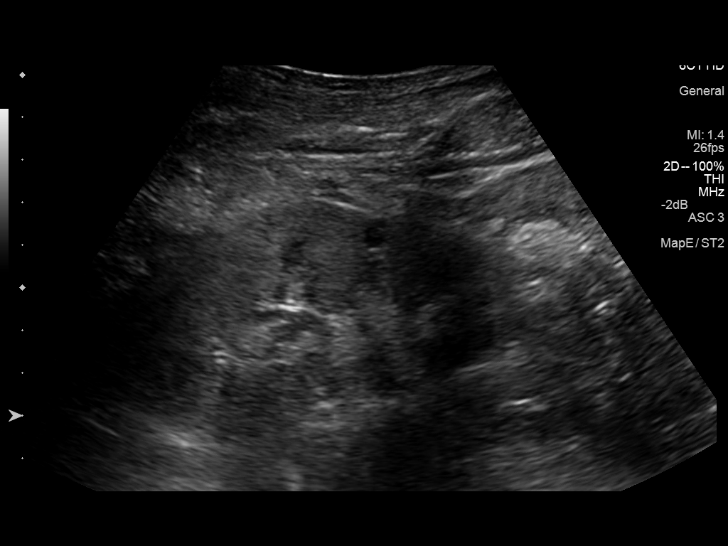
[im 17/45]
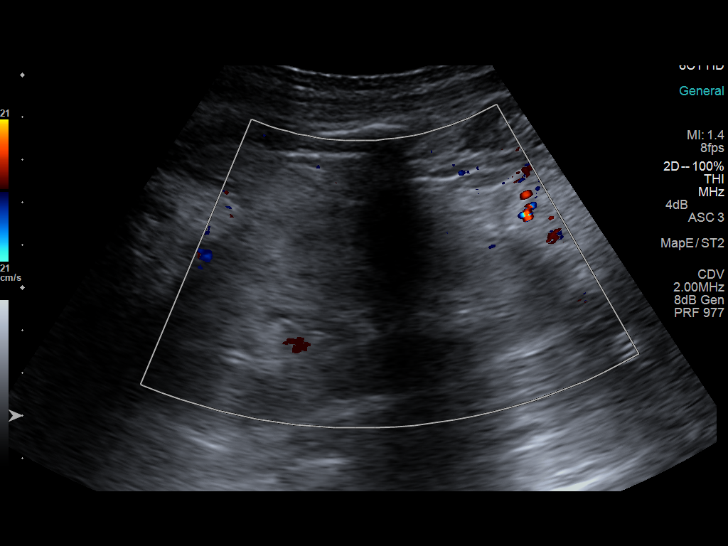
[im 21/45]
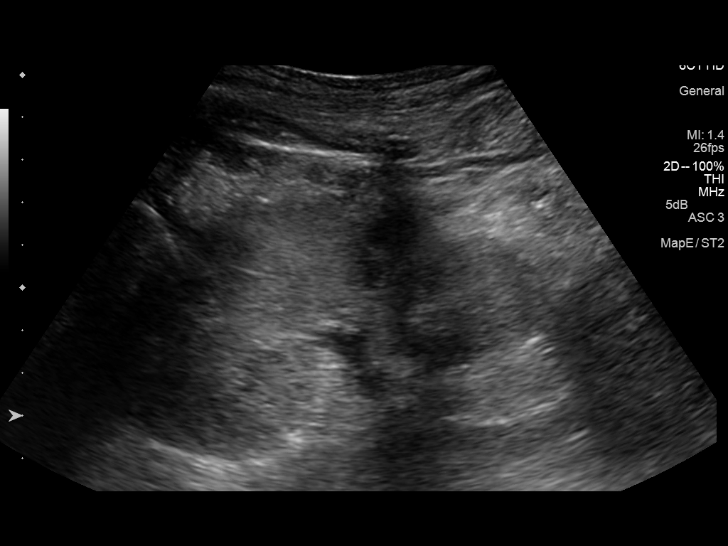
[im 24/45]
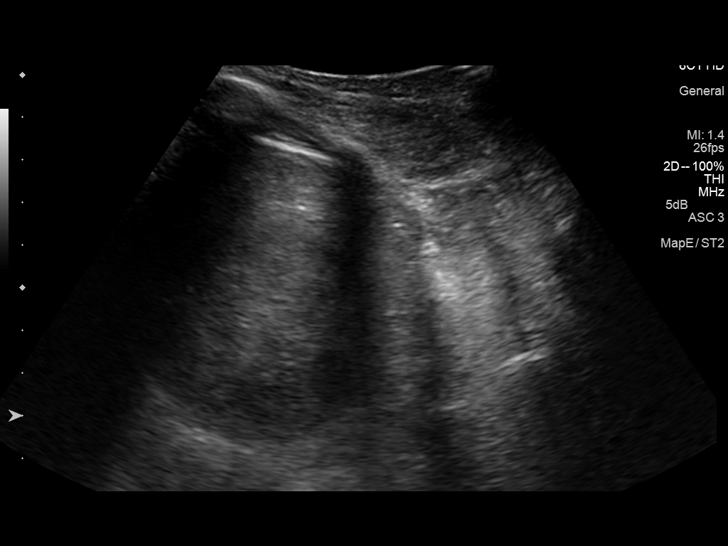
[im 28/45]
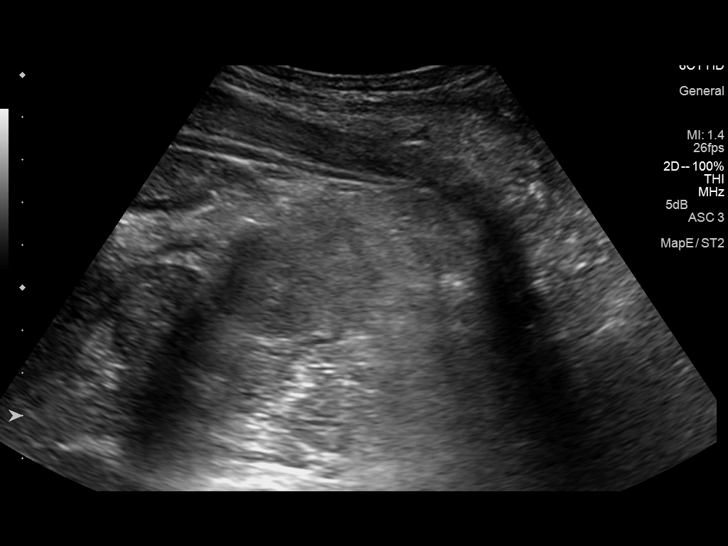
[im 30/45]
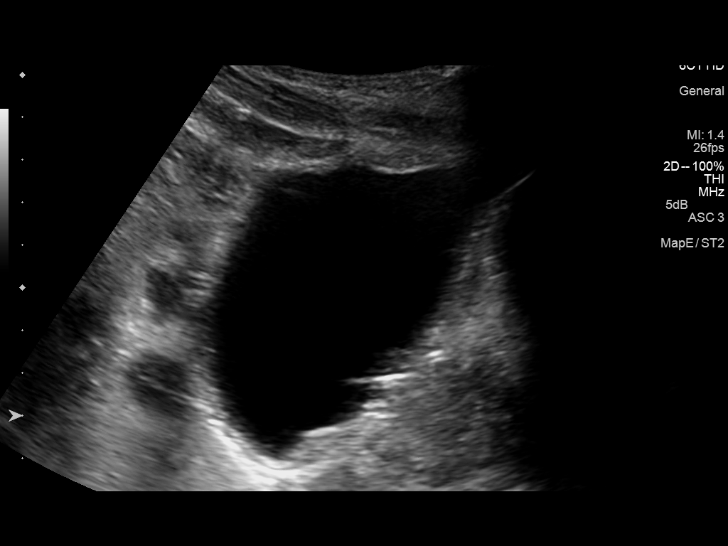
[im 34/45]
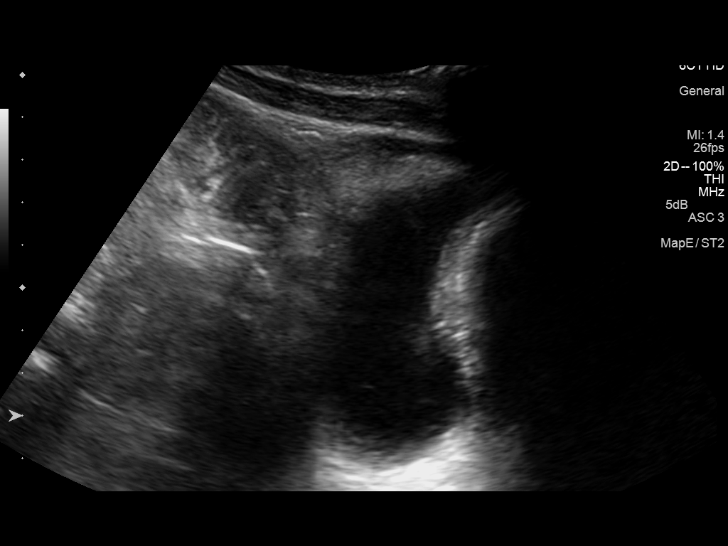
[im 37/45]
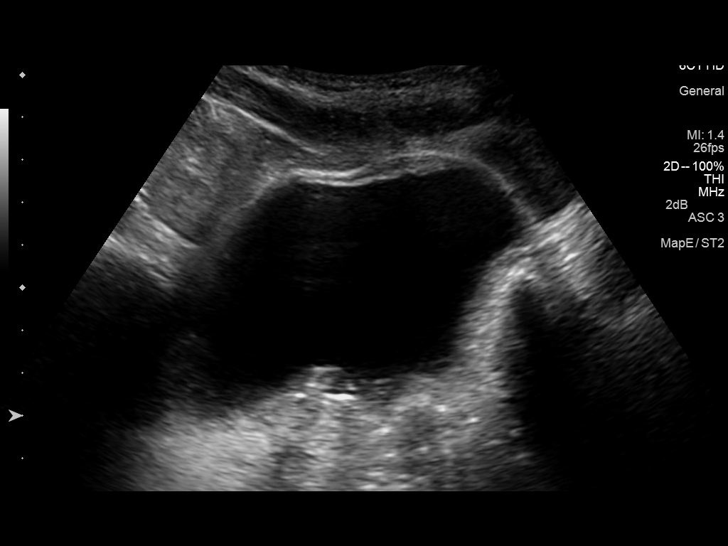
[im 41/45]
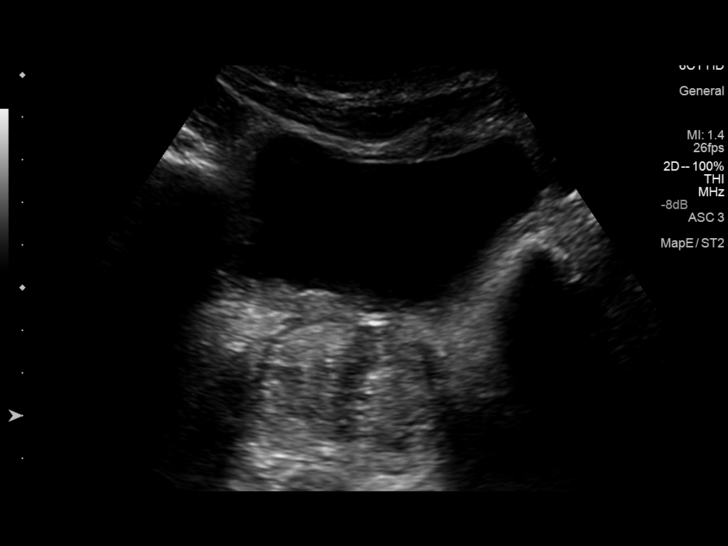
[im 45/45]
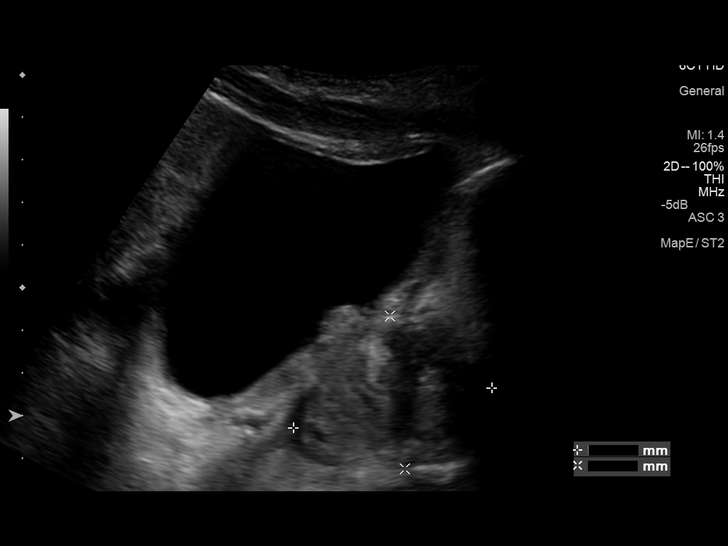

[14 of 25 positions shown; findings below may reference images not displayed]

FINDINGS: Right Kidney:

Length: 8.7 cm. The renal cortical echotexture is increased and
greater than that of the adjacent liver. There is no hydronephrosis.
There is no focal mass.

Left Kidney:

Length: 10.0 cm. The renal cortical echotexture remains increased.
There is no hydronephrosis nor focal mass.

Bladder:

The urinary bladder is moderately distended. Bilateral ureteral jets
are visible. The prostate gland is enlarged measuring 4 point 8 x
3.6 x 4.9 cm.
IMPRESSION: 1. Increased renal cortical echotexture persists consistent with
medical renal disease. There is no parenchymal mass.
2. There is no hydronephrosis. The urinary bladder remains mildly
distended but bilateral ureteral jets are visible bilaterally.
3. There is mild enlargement of the prostate gland.

## 2017-03-30 DIAGNOSIS — I509 Heart failure, unspecified: Secondary | ICD-10-CM | POA: Diagnosis not present

## 2017-03-30 DIAGNOSIS — I130 Hypertensive heart and chronic kidney disease with heart failure and stage 1 through stage 4 chronic kidney disease, or unspecified chronic kidney disease: Secondary | ICD-10-CM | POA: Diagnosis not present

## 2017-03-30 DIAGNOSIS — J449 Chronic obstructive pulmonary disease, unspecified: Secondary | ICD-10-CM | POA: Diagnosis not present

## 2017-03-30 DIAGNOSIS — N183 Chronic kidney disease, stage 3 (moderate): Secondary | ICD-10-CM | POA: Diagnosis not present

## 2017-04-12 DIAGNOSIS — I509 Heart failure, unspecified: Secondary | ICD-10-CM | POA: Diagnosis not present

## 2017-04-12 DIAGNOSIS — M6281 Muscle weakness (generalized): Secondary | ICD-10-CM | POA: Diagnosis not present

## 2017-04-12 DIAGNOSIS — I10 Essential (primary) hypertension: Secondary | ICD-10-CM | POA: Diagnosis not present

## 2017-04-12 DIAGNOSIS — I129 Hypertensive chronic kidney disease with stage 1 through stage 4 chronic kidney disease, or unspecified chronic kidney disease: Secondary | ICD-10-CM | POA: Diagnosis not present

## 2017-04-27 DIAGNOSIS — F0390 Unspecified dementia without behavioral disturbance: Secondary | ICD-10-CM | POA: Diagnosis not present

## 2017-04-27 DIAGNOSIS — J449 Chronic obstructive pulmonary disease, unspecified: Secondary | ICD-10-CM | POA: Diagnosis not present

## 2017-04-27 DIAGNOSIS — K219 Gastro-esophageal reflux disease without esophagitis: Secondary | ICD-10-CM | POA: Diagnosis not present

## 2017-05-21 DIAGNOSIS — G4700 Insomnia, unspecified: Secondary | ICD-10-CM | POA: Diagnosis not present

## 2017-05-21 DIAGNOSIS — J449 Chronic obstructive pulmonary disease, unspecified: Secondary | ICD-10-CM | POA: Diagnosis not present

## 2017-05-21 DIAGNOSIS — I129 Hypertensive chronic kidney disease with stage 1 through stage 4 chronic kidney disease, or unspecified chronic kidney disease: Secondary | ICD-10-CM | POA: Diagnosis not present

## 2017-05-21 DIAGNOSIS — F0280 Dementia in other diseases classified elsewhere without behavioral disturbance: Secondary | ICD-10-CM | POA: Diagnosis not present

## 2017-05-25 DIAGNOSIS — J9611 Chronic respiratory failure with hypoxia: Secondary | ICD-10-CM | POA: Diagnosis not present

## 2017-05-25 DIAGNOSIS — I129 Hypertensive chronic kidney disease with stage 1 through stage 4 chronic kidney disease, or unspecified chronic kidney disease: Secondary | ICD-10-CM | POA: Diagnosis not present

## 2017-05-25 DIAGNOSIS — N183 Chronic kidney disease, stage 3 (moderate): Secondary | ICD-10-CM | POA: Diagnosis not present

## 2017-05-25 DIAGNOSIS — J449 Chronic obstructive pulmonary disease, unspecified: Secondary | ICD-10-CM | POA: Diagnosis not present

## 2017-06-18 DIAGNOSIS — I129 Hypertensive chronic kidney disease with stage 1 through stage 4 chronic kidney disease, or unspecified chronic kidney disease: Secondary | ICD-10-CM | POA: Diagnosis not present

## 2017-06-18 DIAGNOSIS — F329 Major depressive disorder, single episode, unspecified: Secondary | ICD-10-CM | POA: Diagnosis not present

## 2017-06-18 DIAGNOSIS — J449 Chronic obstructive pulmonary disease, unspecified: Secondary | ICD-10-CM | POA: Diagnosis not present

## 2017-06-18 DIAGNOSIS — I509 Heart failure, unspecified: Secondary | ICD-10-CM | POA: Diagnosis not present

## 2017-06-20 DIAGNOSIS — K5900 Constipation, unspecified: Secondary | ICD-10-CM | POA: Diagnosis not present

## 2017-06-20 DIAGNOSIS — F0390 Unspecified dementia without behavioral disturbance: Secondary | ICD-10-CM | POA: Diagnosis not present

## 2017-06-20 DIAGNOSIS — N400 Enlarged prostate without lower urinary tract symptoms: Secondary | ICD-10-CM | POA: Diagnosis not present

## 2017-06-20 DIAGNOSIS — E785 Hyperlipidemia, unspecified: Secondary | ICD-10-CM | POA: Diagnosis not present

## 2017-07-02 DIAGNOSIS — E119 Type 2 diabetes mellitus without complications: Secondary | ICD-10-CM | POA: Diagnosis not present

## 2017-07-02 DIAGNOSIS — I10 Essential (primary) hypertension: Secondary | ICD-10-CM | POA: Diagnosis not present

## 2017-07-02 DIAGNOSIS — D649 Anemia, unspecified: Secondary | ICD-10-CM | POA: Diagnosis not present

## 2017-07-02 DIAGNOSIS — Z79899 Other long term (current) drug therapy: Secondary | ICD-10-CM | POA: Diagnosis not present

## 2017-07-17 DIAGNOSIS — F0281 Dementia in other diseases classified elsewhere with behavioral disturbance: Secondary | ICD-10-CM | POA: Diagnosis not present

## 2017-07-17 DIAGNOSIS — I129 Hypertensive chronic kidney disease with stage 1 through stage 4 chronic kidney disease, or unspecified chronic kidney disease: Secondary | ICD-10-CM | POA: Diagnosis not present

## 2017-07-17 DIAGNOSIS — I509 Heart failure, unspecified: Secondary | ICD-10-CM | POA: Diagnosis not present

## 2017-07-17 DIAGNOSIS — J449 Chronic obstructive pulmonary disease, unspecified: Secondary | ICD-10-CM | POA: Diagnosis not present

## 2017-07-27 DIAGNOSIS — I130 Hypertensive heart and chronic kidney disease with heart failure and stage 1 through stage 4 chronic kidney disease, or unspecified chronic kidney disease: Secondary | ICD-10-CM | POA: Diagnosis not present

## 2017-07-27 DIAGNOSIS — I509 Heart failure, unspecified: Secondary | ICD-10-CM | POA: Diagnosis not present

## 2017-07-27 DIAGNOSIS — N183 Chronic kidney disease, stage 3 (moderate): Secondary | ICD-10-CM | POA: Diagnosis not present

## 2017-07-27 DIAGNOSIS — D631 Anemia in chronic kidney disease: Secondary | ICD-10-CM | POA: Diagnosis not present

## 2017-08-08 DIAGNOSIS — I10 Essential (primary) hypertension: Secondary | ICD-10-CM | POA: Diagnosis not present

## 2017-08-21 DIAGNOSIS — F0281 Dementia in other diseases classified elsewhere with behavioral disturbance: Secondary | ICD-10-CM | POA: Diagnosis not present

## 2017-08-21 DIAGNOSIS — N400 Enlarged prostate without lower urinary tract symptoms: Secondary | ICD-10-CM | POA: Diagnosis not present

## 2017-08-21 DIAGNOSIS — N183 Chronic kidney disease, stage 3 (moderate): Secondary | ICD-10-CM | POA: Diagnosis not present

## 2017-08-21 DIAGNOSIS — I10 Essential (primary) hypertension: Secondary | ICD-10-CM | POA: Diagnosis not present

## 2017-08-24 DIAGNOSIS — F0281 Dementia in other diseases classified elsewhere with behavioral disturbance: Secondary | ICD-10-CM | POA: Diagnosis not present

## 2017-08-24 DIAGNOSIS — I509 Heart failure, unspecified: Secondary | ICD-10-CM | POA: Diagnosis not present

## 2017-08-24 DIAGNOSIS — I10 Essential (primary) hypertension: Secondary | ICD-10-CM | POA: Diagnosis not present

## 2017-08-24 DIAGNOSIS — J449 Chronic obstructive pulmonary disease, unspecified: Secondary | ICD-10-CM | POA: Diagnosis not present

## 2017-09-17 DIAGNOSIS — F0390 Unspecified dementia without behavioral disturbance: Secondary | ICD-10-CM | POA: Diagnosis not present

## 2017-09-17 DIAGNOSIS — J449 Chronic obstructive pulmonary disease, unspecified: Secondary | ICD-10-CM | POA: Diagnosis not present

## 2017-09-17 DIAGNOSIS — I129 Hypertensive chronic kidney disease with stage 1 through stage 4 chronic kidney disease, or unspecified chronic kidney disease: Secondary | ICD-10-CM | POA: Diagnosis not present

## 2017-09-18 DIAGNOSIS — D649 Anemia, unspecified: Secondary | ICD-10-CM | POA: Diagnosis not present

## 2017-09-18 DIAGNOSIS — R071 Chest pain on breathing: Secondary | ICD-10-CM | POA: Diagnosis not present

## 2017-09-20 DIAGNOSIS — M6281 Muscle weakness (generalized): Secondary | ICD-10-CM | POA: Diagnosis not present

## 2017-09-20 DIAGNOSIS — D519 Vitamin B12 deficiency anemia, unspecified: Secondary | ICD-10-CM | POA: Diagnosis not present

## 2017-09-20 DIAGNOSIS — D649 Anemia, unspecified: Secondary | ICD-10-CM | POA: Diagnosis not present

## 2017-09-21 DIAGNOSIS — I509 Heart failure, unspecified: Secondary | ICD-10-CM | POA: Diagnosis not present

## 2017-09-21 DIAGNOSIS — N183 Chronic kidney disease, stage 3 (moderate): Secondary | ICD-10-CM | POA: Diagnosis not present

## 2017-09-21 DIAGNOSIS — D631 Anemia in chronic kidney disease: Secondary | ICD-10-CM | POA: Diagnosis not present

## 2017-09-21 DIAGNOSIS — I130 Hypertensive heart and chronic kidney disease with heart failure and stage 1 through stage 4 chronic kidney disease, or unspecified chronic kidney disease: Secondary | ICD-10-CM | POA: Diagnosis not present

## 2017-09-25 DIAGNOSIS — R079 Chest pain, unspecified: Secondary | ICD-10-CM | POA: Diagnosis not present

## 2017-10-04 DIAGNOSIS — F329 Major depressive disorder, single episode, unspecified: Secondary | ICD-10-CM | POA: Diagnosis not present

## 2017-10-04 DIAGNOSIS — I509 Heart failure, unspecified: Secondary | ICD-10-CM | POA: Diagnosis not present

## 2017-10-04 DIAGNOSIS — D649 Anemia, unspecified: Secondary | ICD-10-CM | POA: Diagnosis not present

## 2017-10-04 DIAGNOSIS — I639 Cerebral infarction, unspecified: Secondary | ICD-10-CM | POA: Diagnosis not present

## 2017-10-05 DIAGNOSIS — Z86010 Personal history of colonic polyps: Secondary | ICD-10-CM | POA: Diagnosis not present

## 2017-10-05 DIAGNOSIS — Z23 Encounter for immunization: Secondary | ICD-10-CM | POA: Diagnosis not present

## 2017-10-05 DIAGNOSIS — D649 Anemia, unspecified: Secondary | ICD-10-CM | POA: Diagnosis not present

## 2017-10-09 DIAGNOSIS — E785 Hyperlipidemia, unspecified: Secondary | ICD-10-CM | POA: Diagnosis not present

## 2017-10-09 DIAGNOSIS — E7849 Other hyperlipidemia: Secondary | ICD-10-CM | POA: Diagnosis not present

## 2017-10-09 DIAGNOSIS — E119 Type 2 diabetes mellitus without complications: Secondary | ICD-10-CM | POA: Diagnosis not present

## 2017-10-09 DIAGNOSIS — Z79899 Other long term (current) drug therapy: Secondary | ICD-10-CM | POA: Diagnosis not present

## 2017-10-15 DIAGNOSIS — I10 Essential (primary) hypertension: Secondary | ICD-10-CM | POA: Diagnosis not present

## 2017-10-15 DIAGNOSIS — I639 Cerebral infarction, unspecified: Secondary | ICD-10-CM | POA: Diagnosis not present

## 2017-10-15 DIAGNOSIS — I129 Hypertensive chronic kidney disease with stage 1 through stage 4 chronic kidney disease, or unspecified chronic kidney disease: Secondary | ICD-10-CM | POA: Diagnosis not present

## 2017-10-15 DIAGNOSIS — J449 Chronic obstructive pulmonary disease, unspecified: Secondary | ICD-10-CM | POA: Diagnosis not present

## 2017-10-19 DIAGNOSIS — E785 Hyperlipidemia, unspecified: Secondary | ICD-10-CM | POA: Diagnosis not present

## 2017-10-19 DIAGNOSIS — I509 Heart failure, unspecified: Secondary | ICD-10-CM | POA: Diagnosis not present

## 2017-10-19 DIAGNOSIS — N183 Chronic kidney disease, stage 3 (moderate): Secondary | ICD-10-CM | POA: Diagnosis not present

## 2017-10-19 DIAGNOSIS — I130 Hypertensive heart and chronic kidney disease with heart failure and stage 1 through stage 4 chronic kidney disease, or unspecified chronic kidney disease: Secondary | ICD-10-CM | POA: Diagnosis not present

## 2017-11-15 DIAGNOSIS — I129 Hypertensive chronic kidney disease with stage 1 through stage 4 chronic kidney disease, or unspecified chronic kidney disease: Secondary | ICD-10-CM | POA: Diagnosis not present

## 2017-11-15 DIAGNOSIS — J449 Chronic obstructive pulmonary disease, unspecified: Secondary | ICD-10-CM | POA: Diagnosis not present

## 2017-11-15 DIAGNOSIS — I10 Essential (primary) hypertension: Secondary | ICD-10-CM | POA: Diagnosis not present

## 2017-11-15 DIAGNOSIS — I509 Heart failure, unspecified: Secondary | ICD-10-CM | POA: Diagnosis not present

## 2017-11-16 DIAGNOSIS — I10 Essential (primary) hypertension: Secondary | ICD-10-CM | POA: Diagnosis not present

## 2017-11-16 DIAGNOSIS — I509 Heart failure, unspecified: Secondary | ICD-10-CM | POA: Diagnosis not present

## 2017-11-16 DIAGNOSIS — I129 Hypertensive chronic kidney disease with stage 1 through stage 4 chronic kidney disease, or unspecified chronic kidney disease: Secondary | ICD-10-CM | POA: Diagnosis not present

## 2017-12-18 DIAGNOSIS — N400 Enlarged prostate without lower urinary tract symptoms: Secondary | ICD-10-CM | POA: Diagnosis not present

## 2017-12-18 DIAGNOSIS — I129 Hypertensive chronic kidney disease with stage 1 through stage 4 chronic kidney disease, or unspecified chronic kidney disease: Secondary | ICD-10-CM | POA: Diagnosis not present

## 2017-12-18 DIAGNOSIS — I509 Heart failure, unspecified: Secondary | ICD-10-CM | POA: Diagnosis not present

## 2017-12-18 DIAGNOSIS — J449 Chronic obstructive pulmonary disease, unspecified: Secondary | ICD-10-CM | POA: Diagnosis not present

## 2017-12-21 DIAGNOSIS — E785 Hyperlipidemia, unspecified: Secondary | ICD-10-CM | POA: Diagnosis not present

## 2017-12-21 DIAGNOSIS — I509 Heart failure, unspecified: Secondary | ICD-10-CM | POA: Diagnosis not present

## 2017-12-21 DIAGNOSIS — N183 Chronic kidney disease, stage 3 (moderate): Secondary | ICD-10-CM | POA: Diagnosis not present

## 2017-12-21 DIAGNOSIS — I10 Essential (primary) hypertension: Secondary | ICD-10-CM | POA: Diagnosis not present

## 2018-01-03 DIAGNOSIS — Z79899 Other long term (current) drug therapy: Secondary | ICD-10-CM | POA: Diagnosis not present

## 2018-01-03 DIAGNOSIS — E119 Type 2 diabetes mellitus without complications: Secondary | ICD-10-CM | POA: Diagnosis not present

## 2018-01-03 DIAGNOSIS — I10 Essential (primary) hypertension: Secondary | ICD-10-CM | POA: Diagnosis not present

## 2018-01-18 DIAGNOSIS — I509 Heart failure, unspecified: Secondary | ICD-10-CM | POA: Diagnosis not present

## 2018-01-18 DIAGNOSIS — I10 Essential (primary) hypertension: Secondary | ICD-10-CM | POA: Diagnosis not present

## 2018-01-18 DIAGNOSIS — N183 Chronic kidney disease, stage 3 (moderate): Secondary | ICD-10-CM | POA: Diagnosis not present

## 2018-01-18 DIAGNOSIS — J449 Chronic obstructive pulmonary disease, unspecified: Secondary | ICD-10-CM | POA: Diagnosis not present

## 2018-01-21 DIAGNOSIS — I10 Essential (primary) hypertension: Secondary | ICD-10-CM | POA: Diagnosis not present

## 2018-01-21 DIAGNOSIS — N400 Enlarged prostate without lower urinary tract symptoms: Secondary | ICD-10-CM | POA: Diagnosis not present

## 2018-01-21 DIAGNOSIS — J449 Chronic obstructive pulmonary disease, unspecified: Secondary | ICD-10-CM | POA: Diagnosis not present

## 2018-01-21 DIAGNOSIS — F0390 Unspecified dementia without behavioral disturbance: Secondary | ICD-10-CM | POA: Diagnosis not present

## 2018-02-15 DIAGNOSIS — J449 Chronic obstructive pulmonary disease, unspecified: Secondary | ICD-10-CM | POA: Diagnosis not present

## 2018-02-15 DIAGNOSIS — I509 Heart failure, unspecified: Secondary | ICD-10-CM | POA: Diagnosis not present

## 2018-02-15 DIAGNOSIS — N183 Chronic kidney disease, stage 3 (moderate): Secondary | ICD-10-CM | POA: Diagnosis not present

## 2018-02-15 DIAGNOSIS — E785 Hyperlipidemia, unspecified: Secondary | ICD-10-CM | POA: Diagnosis not present

## 2018-02-18 DIAGNOSIS — I10 Essential (primary) hypertension: Secondary | ICD-10-CM | POA: Diagnosis not present

## 2018-02-18 DIAGNOSIS — I129 Hypertensive chronic kidney disease with stage 1 through stage 4 chronic kidney disease, or unspecified chronic kidney disease: Secondary | ICD-10-CM | POA: Diagnosis not present

## 2018-02-18 DIAGNOSIS — I509 Heart failure, unspecified: Secondary | ICD-10-CM | POA: Diagnosis not present

## 2018-02-18 DIAGNOSIS — J449 Chronic obstructive pulmonary disease, unspecified: Secondary | ICD-10-CM | POA: Diagnosis not present

## 2018-03-01 DIAGNOSIS — R05 Cough: Secondary | ICD-10-CM | POA: Diagnosis not present

## 2018-03-01 DIAGNOSIS — D649 Anemia, unspecified: Secondary | ICD-10-CM | POA: Diagnosis not present

## 2018-03-01 DIAGNOSIS — I10 Essential (primary) hypertension: Secondary | ICD-10-CM | POA: Diagnosis not present

## 2018-03-04 DIAGNOSIS — D649 Anemia, unspecified: Secondary | ICD-10-CM | POA: Diagnosis not present

## 2018-03-04 DIAGNOSIS — I10 Essential (primary) hypertension: Secondary | ICD-10-CM | POA: Diagnosis not present

## 2018-03-08 DIAGNOSIS — I10 Essential (primary) hypertension: Secondary | ICD-10-CM | POA: Diagnosis not present

## 2018-03-09 DIAGNOSIS — Z79899 Other long term (current) drug therapy: Secondary | ICD-10-CM | POA: Diagnosis not present

## 2018-03-09 DIAGNOSIS — D519 Vitamin B12 deficiency anemia, unspecified: Secondary | ICD-10-CM | POA: Diagnosis not present

## 2018-03-09 DIAGNOSIS — D649 Anemia, unspecified: Secondary | ICD-10-CM | POA: Diagnosis not present

## 2018-03-15 DIAGNOSIS — N183 Chronic kidney disease, stage 3 (moderate): Secondary | ICD-10-CM | POA: Diagnosis not present

## 2018-03-15 DIAGNOSIS — I10 Essential (primary) hypertension: Secondary | ICD-10-CM | POA: Diagnosis not present

## 2018-03-15 DIAGNOSIS — I509 Heart failure, unspecified: Secondary | ICD-10-CM | POA: Diagnosis not present

## 2018-03-15 DIAGNOSIS — J449 Chronic obstructive pulmonary disease, unspecified: Secondary | ICD-10-CM | POA: Diagnosis not present

## 2018-03-18 DIAGNOSIS — K219 Gastro-esophageal reflux disease without esophagitis: Secondary | ICD-10-CM | POA: Diagnosis not present

## 2018-03-18 DIAGNOSIS — I129 Hypertensive chronic kidney disease with stage 1 through stage 4 chronic kidney disease, or unspecified chronic kidney disease: Secondary | ICD-10-CM | POA: Diagnosis not present

## 2018-03-18 DIAGNOSIS — G4700 Insomnia, unspecified: Secondary | ICD-10-CM | POA: Diagnosis not present

## 2018-03-18 DIAGNOSIS — N183 Chronic kidney disease, stage 3 (moderate): Secondary | ICD-10-CM | POA: Diagnosis not present

## 2018-03-29 DIAGNOSIS — Z79899 Other long term (current) drug therapy: Secondary | ICD-10-CM | POA: Diagnosis not present

## 2018-04-11 DIAGNOSIS — D649 Anemia, unspecified: Secondary | ICD-10-CM | POA: Diagnosis not present

## 2018-04-11 DIAGNOSIS — M6281 Muscle weakness (generalized): Secondary | ICD-10-CM | POA: Diagnosis not present

## 2018-04-15 DIAGNOSIS — I509 Heart failure, unspecified: Secondary | ICD-10-CM | POA: Diagnosis not present

## 2018-04-15 DIAGNOSIS — E785 Hyperlipidemia, unspecified: Secondary | ICD-10-CM | POA: Diagnosis not present

## 2018-04-15 DIAGNOSIS — I129 Hypertensive chronic kidney disease with stage 1 through stage 4 chronic kidney disease, or unspecified chronic kidney disease: Secondary | ICD-10-CM | POA: Diagnosis not present

## 2018-04-15 DIAGNOSIS — J449 Chronic obstructive pulmonary disease, unspecified: Secondary | ICD-10-CM | POA: Diagnosis not present

## 2018-04-22 DIAGNOSIS — J449 Chronic obstructive pulmonary disease, unspecified: Secondary | ICD-10-CM | POA: Diagnosis not present

## 2018-04-22 DIAGNOSIS — I509 Heart failure, unspecified: Secondary | ICD-10-CM | POA: Diagnosis not present

## 2018-04-22 DIAGNOSIS — N400 Enlarged prostate without lower urinary tract symptoms: Secondary | ICD-10-CM | POA: Diagnosis not present

## 2018-04-22 DIAGNOSIS — I129 Hypertensive chronic kidney disease with stage 1 through stage 4 chronic kidney disease, or unspecified chronic kidney disease: Secondary | ICD-10-CM | POA: Diagnosis not present

## 2018-04-29 DIAGNOSIS — D649 Anemia, unspecified: Secondary | ICD-10-CM | POA: Diagnosis not present

## 2018-04-29 DIAGNOSIS — M6281 Muscle weakness (generalized): Secondary | ICD-10-CM | POA: Diagnosis not present

## 2018-05-10 DIAGNOSIS — J449 Chronic obstructive pulmonary disease, unspecified: Secondary | ICD-10-CM | POA: Diagnosis not present

## 2018-05-10 DIAGNOSIS — I129 Hypertensive chronic kidney disease with stage 1 through stage 4 chronic kidney disease, or unspecified chronic kidney disease: Secondary | ICD-10-CM | POA: Diagnosis not present

## 2018-05-10 DIAGNOSIS — E785 Hyperlipidemia, unspecified: Secondary | ICD-10-CM | POA: Diagnosis not present

## 2018-05-10 DIAGNOSIS — I509 Heart failure, unspecified: Secondary | ICD-10-CM | POA: Diagnosis not present

## 2018-05-24 DIAGNOSIS — M6281 Muscle weakness (generalized): Secondary | ICD-10-CM | POA: Diagnosis not present

## 2018-05-24 DIAGNOSIS — D649 Anemia, unspecified: Secondary | ICD-10-CM | POA: Diagnosis not present

## 2018-05-27 DIAGNOSIS — D631 Anemia in chronic kidney disease: Secondary | ICD-10-CM | POA: Diagnosis not present

## 2018-05-27 DIAGNOSIS — N400 Enlarged prostate without lower urinary tract symptoms: Secondary | ICD-10-CM | POA: Diagnosis not present

## 2018-05-27 DIAGNOSIS — I10 Essential (primary) hypertension: Secondary | ICD-10-CM | POA: Diagnosis not present

## 2018-05-27 DIAGNOSIS — J449 Chronic obstructive pulmonary disease, unspecified: Secondary | ICD-10-CM | POA: Diagnosis not present

## 2018-06-07 DIAGNOSIS — J449 Chronic obstructive pulmonary disease, unspecified: Secondary | ICD-10-CM | POA: Diagnosis not present

## 2018-06-07 DIAGNOSIS — I129 Hypertensive chronic kidney disease with stage 1 through stage 4 chronic kidney disease, or unspecified chronic kidney disease: Secondary | ICD-10-CM | POA: Diagnosis not present

## 2018-06-07 DIAGNOSIS — E785 Hyperlipidemia, unspecified: Secondary | ICD-10-CM | POA: Diagnosis not present

## 2018-06-07 DIAGNOSIS — I509 Heart failure, unspecified: Secondary | ICD-10-CM | POA: Diagnosis not present

## 2018-06-12 DIAGNOSIS — Z79899 Other long term (current) drug therapy: Secondary | ICD-10-CM | POA: Diagnosis not present

## 2018-06-12 DIAGNOSIS — E119 Type 2 diabetes mellitus without complications: Secondary | ICD-10-CM | POA: Diagnosis not present

## 2018-06-19 DIAGNOSIS — D649 Anemia, unspecified: Secondary | ICD-10-CM | POA: Diagnosis not present

## 2018-06-19 DIAGNOSIS — I10 Essential (primary) hypertension: Secondary | ICD-10-CM | POA: Diagnosis not present

## 2018-06-24 DIAGNOSIS — I129 Hypertensive chronic kidney disease with stage 1 through stage 4 chronic kidney disease, or unspecified chronic kidney disease: Secondary | ICD-10-CM | POA: Diagnosis not present

## 2018-06-24 DIAGNOSIS — I10 Essential (primary) hypertension: Secondary | ICD-10-CM | POA: Diagnosis not present

## 2018-06-24 DIAGNOSIS — J449 Chronic obstructive pulmonary disease, unspecified: Secondary | ICD-10-CM | POA: Diagnosis not present

## 2018-06-24 DIAGNOSIS — I509 Heart failure, unspecified: Secondary | ICD-10-CM | POA: Diagnosis not present

## 2018-07-08 DIAGNOSIS — I509 Heart failure, unspecified: Secondary | ICD-10-CM | POA: Diagnosis not present

## 2018-07-08 DIAGNOSIS — J449 Chronic obstructive pulmonary disease, unspecified: Secondary | ICD-10-CM | POA: Diagnosis not present

## 2018-07-08 DIAGNOSIS — I10 Essential (primary) hypertension: Secondary | ICD-10-CM | POA: Diagnosis not present

## 2018-07-08 DIAGNOSIS — N183 Chronic kidney disease, stage 3 (moderate): Secondary | ICD-10-CM | POA: Diagnosis not present

## 2018-07-24 DIAGNOSIS — F0390 Unspecified dementia without behavioral disturbance: Secondary | ICD-10-CM | POA: Diagnosis not present

## 2018-07-24 DIAGNOSIS — I129 Hypertensive chronic kidney disease with stage 1 through stage 4 chronic kidney disease, or unspecified chronic kidney disease: Secondary | ICD-10-CM | POA: Diagnosis not present

## 2018-07-24 DIAGNOSIS — I10 Essential (primary) hypertension: Secondary | ICD-10-CM | POA: Diagnosis not present

## 2018-07-24 DIAGNOSIS — J449 Chronic obstructive pulmonary disease, unspecified: Secondary | ICD-10-CM | POA: Diagnosis not present

## 2018-08-06 DIAGNOSIS — I509 Heart failure, unspecified: Secondary | ICD-10-CM | POA: Diagnosis not present

## 2018-08-06 DIAGNOSIS — J449 Chronic obstructive pulmonary disease, unspecified: Secondary | ICD-10-CM | POA: Diagnosis not present

## 2018-08-06 DIAGNOSIS — I10 Essential (primary) hypertension: Secondary | ICD-10-CM | POA: Diagnosis not present

## 2018-08-06 DIAGNOSIS — N183 Chronic kidney disease, stage 3 (moderate): Secondary | ICD-10-CM | POA: Diagnosis not present

## 2018-08-19 DIAGNOSIS — Z20828 Contact with and (suspected) exposure to other viral communicable diseases: Secondary | ICD-10-CM | POA: Diagnosis not present

## 2018-08-19 DIAGNOSIS — Z1159 Encounter for screening for other viral diseases: Secondary | ICD-10-CM | POA: Diagnosis not present

## 2018-08-26 DIAGNOSIS — J449 Chronic obstructive pulmonary disease, unspecified: Secondary | ICD-10-CM | POA: Diagnosis not present

## 2018-08-26 DIAGNOSIS — I129 Hypertensive chronic kidney disease with stage 1 through stage 4 chronic kidney disease, or unspecified chronic kidney disease: Secondary | ICD-10-CM | POA: Diagnosis not present

## 2018-08-26 DIAGNOSIS — I10 Essential (primary) hypertension: Secondary | ICD-10-CM | POA: Diagnosis not present

## 2018-08-26 DIAGNOSIS — N400 Enlarged prostate without lower urinary tract symptoms: Secondary | ICD-10-CM | POA: Diagnosis not present

## 2018-09-02 DIAGNOSIS — J449 Chronic obstructive pulmonary disease, unspecified: Secondary | ICD-10-CM | POA: Diagnosis not present

## 2018-09-02 DIAGNOSIS — F0281 Dementia in other diseases classified elsewhere with behavioral disturbance: Secondary | ICD-10-CM | POA: Diagnosis not present

## 2018-09-02 DIAGNOSIS — I10 Essential (primary) hypertension: Secondary | ICD-10-CM | POA: Diagnosis not present

## 2018-09-02 DIAGNOSIS — I509 Heart failure, unspecified: Secondary | ICD-10-CM | POA: Diagnosis not present

## 2018-09-13 DIAGNOSIS — I129 Hypertensive chronic kidney disease with stage 1 through stage 4 chronic kidney disease, or unspecified chronic kidney disease: Secondary | ICD-10-CM | POA: Diagnosis not present

## 2018-09-13 DIAGNOSIS — E119 Type 2 diabetes mellitus without complications: Secondary | ICD-10-CM | POA: Diagnosis not present

## 2018-09-23 DIAGNOSIS — I509 Heart failure, unspecified: Secondary | ICD-10-CM | POA: Diagnosis not present

## 2018-09-23 DIAGNOSIS — I129 Hypertensive chronic kidney disease with stage 1 through stage 4 chronic kidney disease, or unspecified chronic kidney disease: Secondary | ICD-10-CM | POA: Diagnosis not present

## 2018-09-23 DIAGNOSIS — N183 Chronic kidney disease, stage 3 (moderate): Secondary | ICD-10-CM | POA: Diagnosis not present

## 2018-09-23 DIAGNOSIS — J449 Chronic obstructive pulmonary disease, unspecified: Secondary | ICD-10-CM | POA: Diagnosis not present

## 2018-09-30 DIAGNOSIS — J449 Chronic obstructive pulmonary disease, unspecified: Secondary | ICD-10-CM | POA: Diagnosis not present

## 2018-09-30 DIAGNOSIS — I129 Hypertensive chronic kidney disease with stage 1 through stage 4 chronic kidney disease, or unspecified chronic kidney disease: Secondary | ICD-10-CM | POA: Diagnosis not present

## 2018-09-30 DIAGNOSIS — I509 Heart failure, unspecified: Secondary | ICD-10-CM | POA: Diagnosis not present

## 2018-09-30 DIAGNOSIS — I10 Essential (primary) hypertension: Secondary | ICD-10-CM | POA: Diagnosis not present

## 2018-10-11 DIAGNOSIS — E785 Hyperlipidemia, unspecified: Secondary | ICD-10-CM | POA: Diagnosis not present

## 2018-10-11 DIAGNOSIS — E7849 Other hyperlipidemia: Secondary | ICD-10-CM | POA: Diagnosis not present

## 2018-10-11 DIAGNOSIS — Z79899 Other long term (current) drug therapy: Secondary | ICD-10-CM | POA: Diagnosis not present

## 2018-10-19 DIAGNOSIS — U071 COVID-19: Secondary | ICD-10-CM | POA: Diagnosis not present

## 2018-10-21 DIAGNOSIS — F0390 Unspecified dementia without behavioral disturbance: Secondary | ICD-10-CM | POA: Diagnosis not present

## 2018-10-21 DIAGNOSIS — I129 Hypertensive chronic kidney disease with stage 1 through stage 4 chronic kidney disease, or unspecified chronic kidney disease: Secondary | ICD-10-CM | POA: Diagnosis not present

## 2018-10-21 DIAGNOSIS — I10 Essential (primary) hypertension: Secondary | ICD-10-CM | POA: Diagnosis not present

## 2018-10-21 DIAGNOSIS — J449 Chronic obstructive pulmonary disease, unspecified: Secondary | ICD-10-CM | POA: Diagnosis not present

## 2018-10-28 DIAGNOSIS — J449 Chronic obstructive pulmonary disease, unspecified: Secondary | ICD-10-CM | POA: Diagnosis not present

## 2018-10-28 DIAGNOSIS — E785 Hyperlipidemia, unspecified: Secondary | ICD-10-CM | POA: Diagnosis not present

## 2018-10-28 DIAGNOSIS — I509 Heart failure, unspecified: Secondary | ICD-10-CM | POA: Diagnosis not present

## 2018-10-28 DIAGNOSIS — I129 Hypertensive chronic kidney disease with stage 1 through stage 4 chronic kidney disease, or unspecified chronic kidney disease: Secondary | ICD-10-CM | POA: Diagnosis not present

## 2018-11-12 DIAGNOSIS — J449 Chronic obstructive pulmonary disease, unspecified: Secondary | ICD-10-CM | POA: Diagnosis not present

## 2018-11-12 DIAGNOSIS — I129 Hypertensive chronic kidney disease with stage 1 through stage 4 chronic kidney disease, or unspecified chronic kidney disease: Secondary | ICD-10-CM | POA: Diagnosis not present

## 2018-11-12 DIAGNOSIS — I10 Essential (primary) hypertension: Secondary | ICD-10-CM | POA: Diagnosis not present

## 2018-11-12 DIAGNOSIS — N400 Enlarged prostate without lower urinary tract symptoms: Secondary | ICD-10-CM | POA: Diagnosis not present

## 2018-11-25 DIAGNOSIS — Z20828 Contact with and (suspected) exposure to other viral communicable diseases: Secondary | ICD-10-CM | POA: Diagnosis not present

## 2018-11-28 DIAGNOSIS — Z20828 Contact with and (suspected) exposure to other viral communicable diseases: Secondary | ICD-10-CM | POA: Diagnosis not present

## 2019-02-03 DEATH — deceased
